# Patient Record
Sex: Male | Born: 1935 | Race: White | Hispanic: No | Smoking: Former smoker
Health system: Southern US, Community
[De-identification: ages and names within clinical notes are randomized; demographics above are authoritative.]

## PROBLEM LIST (undated history)

## (undated) DIAGNOSIS — K219 Gastro-esophageal reflux disease without esophagitis: Secondary | ICD-10-CM

## (undated) DIAGNOSIS — M4800 Spinal stenosis, site unspecified: Secondary | ICD-10-CM

## (undated) DIAGNOSIS — H269 Unspecified cataract: Secondary | ICD-10-CM

## (undated) DIAGNOSIS — C679 Malignant neoplasm of bladder, unspecified: Secondary | ICD-10-CM

## (undated) DIAGNOSIS — I10 Essential (primary) hypertension: Secondary | ICD-10-CM

## (undated) DIAGNOSIS — T7840XA Allergy, unspecified, initial encounter: Secondary | ICD-10-CM

## (undated) DIAGNOSIS — Z87442 Personal history of urinary calculi: Secondary | ICD-10-CM

## (undated) DIAGNOSIS — M109 Gout, unspecified: Secondary | ICD-10-CM

## (undated) DIAGNOSIS — I499 Cardiac arrhythmia, unspecified: Secondary | ICD-10-CM

## (undated) DIAGNOSIS — N189 Chronic kidney disease, unspecified: Secondary | ICD-10-CM

## (undated) DIAGNOSIS — C801 Malignant (primary) neoplasm, unspecified: Secondary | ICD-10-CM

## (undated) DIAGNOSIS — M1990 Unspecified osteoarthritis, unspecified site: Secondary | ICD-10-CM

## (undated) DIAGNOSIS — G629 Polyneuropathy, unspecified: Secondary | ICD-10-CM

## (undated) HISTORY — PX: TONSILLECTOMY: SUR1361

## (undated) HISTORY — DX: Allergy, unspecified, initial encounter: T78.40XA

## (undated) HISTORY — PX: LUMBAR LAMINECTOMY: SHX95

## (undated) HISTORY — PX: CERVICAL SPINE SURGERY: SHX589

## (undated) HISTORY — DX: Unspecified cataract: H26.9

## (undated) HISTORY — DX: Gout, unspecified: M10.9

## (undated) HISTORY — DX: Unspecified osteoarthritis, unspecified site: M19.90

## (undated) HISTORY — PX: CHOLECYSTECTOMY: SHX55

## (undated) HISTORY — PX: EYE SURGERY: SHX253

## (undated) HISTORY — PX: JOINT REPLACEMENT: SHX530

## (undated) HISTORY — DX: Malignant neoplasm of bladder, unspecified: C67.9

## (undated) HISTORY — PX: PROSTATE SURGERY: SHX751

## (undated) HISTORY — PX: BACK SURGERY: SHX140

## (undated) HISTORY — DX: Gastro-esophageal reflux disease without esophagitis: K21.9

## (undated) HISTORY — DX: Chronic kidney disease, unspecified: N18.9

## (undated) HISTORY — DX: Essential (primary) hypertension: I10

---

## 2004-07-11 ENCOUNTER — Other Ambulatory Visit: Payer: Self-pay

## 2004-07-11 ENCOUNTER — Emergency Department: Payer: Self-pay | Admitting: Unknown Physician Specialty

## 2005-02-20 ENCOUNTER — Ambulatory Visit: Payer: Self-pay | Admitting: Podiatry

## 2005-02-22 ENCOUNTER — Inpatient Hospital Stay: Payer: Self-pay | Admitting: Podiatry

## 2005-09-08 ENCOUNTER — Ambulatory Visit: Payer: Self-pay | Admitting: Unknown Physician Specialty

## 2005-12-10 ENCOUNTER — Ambulatory Visit: Payer: Self-pay | Admitting: Infectious Diseases

## 2006-03-23 ENCOUNTER — Ambulatory Visit: Payer: Self-pay | Admitting: Podiatry

## 2006-03-23 ENCOUNTER — Other Ambulatory Visit: Payer: Self-pay

## 2006-03-25 ENCOUNTER — Ambulatory Visit: Payer: Self-pay | Admitting: Podiatry

## 2006-10-19 ENCOUNTER — Ambulatory Visit: Payer: Self-pay | Admitting: Internal Medicine

## 2007-06-21 ENCOUNTER — Ambulatory Visit: Payer: Self-pay | Admitting: Unknown Physician Specialty

## 2007-07-01 ENCOUNTER — Inpatient Hospital Stay: Payer: Self-pay | Admitting: Unknown Physician Specialty

## 2007-07-06 ENCOUNTER — Encounter: Payer: Self-pay | Admitting: Internal Medicine

## 2007-07-11 ENCOUNTER — Encounter: Payer: Self-pay | Admitting: Internal Medicine

## 2008-11-02 ENCOUNTER — Ambulatory Visit: Payer: Self-pay | Admitting: Unknown Physician Specialty

## 2008-12-06 ENCOUNTER — Ambulatory Visit: Payer: Self-pay | Admitting: Podiatry

## 2008-12-08 ENCOUNTER — Ambulatory Visit: Payer: Self-pay | Admitting: Podiatry

## 2011-12-29 ENCOUNTER — Ambulatory Visit: Payer: Self-pay | Admitting: Unknown Physician Specialty

## 2011-12-31 LAB — PATHOLOGY REPORT

## 2014-09-28 DIAGNOSIS — K219 Gastro-esophageal reflux disease without esophagitis: Secondary | ICD-10-CM | POA: Insufficient documentation

## 2014-09-28 DIAGNOSIS — M542 Cervicalgia: Secondary | ICD-10-CM

## 2014-09-28 DIAGNOSIS — J3089 Other allergic rhinitis: Secondary | ICD-10-CM | POA: Insufficient documentation

## 2014-09-28 DIAGNOSIS — G8929 Other chronic pain: Secondary | ICD-10-CM | POA: Insufficient documentation

## 2014-09-28 DIAGNOSIS — M1000 Idiopathic gout, unspecified site: Secondary | ICD-10-CM | POA: Insufficient documentation

## 2014-09-28 DIAGNOSIS — M109 Gout, unspecified: Secondary | ICD-10-CM | POA: Insufficient documentation

## 2014-09-28 DIAGNOSIS — Z9181 History of falling: Secondary | ICD-10-CM | POA: Insufficient documentation

## 2014-09-28 DIAGNOSIS — I10 Essential (primary) hypertension: Secondary | ICD-10-CM | POA: Insufficient documentation

## 2014-09-28 DIAGNOSIS — N1832 Chronic kidney disease, stage 3b: Secondary | ICD-10-CM | POA: Insufficient documentation

## 2015-03-03 DIAGNOSIS — M5030 Other cervical disc degeneration, unspecified cervical region: Secondary | ICD-10-CM | POA: Insufficient documentation

## 2015-04-02 DIAGNOSIS — H353221 Exudative age-related macular degeneration, left eye, with active choroidal neovascularization: Secondary | ICD-10-CM | POA: Insufficient documentation

## 2015-06-07 DIAGNOSIS — H353110 Nonexudative age-related macular degeneration, right eye, stage unspecified: Secondary | ICD-10-CM | POA: Insufficient documentation

## 2015-09-03 DIAGNOSIS — I10 Essential (primary) hypertension: Secondary | ICD-10-CM | POA: Insufficient documentation

## 2016-01-04 ENCOUNTER — Other Ambulatory Visit: Payer: Self-pay | Admitting: Student

## 2016-01-04 DIAGNOSIS — M19011 Primary osteoarthritis, right shoulder: Secondary | ICD-10-CM

## 2016-01-04 DIAGNOSIS — M75101 Unspecified rotator cuff tear or rupture of right shoulder, not specified as traumatic: Secondary | ICD-10-CM

## 2016-01-11 ENCOUNTER — Ambulatory Visit
Admission: RE | Admit: 2016-01-11 | Discharge: 2016-01-11 | Disposition: A | Discharge status: Home / Self Care | Payer: Medicare Other | Source: Ambulatory Visit | Attending: Student | Admitting: Student

## 2016-01-11 DIAGNOSIS — M62511 Muscle wasting and atrophy, not elsewhere classified, right shoulder: Secondary | ICD-10-CM | POA: Diagnosis not present

## 2016-01-11 DIAGNOSIS — M75121 Complete rotator cuff tear or rupture of right shoulder, not specified as traumatic: Secondary | ICD-10-CM | POA: Diagnosis not present

## 2016-01-11 DIAGNOSIS — M19011 Primary osteoarthritis, right shoulder: Secondary | ICD-10-CM | POA: Diagnosis not present

## 2016-01-11 DIAGNOSIS — M25411 Effusion, right shoulder: Secondary | ICD-10-CM | POA: Insufficient documentation

## 2016-01-11 DIAGNOSIS — M75101 Unspecified rotator cuff tear or rupture of right shoulder, not specified as traumatic: Secondary | ICD-10-CM

## 2016-01-11 DIAGNOSIS — M66822 Spontaneous rupture of other tendons, left upper arm: Secondary | ICD-10-CM | POA: Insufficient documentation

## 2016-01-18 ENCOUNTER — Ambulatory Visit: Payer: Self-pay

## 2016-03-07 DIAGNOSIS — R202 Paresthesia of skin: Secondary | ICD-10-CM | POA: Insufficient documentation

## 2016-03-07 DIAGNOSIS — M5416 Radiculopathy, lumbar region: Secondary | ICD-10-CM | POA: Insufficient documentation

## 2016-04-24 DIAGNOSIS — R55 Syncope and collapse: Secondary | ICD-10-CM | POA: Insufficient documentation

## 2016-04-24 DIAGNOSIS — I499 Cardiac arrhythmia, unspecified: Secondary | ICD-10-CM

## 2016-04-24 DIAGNOSIS — I498 Other specified cardiac arrhythmias: Secondary | ICD-10-CM | POA: Insufficient documentation

## 2016-06-10 DIAGNOSIS — M5416 Radiculopathy, lumbar region: Secondary | ICD-10-CM | POA: Insufficient documentation

## 2016-07-16 ENCOUNTER — Other Ambulatory Visit: Payer: Self-pay | Admitting: Internal Medicine

## 2016-07-16 DIAGNOSIS — R310 Gross hematuria: Secondary | ICD-10-CM

## 2016-07-23 ENCOUNTER — Ambulatory Visit (INDEPENDENT_AMBULATORY_CARE_PROVIDER_SITE_OTHER): Payer: Medicare Other | Admitting: Urology

## 2016-07-23 ENCOUNTER — Encounter: Payer: Self-pay | Admitting: Urology

## 2016-07-23 VITALS — BP 105/58 | HR 76 | Ht 76.0 in | Wt 226.0 lb

## 2016-07-23 DIAGNOSIS — N4289 Other specified disorders of prostate: Secondary | ICD-10-CM | POA: Diagnosis not present

## 2016-07-23 DIAGNOSIS — R310 Gross hematuria: Secondary | ICD-10-CM

## 2016-07-23 DIAGNOSIS — N183 Chronic kidney disease, stage 3 unspecified: Secondary | ICD-10-CM

## 2016-07-23 LAB — URINALYSIS, COMPLETE
Bilirubin, UA: NEGATIVE
GLUCOSE, UA: NEGATIVE
Ketones, UA: NEGATIVE
LEUKOCYTES UA: NEGATIVE
Nitrite, UA: NEGATIVE
Specific Gravity, UA: 1.02 (ref 1.005–1.030)
Urobilinogen, Ur: 0.2 mg/dL (ref 0.2–1.0)
pH, UA: 5.5 (ref 5.0–7.5)

## 2016-07-23 LAB — MICROSCOPIC EXAMINATION: RBC, UA: >30 /hpf — AB (ref 0–?)

## 2016-07-23 NOTE — Progress Notes (Signed)
07/23/2016 1:00 PM   Michael Holloway [birth year] [id number]  Referring provider: Glendon Axe, MD [facility] [place], [place] [address]  Chief Complaint  Patient presents with  . Hematuria    New Patient    HPI: 83 year old male referred by for further evaluation of painless gross hematuria. He notes that the blood first started appearing in his urine approximately 10 days ago and lasted for a total of 6 days. Is since resolved. He continues to have microscopic blood in his urine today.  He denies a personal history of previous episodes of gross hematuria. He's never undergone gross hematuria workup in the past.  No urinary syptoms including dysuria.  He doe shave baseline urgency which started 6 months ago without incontinence.  He gets up 1-2x nightly to void.    He has a personal history of kidney stones, passed a stone spontaneously in 1970s but none since.    He has undergone TUNA in the office with Dr. Eliberto Ivory in the 90s.    He does smoking history, 3/4 ppd x 20 years.  Quit in 1980.    He is scheduled for noncontrast CT scan abd/ pelvis ordered by his PCP, Dr. Wallace Keller tomorrow.    He was previously on ASA 81 but stopped when he started seeing blood.    PMH: Past Medical History:  Diagnosis Date  . Arthritis   . Chronic kidney disease   . GERD (gastroesophageal reflux disease)   . Gout   . Hypertension     Surgical History: Past Surgical History:  Procedure Laterality Date  . CERVICAL SPINE SURGERY    . CHOLECYSTECTOMY    . LUMBAR LAMINECTOMY    . PROSTATE SURGERY  1990s   Dr. Eliberto Ivory ,in office procedure    Home Medications:  Allergies as of 07/23/2016      Reactions   Sulfa Antibiotics Rash      Medication List       Accurate as of 07/23/16  1:00 PM. Always use your most recent med list.          amLODipine 10 MG tablet Commonly known as:  NORVASC Take by mouth.   Biotin 10 MG Caps Take by mouth.   CENTRUM ADULTS  PO Take by mouth.   gabapentin 300 MG capsule Commonly known as:  NEURONTIN Take by mouth.   losartan 50 MG tablet Commonly known as:  COZAAR Take by mouth.   metoprolol succinate 25 MG 24 hr tablet Commonly known as:  TOPROL-XL Take by mouth.   omeprazole 20 MG capsule Commonly known as:  PRILOSEC Take by mouth.   UNABLE TO FIND AREDS II EYE VITAMINS - one capsule twice a day (generic for [facility])       Allergies:  Allergies  Allergen Reactions  . Sulfa Antibiotics Rash    Family History: Family History  Problem Relation Age of Onset  . Bladder Cancer Neg Hx   . Kidney cancer Neg Hx   . Prostate cancer Neg Hx     Social History:  reports that he has quit smoking. He has never used smokeless tobacco. He reports that he drinks alcohol. He reports that he does not use drugs.  ROS: UROLOGY Frequent Urination?: Yes Hard to postpone urination?: Yes Burning/pain with urination?: No Get up at night to urinate?: Yes Leakage of urine?: Yes Urine stream starts and stops?: No Trouble starting stream?: No Do you have to strain to urinate?: No Blood in urine?:  Yes Urinary tract infection?: No Sexually transmitted disease?: No Injury to kidneys or bladder?: No Painful intercourse?: No Weak stream?: No Erection problems?: Yes Penile pain?: No  Gastrointestinal Nausea?: No Vomiting?: No Indigestion/heartburn?: No Diarrhea?: No Constipation?: Yes  Constitutional Fever: No Night sweats?: No Weight loss?: No Fatigue?: Yes  Skin Skin rash/lesions?: No Itching?: No  Eyes Blurred vision?: Yes Double vision?: No  Ears/Nose/Throat Sore throat?: No Sinus problems?: No  Hematologic/Lymphatic Swollen glands?: No Easy bruising?: Yes  Cardiovascular Leg swelling?: No Chest pain?: No  Respiratory Cough?: No Shortness of breath?: Yes  Endocrine Excessive thirst?: Yes  Musculoskeletal Back pain?: Yes Joint pain?: No  Neurological Headaches?:  No Dizziness?: Yes  Psychologic Depression?: No Anxiety?: No  Physical Exam: BP (!) 105/58   Pulse 76   Ht 6\' 4"  (1.93 m)   Wt 226 lb (102.5 kg)   BMI 27.51 kg/m   Constitutional:  Alert and oriented, No acute distress. HEENT: [place] AT, moist mucus membranes.  Trachea midline, no masses. Cardiovascular: No clubbing, cyanosis, or edema. Respiratory: Normal respiratory effort, no increased work of breathing. GI: Abdomen is soft, nontender, nondistended, no abdominal masses GU: No CVA tenderness.  Rectal: Normal sphincter tone. Enlarged 50+ cc prostate, firmness diffusely on right-sided gland but no obvious nodules. Left side is unremarkable. Skin: No rashes, bruises or suspicious lesions. Lymph: No cervical or inguinal adenopathy. Neurologic: Grossly intact, no focal deficits, moving all 4 extremities.  Ambulating steadily with a cane. Psychiatric: Normal mood and affect.  Laboratory Data: Cr 1.7 on 07/14/16  PSA 0.31 on 09/28/14  Urinalysis UA today reviewed, greater than 30 red blood cells per high-power field, otherwise unremarkable. See Epic.  Pertinent Imaging: Noncontrast CT scan scheduled for tomorrow.  Assessment & Plan:    1. Gross hematuria We discussed the differential diagnosis for gross hematuria including nephrolithiasis, renal or upper tract tumors, bladder stones, UTIs, or bladder tumors as well as undetermined etiologies.  Per AUA guidelines, I did recommend complete microscopic hematuria evaluation including CT abdomen pelvis, possible urine cytology, and office cystoscopy.  Given his history of CAD, unable to tolerate IV contrast. As such, patient understands that his workup will be incomplete without evaluation of his collecting system/ureters. Given his age, prefer to avoid proceeding to the operating room for retrogrades. He is clear with this plan.  - Urinalysis, Complete  2. CKD (chronic kidney disease) stage 3, GFR 30-59 ml/min Creatinine stable at  baseline, 1.7.  3. Prostate asymmetry Right-sided prostatic firmness appreciated Shannon rectal exam, given his age and multiple medical comorbidities, we'll likely not pursue this Most recent PSA 2 years ago was unremarkable   Return in about 2 weeks (around 08/06/2016) for cysto, f/u CT scan (already ordered by PCP).  Hollice Espy, MD  [facility] [address] [place], [place] [address] [phone]

## 2016-07-24 ENCOUNTER — Ambulatory Visit: Payer: Medicare Other

## 2016-07-24 ENCOUNTER — Ambulatory Visit
Admission: RE | Admit: 2016-07-24 | Discharge: 2016-07-24 | Disposition: A | Discharge status: Home / Self Care | Payer: Medicare Other | Source: Ambulatory Visit | Attending: Internal Medicine | Admitting: Internal Medicine

## 2016-07-24 DIAGNOSIS — I700 Atherosclerosis of aorta: Secondary | ICD-10-CM | POA: Insufficient documentation

## 2016-07-24 DIAGNOSIS — I2510 Atherosclerotic heart disease of native coronary artery without angina pectoris: Secondary | ICD-10-CM | POA: Diagnosis not present

## 2016-07-24 DIAGNOSIS — N281 Cyst of kidney, acquired: Secondary | ICD-10-CM | POA: Insufficient documentation

## 2016-07-24 DIAGNOSIS — I77811 Abdominal aortic ectasia: Secondary | ICD-10-CM | POA: Diagnosis not present

## 2016-07-24 DIAGNOSIS — I517 Cardiomegaly: Secondary | ICD-10-CM | POA: Insufficient documentation

## 2016-07-24 DIAGNOSIS — N200 Calculus of kidney: Secondary | ICD-10-CM | POA: Insufficient documentation

## 2016-07-24 DIAGNOSIS — R310 Gross hematuria: Secondary | ICD-10-CM

## 2016-08-05 ENCOUNTER — Encounter: Payer: Self-pay | Admitting: Urology

## 2016-08-05 ENCOUNTER — Ambulatory Visit (INDEPENDENT_AMBULATORY_CARE_PROVIDER_SITE_OTHER): Payer: Medicare Other | Admitting: Urology

## 2016-08-05 VITALS — BP 109/55 | HR 75 | Ht 75.0 in | Wt 228.0 lb

## 2016-08-05 DIAGNOSIS — D494 Neoplasm of unspecified behavior of bladder: Secondary | ICD-10-CM

## 2016-08-05 LAB — URINALYSIS, COMPLETE
BILIRUBIN UA: NEGATIVE
Glucose, UA: NEGATIVE
LEUKOCYTES UA: NEGATIVE
Nitrite, UA: NEGATIVE
PH UA: 5 (ref 5.0–7.5)
SPEC GRAV UA: 1.02 (ref 1.005–1.030)
Urobilinogen, Ur: 0.2 mg/dL (ref 0.2–1.0)

## 2016-08-05 LAB — MICROSCOPIC EXAMINATION

## 2016-08-05 MED ORDER — CIPROFLOXACIN HCL 500 MG PO TABS
500.0000 mg | ORAL_TABLET | Freq: Once | ORAL | Status: AC
  Administered 2016-08-05: 500 mg via ORAL

## 2016-08-05 MED ORDER — LIDOCAINE HCL 2 % EX GEL
1.0000 "application " | Freq: Once | CUTANEOUS | Status: AC
  Administered 2016-08-05: 1 via URETHRAL

## 2016-08-05 NOTE — Progress Notes (Signed)
   08/05/16  CC: cysto  HPI:  83 year old male with gross hematuria who presents today for cystoscopy to complete his hematuria workup. He previously underwent a noncontrast CT scan is unable to tolerate IV contrast due to his elevated creatinine. He is not interested in proceeding to the operating room for bilateral retrograde pyelogram.  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    Blood pressure (!) 109/55, pulse 75, height 6\' 3"  (1.905 m), weight 228 lb (103.4 kg). NED. A&Ox3.   No respiratory distress   Abd soft, NT, ND Normal phallus with bilateral descended testicles  Cystoscopy Procedure Note  Patient identification was confirmed, informed consent was obtained, and patient was prepped using Betadine solution.  Lidocaine jelly was administered per urethral meatus.    Preoperative abx where received prior to procedure.     Pre-Procedure: - Inspection reveals a normal caliber ureteral meatus.  Procedure: The flexible cystoscope was introduced without difficulty - No urethral strictures/lesions are present. 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  Given the location of the tumor, unable to readily identify left UO. Right UO was unremarkable. - No bladder stones - Mild trabeculation   Post-Procedure: - Patient tolerated the procedure well  Assessment/ Plan:  1. Bladder tumor Treatment options were reviewed with the patient today. I recommended proceeding to the operating room for TURBT, bilateral retrograde, possible left ureteral stent placement and instillation of intravesical mitomycin.   Risks of surgery were reviewed today in detail primarily including anesthetic risks, risk of bleeding, infection, damage to bladder, bladder rupture, damage to  surrounding structures, recurrence of tumor, amongst others.  All questions were answered.  Additional options including observation were also discussed given patient's age.  Without treatment, the tumor may progress and he will likely continue to have episodic gross hematuria. As such, treatment is preferred.    He is agreeable to plan.  Hollice Espy, MD

## 2016-08-08 LAB — PLEASE NOTE

## 2016-08-08 LAB — CULTURE, URINE COMPREHENSIVE

## 2016-08-11 ENCOUNTER — Telehealth: Payer: Self-pay | Admitting: Radiology

## 2016-08-11 NOTE — Telephone Encounter (Signed)
LMOM. Need to discuss surgery information. 

## 2016-08-12 NOTE — Telephone Encounter (Signed)
Notified pt of surgery and pre-admit testing appts. Questions were answered & pt voices understanding.

## 2016-08-14 ENCOUNTER — Encounter
Admission: RE | Admit: 2016-08-14 | Discharge: 2016-08-14 | Disposition: A | Discharge status: Home / Self Care | Payer: Medicare Other | Source: Ambulatory Visit | Attending: Urology | Admitting: Urology

## 2016-08-14 DIAGNOSIS — I440 Atrioventricular block, first degree: Secondary | ICD-10-CM | POA: Diagnosis not present

## 2016-08-14 DIAGNOSIS — Z01818 Encounter for other preprocedural examination: Secondary | ICD-10-CM | POA: Insufficient documentation

## 2016-08-14 DIAGNOSIS — D494 Neoplasm of unspecified behavior of bladder: Secondary | ICD-10-CM | POA: Insufficient documentation

## 2016-08-14 DIAGNOSIS — R001 Bradycardia, unspecified: Secondary | ICD-10-CM | POA: Diagnosis not present

## 2016-08-14 DIAGNOSIS — I10 Essential (primary) hypertension: Secondary | ICD-10-CM | POA: Insufficient documentation

## 2016-08-14 HISTORY — DX: Malignant (primary) neoplasm, unspecified: C80.1

## 2016-08-14 HISTORY — DX: Polyneuropathy, unspecified: G62.9

## 2016-08-14 HISTORY — DX: Personal history of urinary calculi: Z87.442

## 2016-08-14 LAB — CBC
HEMATOCRIT: 40.4 % (ref 40.0–52.0)
HEMOGLOBIN: 13.5 g/dL (ref 13.0–18.0)
MCH: 29.2 pg (ref 26.0–34.0)
MCHC: 33.5 g/dL (ref 32.0–36.0)
MCV: 87.3 fL (ref 80.0–100.0)
Platelets: 235 10*3/uL (ref 150–440)
RBC: 4.63 MIL/uL (ref 4.40–5.90)
RDW: 13.5 % (ref 11.5–14.5)
WBC: 7.2 10*3/uL (ref 3.8–10.6)

## 2016-08-14 NOTE — Patient Instructions (Signed)
Your procedure is scheduled on: August 25, 2016 (Monday) Report to Same Day Surgery 2nd floor medical mall [facility] Entrance-take elevator on left to 2nd floor.  Check in with surgery information desk.) To find out your arrival time please call [phone] between 1PM - 3PM on August 22, 2016 (Friday)   Remember: Instructions that are not followed completely may result in serious medical risk, up to and including death, or upon the discretion of your surgeon and anesthesiologist your surgery may need to be rescheduled.    _x___ 1. Do not eat food or drink liquids after midnight. No gum chewing or  hard candies                               __x__ 2. No Alcohol for 24 hours before or after surgery.   __x__3. No Smoking for 24 prior to surgery.   ____  4. Bring all medications with you on the day of surgery if instructed.    __x__ 5. Notify your doctor if there is any change in your medical condition     (cold, fever, infections).     Do not wear jewelry, make-up, hairpins, clips or nail polish.  Do not wear lotions, powders, or perfumes. You may wear deodorant.  Do not shave 48 hours prior to surgery. Men may shave face and neck.  Do not bring valuables to the hospital.    [facility] is not responsible for any belongings or valuables.               Contacts, dentures or bridgework may not be worn into surgery.  Leave your suitcase in the car. After surgery it may be brought to your room.  For patients admitted to the hospital, discharge time is determined by your                       treatment team.   Patients discharged the day of surgery will not be allowed to drive home.  You will need someone to drive you home and stay with you the night of your procedure.    Please read over the following fact sheets that you were given:   [facility] Preparing for Surgery and or MRSA Information   _x___ Take anti-hypertensive (unless it includes a diuretic), cardiac, seizure, asthma,      anti-reflux and psychiatric medicines. These include:  1. GABAPENTIN  2. METOPROLOL  3. OMEPRAZOLE (OMEPRAZOLE AT BEDTIME ON SUNDAY  NIGHT)       ____Fleets enema or Magnesium Citrate as directed.   ___ Use CHG Soap or sage wipes as directed on instruction sheet   ____ Use inhalers on the day of surgery and bring to hospital day of surgery  ____ Stop Metformin and Janumet 2 days prior to surgery.    ____ Take 1/2 of usual insulin dose the night before surgery and none on the morning     surgery.   _x___ Follow recommendations from Cardiologist, Pulmonologist or PCP regarding          stopping Aspirin, Coumadin, Pllavix ,Eliquis, Effient, or Pradaxa, and Pletal.  X____Stop Anti-inflammatories such as Advil, Aleve, Ibuprofen, Motrin, Naproxen, Naprosyn, Goodies powders or aspirin products. OK to take Tylenol  (PATIENT HAS STOPPED TAKING ASPIRIN)  _x___ Stop supplements until after surgery.  But may continue Vitamin D, Vitamin B,       and multivitamin.   ____  Bring C-Pap to the hospital.

## 2016-08-14 NOTE — Pre-Procedure Instructions (Signed)
EKG compared with 2007

## 2016-08-24 MED ORDER — CEFAZOLIN SODIUM-DEXTROSE 2-4 GM/100ML-% IV SOLN
2.0000 g | Freq: Once | INTRAVENOUS | Status: AC
  Administered 2016-08-25: 2 g via INTRAVENOUS

## 2016-08-25 ENCOUNTER — Encounter
Admission: RE | Disposition: A | Discharge status: Home / Self Care | Payer: Self-pay | Source: Ambulatory Visit | Attending: Urology

## 2016-08-25 ENCOUNTER — Ambulatory Visit: Payer: Medicare Other | Admitting: Certified Registered Nurse Anesthetist

## 2016-08-25 ENCOUNTER — Telehealth: Payer: Self-pay | Admitting: Urology

## 2016-08-25 ENCOUNTER — Ambulatory Visit
Admission: RE | Admit: 2016-08-25 | Discharge: 2016-08-25 | Disposition: A | Discharge status: Home / Self Care | Payer: Medicare Other | Source: Ambulatory Visit | Attending: Urology | Admitting: Urology

## 2016-08-25 ENCOUNTER — Encounter: Payer: Self-pay | Admitting: *Deleted

## 2016-08-25 DIAGNOSIS — D494 Neoplasm of unspecified behavior of bladder: Secondary | ICD-10-CM

## 2016-08-25 DIAGNOSIS — N3289 Other specified disorders of bladder: Secondary | ICD-10-CM

## 2016-08-25 DIAGNOSIS — K219 Gastro-esophageal reflux disease without esophagitis: Secondary | ICD-10-CM | POA: Insufficient documentation

## 2016-08-25 DIAGNOSIS — G629 Polyneuropathy, unspecified: Secondary | ICD-10-CM | POA: Insufficient documentation

## 2016-08-25 DIAGNOSIS — N329 Bladder disorder, unspecified: Secondary | ICD-10-CM | POA: Diagnosis present

## 2016-08-25 DIAGNOSIS — M1990 Unspecified osteoarthritis, unspecified site: Secondary | ICD-10-CM | POA: Insufficient documentation

## 2016-08-25 DIAGNOSIS — N189 Chronic kidney disease, unspecified: Secondary | ICD-10-CM | POA: Insufficient documentation

## 2016-08-25 DIAGNOSIS — C677 Malignant neoplasm of urachus: Secondary | ICD-10-CM | POA: Insufficient documentation

## 2016-08-25 DIAGNOSIS — I129 Hypertensive chronic kidney disease with stage 1 through stage 4 chronic kidney disease, or unspecified chronic kidney disease: Secondary | ICD-10-CM | POA: Diagnosis not present

## 2016-08-25 DIAGNOSIS — Z87891 Personal history of nicotine dependence: Secondary | ICD-10-CM | POA: Insufficient documentation

## 2016-08-25 HISTORY — PX: CYSTOSCOPY W/ RETROGRADES: SHX1426

## 2016-08-25 HISTORY — PX: TRANSURETHRAL RESECTION OF BLADDER TUMOR WITH MITOMYCIN-C: SHX6459

## 2016-08-25 SURGERY — TRANSURETHRAL RESECTION OF BLADDER TUMOR WITH MITOMYCIN-C
Anesthesia: General | Site: Ureter | Wound class: Clean Contaminated

## 2016-08-25 MED ORDER — PROPOFOL 500 MG/50ML IV EMUL
INTRAVENOUS | Status: AC
  Filled 2016-08-25: qty 50

## 2016-08-25 MED ORDER — MIDAZOLAM HCL 2 MG/2ML IJ SOLN
INTRAMUSCULAR | Status: DC | PRN
  Administered 2016-08-25: 2 mg via INTRAVENOUS

## 2016-08-25 MED ORDER — ONDANSETRON HCL 4 MG/2ML IJ SOLN
INTRAMUSCULAR | Status: AC
  Filled 2016-08-25: qty 2

## 2016-08-25 MED ORDER — ROCURONIUM BROMIDE 50 MG/5ML IV SOLN
INTRAVENOUS | Status: AC
  Filled 2016-08-25: qty 1

## 2016-08-25 MED ORDER — OXYCODONE HCL 5 MG PO TABS: 5.0000 mg | ORAL_TABLET | Freq: Once | ORAL | Status: DC | PRN

## 2016-08-25 MED ORDER — LIDOCAINE HCL (PF) 2 % IJ SOLN
INTRAMUSCULAR | Status: AC
  Filled 2016-08-25: qty 2

## 2016-08-25 MED ORDER — DEXAMETHASONE SODIUM PHOSPHATE 10 MG/ML IJ SOLN
INTRAMUSCULAR | Status: AC
  Filled 2016-08-25: qty 1

## 2016-08-25 MED ORDER — LIDOCAINE HCL (CARDIAC) 20 MG/ML IV SOLN
INTRAVENOUS | Status: DC | PRN
  Administered 2016-08-25: 50 mg via INTRAVENOUS

## 2016-08-25 MED ORDER — FENTANYL CITRATE (PF) 100 MCG/2ML IJ SOLN: 25.0000 ug | INTRAMUSCULAR | Status: DC | PRN

## 2016-08-25 MED ORDER — FENTANYL CITRATE (PF) 100 MCG/2ML IJ SOLN
INTRAMUSCULAR | Status: DC | PRN
  Administered 2016-08-25: 50 ug via INTRAVENOUS
  Administered 2016-08-25 (×2): 25 ug via INTRAVENOUS

## 2016-08-25 MED ORDER — IOTHALAMATE MEGLUMINE 43 % IV SOLN
INTRAVENOUS | Status: DC | PRN
  Administered 2016-08-25: 15 mL

## 2016-08-25 MED ORDER — ONDANSETRON HCL 4 MG/2ML IJ SOLN
INTRAMUSCULAR | Status: DC | PRN
  Administered 2016-08-25: 4 mg via INTRAVENOUS

## 2016-08-25 MED ORDER — HYDROCODONE-ACETAMINOPHEN 5-325 MG PO TABS: 1.0000 | ORAL_TABLET | Freq: Four times a day (QID) | ORAL | 0 refills | Status: DC | PRN

## 2016-08-25 MED ORDER — DEXAMETHASONE SODIUM PHOSPHATE 10 MG/ML IJ SOLN
INTRAMUSCULAR | Status: DC | PRN
  Administered 2016-08-25: 5 mg via INTRAVENOUS

## 2016-08-25 MED ORDER — OXYCODONE HCL 5 MG/5ML PO SOLN
5.0000 mg | Freq: Once | ORAL | Status: DC | PRN
Start: 2016-08-25 — End: 2016-08-25

## 2016-08-25 MED ORDER — MIDAZOLAM HCL 2 MG/2ML IJ SOLN
INTRAMUSCULAR | Status: AC
  Filled 2016-08-25: qty 2

## 2016-08-25 MED ORDER — DOCUSATE SODIUM 100 MG PO CAPS: 100.0000 mg | ORAL_CAPSULE | Freq: Two times a day (BID) | ORAL | 0 refills | Status: DC

## 2016-08-25 MED ORDER — SUCCINYLCHOLINE CHLORIDE 20 MG/ML IJ SOLN
INTRAMUSCULAR | Status: DC | PRN
  Administered 2016-08-25: 100 mg via INTRAVENOUS

## 2016-08-25 MED ORDER — LACTATED RINGERS IV SOLN
INTRAVENOUS | Status: DC
  Administered 2016-08-25: 14:00:00 via INTRAVENOUS

## 2016-08-25 MED ORDER — MITOMYCIN CHEMO FOR BLADDER INSTILLATION 40 MG
40.0000 mg | Freq: Once | INTRAVENOUS | Status: AC
  Administered 2016-08-25: 40 mg via INTRAVESICAL
  Filled 2016-08-25: qty 40

## 2016-08-25 MED ORDER — FENTANYL CITRATE (PF) 100 MCG/2ML IJ SOLN
INTRAMUSCULAR | Status: AC
  Filled 2016-08-25: qty 2

## 2016-08-25 MED ORDER — CEFAZOLIN SODIUM-DEXTROSE 2-4 GM/100ML-% IV SOLN
INTRAVENOUS | Status: AC
  Filled 2016-08-25: qty 100

## 2016-08-25 MED ORDER — EPHEDRINE SULFATE 50 MG/ML IJ SOLN
INTRAMUSCULAR | Status: DC | PRN
  Administered 2016-08-25 (×7): 5 mg via INTRAVENOUS

## 2016-08-25 MED ORDER — PROPOFOL 10 MG/ML IV BOLUS
INTRAVENOUS | Status: DC | PRN
  Administered 2016-08-25: 50 mg via INTRAVENOUS
  Administered 2016-08-25: 150 mg via INTRAVENOUS

## 2016-08-25 SURGICAL SUPPLY — 36 items
BAG DRAIN CYSTO-URO LG1000N (MISCELLANEOUS) ×4 IMPLANT
BAG URO DRAIN 2000ML W/SPOUT (MISCELLANEOUS) ×2 IMPLANT
CATH FOL 2WAY LX 18X30 (CATHETERS) ×2 IMPLANT
CATH FOLEY 2WAY  5CC 16FR (CATHETERS)
CATH FOLEY 2WAY 5CC 16FR (CATHETERS)
CATH URETL 5X70 OPEN END (CATHETERS) ×4 IMPLANT
CATH URTH 16FR FL 2W BLN LF (CATHETERS) ×2 IMPLANT
CONRAY 43 FOR UROLOGY 50M (MISCELLANEOUS) ×4 IMPLANT
DRAPE UTILITY 15X26 TOWEL STRL (DRAPES) ×4 IMPLANT
DRSG TELFA 4X3 1S NADH ST (GAUZE/BANDAGES/DRESSINGS) ×4 IMPLANT
ELECT LOOP 22F BIPOLAR SML (ELECTROSURGICAL) ×4
ELECT REM PT RETURN 9FT ADLT (ELECTROSURGICAL)
ELECTRODE LOOP 22F BIPOLAR SML (ELECTROSURGICAL) ×3 IMPLANT
ELECTRODE REM PT RTRN 9FT ADLT (ELECTROSURGICAL) IMPLANT
GLOVE BIO SURGEON STRL SZ 6.5 (GLOVE) ×4 IMPLANT
GOWN STRL REUS W/ TWL LRG LVL3 (GOWN DISPOSABLE) ×6 IMPLANT
GOWN STRL REUS W/TWL LRG LVL3 (GOWN DISPOSABLE) ×8
KIT RM TURNOVER CYSTO AR (KITS) ×4 IMPLANT
LOOP CUT BIPOLAR 24F LRG (ELECTROSURGICAL) IMPLANT
NDL SAFETY ECLIPSE 18X1.5 (NEEDLE) ×2 IMPLANT
NEEDLE HYPO 18GX1.5 SHARP (NEEDLE)
PACK CYSTO AR (MISCELLANEOUS) ×4 IMPLANT
SCRUB POVIDONE IODINE 4 OZ (MISCELLANEOUS) ×4 IMPLANT
SENSORWIRE 0.038 NOT ANGLED (WIRE) ×4
SET CYSTO W/LG BORE CLAMP LF (SET/KITS/TRAYS/PACK) ×2 IMPLANT
SET IRRIG Y TYPE TUR BLADDER L (SET/KITS/TRAYS/PACK) ×4 IMPLANT
SET IRRIGATING DISP (SET/KITS/TRAYS/PACK) ×4 IMPLANT
SOL .9 NS 3000ML IRR  AL (IV SOLUTION) ×4
SOL .9 NS 3000ML IRR AL (IV SOLUTION) ×12
SOL .9 NS 3000ML IRR UROMATIC (IV SOLUTION) ×6 IMPLANT
STENT URET 6FRX24 CONTOUR (STENTS) IMPLANT
STENT URET 6FRX26 CONTOUR (STENTS) IMPLANT
SURGILUBE 2OZ TUBE FLIPTOP (MISCELLANEOUS) ×4 IMPLANT
SYRINGE IRR TOOMEY STRL 70CC (SYRINGE) ×4 IMPLANT
WATER STERILE IRR 1000ML POUR (IV SOLUTION) ×4 IMPLANT
WIRE SENSOR 0.038 NOT ANGLED (WIRE) ×3 IMPLANT

## 2016-08-25 NOTE — Op Note (Signed)
Date of procedure: 08/25/16  Preoperative diagnosis:  1. Bladder mass   Postoperative diagnosis:  1. same   Procedure: 1. Transurethral resection of bladder tumor, medium 2. Bilateral retrograde pyelogram 3. Instillation of intravesical mitomycin  Surgeon: Hollice Espy, MD  Anesthesia: General  Complications: None  Intraoperative findings: Spherical bladder 3 cm tumor, mostly solid on left lateral wall of the bladder, adjacent to but not involving the left UO. Unremarkable bilateral retrogrades.  EBL: Minimal  Specimens: Bladder tumor, deep base of bladder tumor  Drains: 18 French Foley catheter  Indication: Michael Holloway is a 83 y.o. patient with gross hematuria found to have a 3 cm left lateral wall bladder tumor.  After reviewing the management options for treatment, he elected to proceed with the above surgical procedure(s). We have discussed the potential benefits and risks of the procedure, side effects of the proposed treatment, the likelihood of the patient achieving the goals of the procedure, and any potential problems that might occur during the procedure or recuperation. Informed consent has been obtained.  Description of procedure:  The patient was taken to the operating room and general anesthesia was induced.  The patient was placed in the dorsal lithotomy position, prepped and draped in the usual sterile fashion, and preoperative antibiotics were administered. A preoperative time-out was performed.   A 26 French resectoscope was advanced per urethra into the bladder using a blunt angled obturator. Upon inspection of the bladder, approximate 3 cm spherical shape tumor was appreciated on the left lateral wall of the bladder with some surrounding neovascularity.  The tumor had some papillary features along with necrosis and calcification. There is a small satellite lesion less than 0.5 cm adjacent to this. The left UO couldn't be identified and was in close proximity  but not involved. A 5 French open-ended ureteral catheter was then just placed within the mouth of the UO and a gentle retrograde pyelogram was performed. This revealed a delicate appearing ureter without hydronephrosis or filling defects. Attention was then turned to the right ureteral orifice and the same exact procedure was performed. This side was also unremarkable without hydroureteronephrosis or filling defects.  Next, using the bipolar loop and saline as the resection medium, the tumor was taken down to the base. Of note, the tumor itself was relatively solid and was somewhat concerning for bladder wall involvement. Once this was completed, cold cup biopsy forceps are used to take a few deeper bites within the base of the tumor labeled as deep base of the tumor. This was passed off the field separately. The bladder tumor chips were then irrigated out of the bladder using an Ellik. Careful hemostasis was then performed.  Again, the left UO was inspected and noted to be free of any tumor or injury from resection. The bladder was then drained and the patient was cleaned and dried. An 41 French Foley catheter was then placed with 15 cc of sterile water in the balloon. He was then repositioned the supine position, reversed from anesthesia, taken to the PACU in stable condition.  40 mg of intravesical mitomycin was then instilled into the bladder. This was clamped off and allowed to dwell for 1 hour. After one hour, the medication was drained. He did maintain his Foley catheter upon discharge.  Plan: Patient will follow-up in the office either tomorrow or Wednesday for a voiding trial. He'll follow up in 1 week for pathology review.  Hollice Espy, M.D.

## 2016-08-25 NOTE — Anesthesia Procedure Notes (Signed)
Procedure Name: Intubation Date/Time: 08/25/2016 3:46 PM Performed by: Johnna Acosta Pre-anesthesia Checklist: Patient identified, Emergency Drugs available, Timeout performed, Patient being monitored and Suction available Patient Re-evaluated:Patient Re-evaluated prior to inductionOxygen Delivery Method: Circle system utilized Preoxygenation: Pre-oxygenation with 100% oxygen Intubation Type: IV induction Ventilation: Mask ventilation without difficulty Laryngoscope Size: Miller and 2 Grade View: Grade I Tube type: Oral Tube size: 7.5 mm Number of attempts: 1 Airway Equipment and Method: Stylet Placement Confirmation: ETT inserted through vocal cords under direct vision,  positive ETCO2 and breath sounds checked- equal and bilateral Secured at: 22 cm Tube secured with: Tape Dental Injury: Teeth and Oropharynx as per pre-operative assessment

## 2016-08-25 NOTE — Progress Notes (Signed)
Report to [place]. K. RN.

## 2016-08-25 NOTE — Transfer of Care (Signed)
Immediate Anesthesia Transfer of Care Note  Patient: Michael Holloway  Procedure(s) Performed: Procedure(s): TRANSURETHRAL RESECTION OF BLADDER TUMOR WITH MITOMYCIN-C (N/A) CYSTOSCOPY WITH RETROGRADE PYELOGRAM (Bilateral)  Patient Location: PACU  Anesthesia Type:General  Level of Consciousness: awake  Airway & Oxygen Therapy: Patient connected to face mask oxygen  Post-op Assessment: Post -op Vital signs reviewed and stable  Post vital signs: stable  Last Vitals:  Vitals:   08/25/16 1318 08/25/16 1659  BP: 133/64 133/60  Pulse: 60 65  Resp: 20 15  Temp: 36.5 C 36.1 C    Last Pain:  Vitals:   08/25/16 1659  TempSrc: Temporal         Complications: No apparent anesthesia complications

## 2016-08-25 NOTE — H&P (View-Only) (Signed)
   08/05/16  CC: cysto  HPI:  83 year old male with gross hematuria who presents today for cystoscopy to complete his hematuria workup. He previously underwent a noncontrast CT scan is unable to tolerate IV contrast due to his elevated creatinine. He is not interested in proceeding to the operating room for bilateral retrograde pyelogram.  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    Blood pressure (!) 109/55, pulse 75, height 6\' 3"  (1.905 m), weight 228 lb (103.4 kg). NED. A&Ox3.   No respiratory distress   Abd soft, NT, ND Normal phallus with bilateral descended testicles  Cystoscopy Procedure Note  Patient identification was confirmed, informed consent was obtained, and patient was prepped using Betadine solution.  Lidocaine jelly was administered per urethral meatus.    Preoperative abx where received prior to procedure.     Pre-Procedure: - Inspection reveals a normal caliber ureteral meatus.  Procedure: The flexible cystoscope was introduced without difficulty - No urethral strictures/lesions are present. 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  Given the location of the tumor, unable to readily identify left UO. Right UO was unremarkable. - No bladder stones - Mild trabeculation   Post-Procedure: - Patient tolerated the procedure well  Assessment/ Plan:  1. Bladder tumor Treatment options were reviewed with the patient today. I recommended proceeding to the operating room for TURBT, bilateral retrograde, possible left ureteral stent placement and instillation of intravesical mitomycin.   Risks of surgery were reviewed today in detail primarily including anesthetic risks, risk of bleeding, infection, damage to bladder, bladder rupture, damage to  surrounding structures, recurrence of tumor, amongst others.  All questions were answered.  Additional options including observation were also discussed given patient's age.  Without treatment, the tumor may progress and he will likely continue to have episodic gross hematuria. As such, treatment is preferred.    He is agreeable to plan.  Hollice Espy, MD

## 2016-08-25 NOTE — Progress Notes (Signed)
Drained bladder 5oocc of light red purple urine

## 2016-08-25 NOTE — Anesthesia Postprocedure Evaluation (Signed)
Anesthesia Post Note  Patient: MICAEL BARB  Procedure(s) Performed: Procedure(s) (LRB): TRANSURETHRAL RESECTION OF BLADDER TUMOR WITH MITOMYCIN-C (N/A) CYSTOSCOPY WITH RETROGRADE PYELOGRAM (Bilateral)  Patient location during evaluation: PACU Anesthesia Type: General Level of consciousness: awake and alert Pain management: pain level controlled Vital Signs Assessment: post-procedure vital signs reviewed and stable Respiratory status: spontaneous breathing, nonlabored ventilation, respiratory function stable and patient connected to nasal cannula oxygen Cardiovascular status: blood pressure returned to baseline and stable Postop Assessment: no signs of nausea or vomiting Anesthetic complications: no     Last Vitals:  Vitals:   08/25/16 1750 08/25/16 1753  BP: 133/64   Pulse: (!) 57 (!) 57  Resp: 15 10  Temp:  36.7 C    Last Pain:  Vitals:   08/25/16 1659  TempSrc: Temporal                 Precious Haws Sinaya [place]

## 2016-08-25 NOTE — Progress Notes (Signed)
Bottom partial teeth placed in mouth

## 2016-08-25 NOTE — Anesthesia Preprocedure Evaluation (Addendum)
Anesthesia Evaluation  Patient identified by MRN, date of birth, ID band Patient awake    Reviewed: Allergy & Precautions, H&P , NPO status , Patient's Chart, lab work & pertinent test results  History of Anesthesia Complications Negative for: history of anesthetic complications  Airway Mallampati: III  TM Distance: >3 FB Neck ROM: limited    Dental  (+) Poor Dentition, Chipped, Missing, Partial Lower   Pulmonary neg shortness of breath, former smoker,    Pulmonary exam normal breath sounds clear to auscultation       Cardiovascular Exercise Tolerance: Good hypertension, (-) angina(-) Past MI and (-) DOE Normal cardiovascular exam Rhythm:regular Rate:Normal     Neuro/Psych  Neuromuscular disease negative psych ROS   GI/Hepatic Neg liver ROS, GERD  Controlled and Medicated,  Endo/Other  negative endocrine ROS  Renal/GU CRFRenal disease     Musculoskeletal  (+) Arthritis ,   Abdominal   Peds  Hematology negative hematology ROS (+)   Anesthesia Other Findings Patient endorses baseline dizziness   Past Medical History: No date: Arthritis No date: Cancer ([place])     Comment: Basal Cell Skin Cancer No date: Chronic kidney disease No date: GERD (gastroesophageal reflux disease) No date: Gout No date: History of kidney stones No date: Hypertension No date: Neuropathy  Past Surgical History: No date: BACK SURGERY No date: CERVICAL SPINE SURGERY No date: CHOLECYSTECTOMY No date: EYE SURGERY Bilateral     Comment: Cataract Extraction with IOL No date: LUMBAR LAMINECTOMY 1990s: PROSTATE SURGERY     Comment: Dr. Eliberto Ivory ,in office procedure No date: TONSILLECTOMY     Reproductive/Obstetrics negative OB ROS                            Anesthesia Physical Anesthesia Plan  ASA: III  Anesthesia Plan: General ETT   Post-op Pain Management:    Induction:   Airway Management Planned:    Additional Equipment:   Intra-op Plan:   Post-operative Plan:   Informed Consent: I have reviewed the patients History and Physical, chart, labs and discussed the procedure including the risks, benefits and alternatives for the proposed anesthesia with the patient or authorized representative who has indicated his/her understanding and acceptance.   Dental Advisory Given  Plan Discussed with: Anesthesiologist, CRNA and Surgeon  Anesthesia Plan Comments:        Anesthesia Quick Evaluation

## 2016-08-25 NOTE — Discharge Instructions (Signed)
Transurethral Resection of Bladder Tumor (TURBT) or Bladder Biopsy   Definition:  Transurethral Resection of the Bladder Tumor is a surgical procedure used to diagnose and remove tumors within the bladder. TURBT is the most common treatment for early stage bladder cancer.  General instructions:     Your recent bladder surgery requires very little post hospital care but some definite precautions.  Despite the fact that no skin incisions were used, the area around the bladder incisions are raw and covered with scabs to promote healing and prevent bleeding. Certain precautions are needed to insure that the scabs are not disturbed over the next 2-4 weeks while the healing proceeds.  Because the raw surface inside your bladder and the irritating effects of urine you may expect frequency of urination and/or urgency (a stronger desire to urinate) and perhaps even getting up at night more often. This will usually resolve or improve slowly over the healing period. You may see some blood in your urine over the first 6 weeks. Do not be alarmed, even if the urine was clear for a while. Get off your feet and drink lots of fluids until clearing occurs. If you start to pass clots or don't improve call us.  Diet:  You may return to your normal diet immediately. Because of the raw surface of your bladder, alcohol, spicy foods, foods high in acid and drinks with caffeine may cause irritation or frequency and should be used in moderation. To keep your urine flowing freely and avoid constipation, drink plenty of fluids during the day (8-10 glasses). Tip: Avoid cranberry juice because it is very acidic.  Activity:  Your physical activity doesn't need to be restricted. However, if you are very active, you may see some blood in the urine. We suggest that you reduce your activity under the circumstances until the bleeding has stopped.  Bowels:  It is important to keep your bowels regular during the postoperative  period. Straining with bowel movements can cause bleeding. A bowel movement every other day is reasonable. Use a mild laxative if needed, such as milk of magnesia 2-3 tablespoons, or 2 Dulcolax tablets. Call if you continue to have problems. If you had been taking narcotics for pain, before, during or after your surgery, you may be constipated. Take a laxative if necessary.    Medication:  You should resume your pre-surgery medications unless told not to. In addition you may be given an antibiotic to prevent or treat infection. Antibiotics are not always necessary. All medication should be taken as prescribed until the bottles are finished unless you are having an unusual reaction to one of the drugs.   [facility] [address] [place], [place] [address] [phone]     Indwelling Urinary Catheter Care, Adult Take good care of your catheter to keep it working and to prevent problems. How to wear your catheter Attach your catheter to your leg with tape (adhesive tape) or a leg strap. Make sure it is not too tight. If you use tape, remove any bits of tape that are already on the catheter. How to wear a drainage bag You should have:  A large overnight bag.  A small leg bag. Overnight Bag  You may wear the overnight bag at any time. Always keep the bag below the level of your bladder but off the floor. When you sleep, put a clean plastic bag in a wastebasket. Then hang the bag inside the wastebasket. Leg Bag  Never wear the  leg bag at night. Always wear the leg bag below your knee. Keep the leg bag secure with a leg strap or tape. How to care for your skin  Clean the skin around the catheter at least once every day.  Shower every day. Do not take baths.  Put creams, lotions, or ointments on your genital area only as told by your doctor.  Do not use powders, sprays, or lotions on your genital area. How to clean your catheter and your  skin 1. Wash your hands with soap and water. 2. Wet a washcloth in warm water and gentle (mild) soap. 3. Use the washcloth to clean the skin where the catheter enters your body. Clean downward and wipe away from the catheter in small circles. Do not wipe toward the catheter. 4. Pat the area dry with a clean towel. Make sure to clean off all soap. How to care for your drainage bags Empty your drainage bag when it is ?- full or at least 2-3 times a day. Replace your drainage bag once a month or sooner if it starts to smell bad or look dirty. Do not clean your drainage bag unless told by your doctor. Emptying a drainage bag   Supplies Needed  Rubbing alcohol.  Gauze pad or cotton ball.  Tape or a leg strap. Steps 1. Wash your hands with soap and water. 2. Separate (detach) the bag from your leg. 3. Hold the bag over the toilet or a clean container. Keep the bag below your hips and bladder. This stops pee (urine) from going back into the tube. 4. Open the pour spout at the bottom of the bag. 5. Empty the pee into the toilet or container. Do not let the pour spout touch any surface. 6. Put rubbing alcohol on a gauze pad or cotton ball. 7. Use the gauze pad or cotton ball to clean the pour spout. 8. Close the pour spout. 9. Attach the bag to your leg with tape or a leg strap. 10. Wash your hands. Changing a drainage bag  Supplies Needed  Alcohol wipes.  A clean drainage bag.  Adhesive tape or a leg strap. Steps 1. Wash your hands with soap and water. 2. Separate the dirty bag from your leg. 3. Pinch the rubber catheter with your fingers so that pee does not spill out. 4. Separate the catheter tube from the drainage tube where these tubes connect (at the connection valve). Do not let the tubes touch any surface. 5. Clean the end of the catheter tube with an alcohol wipe. Use a different alcohol wipe to clean the end of the drainage tube. 6. Connect the catheter tube to the drainage  tube of the clean bag. 7. Attach the new bag to the leg with adhesive tape or a leg strap. 8. Wash your hands. How to prevent infection and other problems  Never pull on your catheter or try to remove it. Pulling can damage tissue in your body.  Always wash your hands before and after touching your catheter.  If a leg strap gets wet, replace it with a dry one.  Drink enough fluids to keep your pee clear or pale yellow, or as told by your doctor.  Do not let the drainage bag or tubing touch the floor.  Wear cotton underwear.  If you are male, wipe from front to back after you poop (have a bowel movement).  Check on the catheter often to make sure it works and the tubing is  not twisted. Get help if:  Your pee is cloudy.  Your pee smells unusually bad.  Your pee is not draining into the bag.  Your tube gets clogged.  Your catheter starts to leak.  Your bladder feels full. Get help right away if:  You have redness, swelling, or pain where the catheter enters your body.  You have fluid, pus, or a bad smell coming from the area where the catheter enters your body.  The area where the catheter enters your body feels warm.  You have a fever.  You have pain in your:  Stomach (abdomen).  Legs.  Lower back.  Bladder.  You see blood fill the catheter.  Your pee is pink or red.  You feel sick to your stomach (nauseous).  You throw up (vomit).  You have chills.  Your catheter gets pulled out. This information is not intended to replace advice given to you by your health care provider. Make sure you discuss any questions you have with your health care provider. Document Released: 08/23/2012 Document Revised: 03/26/2016 Document Reviewed: 10/11/2013 Elsevier Interactive Patient Education  2017 [place]   1) The drugs that you were given will stay in your system until tomorrow so for the next 24 hours you  should not:  A) Drive an automobile B) Make any legal decisions C) Drink any alcoholic beverage   2) You may resume regular meals tomorrow.  Today it is better to start with liquids and gradually work up to solid foods.  You may eat anything you prefer, but it is better to start with liquids, then soup and crackers, and gradually work up to solid foods.   3) Please notify your doctor immediately if you have any unusual bleeding, trouble breathing, redness and pain at the surgery site, drainage, fever, or pain not relieved by medication.    4) Additional Instructions:  Please contact your physician with any problems or Same Day Surgery at [phone], Monday through Friday 6 am to 4 pm, or [place] [facility] number at [phone].

## 2016-08-25 NOTE — Anesthesia Post-op Follow-up Note (Cosign Needed)
Anesthesia QCDR form completed.        

## 2016-08-25 NOTE — Interval H&P Note (Signed)
History and Physical Interval Note:  08/25/2016 3:31 PM  Michael Holloway  has presented today for surgery, with the diagnosis of BLADDER MASS,GROSS HEMATURIA  The various methods of treatment have been discussed with the patient and family. After consideration of risks, benefits and other options for treatment, the patient has consented to  Procedure(s): TRANSURETHRAL RESECTION OF BLADDER TUMOR WITH MITOMYCIN-C (N/A) CYSTOSCOPY WITH RETROGRADE PYELOGRAM (Bilateral) CYSTOSCOPY WITH STENT PLACEMENT (Bilateral) as a surgical intervention .  The patient's history has been reviewed, patient examined, no change in status, stable for surgery.  I have reviewed the patient's chart and labs.  Questions were answered to the patient's satisfaction.    [facility] CTAB   Hollice Espy

## 2016-08-25 NOTE — Telephone Encounter (Signed)
Note opened in error.    Hollice Espy, MD

## 2016-08-26 ENCOUNTER — Ambulatory Visit (INDEPENDENT_AMBULATORY_CARE_PROVIDER_SITE_OTHER): Payer: Medicare Other

## 2016-08-26 ENCOUNTER — Encounter: Payer: Self-pay | Admitting: Urology

## 2016-08-26 ENCOUNTER — Telehealth: Payer: Self-pay | Admitting: Urology

## 2016-08-26 VITALS — BP 105/58 | HR 76 | Ht 76.0 in | Wt 226.0 lb

## 2016-08-26 DIAGNOSIS — D494 Neoplasm of unspecified behavior of bladder: Secondary | ICD-10-CM | POA: Diagnosis not present

## 2016-08-26 NOTE — Telephone Encounter (Signed)
done

## 2016-08-26 NOTE — Progress Notes (Signed)
Fill and Pull Catheter Removal  Patient is present today for a catheter removal.  Patient was cleaned and prepped in a sterile fashion 224ml of sterile water/ saline was instilled into the bladder when the patient felt the urge to urinate. 73ml of water was then drained from the balloon.  A 18FR foley cath was removed from the bladder no complications were noted .  Patient as then given some time to void on their own.  Patient can void  357ml on their own after some time.  Patient tolerated well.  Preformed by: Toniann Fail, LPN   Blood pressure (!) 105/58, pulse 76, height 6\' 4"  (1.93 m), weight 226 lb (102.5 kg).

## 2016-08-26 NOTE — Telephone Encounter (Signed)
-----   Message from Hollice Espy, MD sent at 08/25/2016  5:14 PM EDT ----- Regarding: f/u Please arrange for nurse visit voiding trial Tuesday or Wednesday then follow up with me next week for pathology results.    Hollice Espy, MD

## 2016-08-27 LAB — SURGICAL PATHOLOGY

## 2016-09-02 ENCOUNTER — Ambulatory Visit: Payer: Medicare Other

## 2016-09-04 ENCOUNTER — Ambulatory Visit (INDEPENDENT_AMBULATORY_CARE_PROVIDER_SITE_OTHER): Payer: Medicare Other | Admitting: Urology

## 2016-09-04 ENCOUNTER — Encounter: Payer: Self-pay | Admitting: Urology

## 2016-09-04 VITALS — BP 94/48 | HR 73 | Ht 76.0 in | Wt 225.0 lb

## 2016-09-04 DIAGNOSIS — C672 Malignant neoplasm of lateral wall of bladder: Secondary | ICD-10-CM

## 2016-09-04 NOTE — Progress Notes (Signed)
09/04/2016 11:43 AM   Rowland Lathe 1929/01/03 [id number]  Referring provider: Glendon Axe, MD [facility] [place], [place] [address]  No chief complaint on file.   HPI: 83 year old male with newly diagnosed bladder cancer who returns today following TURBT to discuss surgical pathology.  Surgical pathology consistent with low-grade noninvasive TA disease with focal high-grade components. There is no evidence of CIS.  Intraoperatively, a spherical 3 cm tumor with no systolic component was identified in the left lateral bladder wall adjacent to but not involving the left UO. Bilateral retrograde pyelograms were unremarkable. He did also have a CT abdomen and pelvis without contrast on 07/24/2016 which is unremarkable.  Overall, he is doing quite well. He denies any significant residual urinary symptoms including no dysuria, frequency, urgency, or gross hematuria.  He does smoking history, 3/4 ppd x 20 years.  Quit in 1980.    PMH: Past Medical History:  Diagnosis Date  . Arthritis   . Cancer ([place])    Basal Cell Skin Cancer  . Chronic kidney disease   . GERD (gastroesophageal reflux disease)   . Gout   . History of kidney stones   . Hypertension   . Neuropathy     Surgical History: Past Surgical History:  Procedure Laterality Date  . BACK SURGERY    . CERVICAL SPINE SURGERY    . CHOLECYSTECTOMY    . CYSTOSCOPY W/ RETROGRADES Bilateral 08/25/2016   Procedure: CYSTOSCOPY WITH RETROGRADE PYELOGRAM;  Surgeon: Hollice Espy, MD;  Location: [facility] ORS;  Service: Urology;  Laterality: Bilateral;  . EYE SURGERY Bilateral    Cataract Extraction with IOL  . LUMBAR LAMINECTOMY    . PROSTATE SURGERY  1990s   Dr. Eliberto Ivory ,in office procedure  . TONSILLECTOMY    . TRANSURETHRAL RESECTION OF BLADDER TUMOR WITH MITOMYCIN-C N/A 08/25/2016   Procedure: TRANSURETHRAL RESECTION OF BLADDER TUMOR WITH MITOMYCIN-C;  Surgeon: Hollice Espy, MD;  Location: [facility]  ORS;  Service: Urology;  Laterality: N/A;    Home Medications:  Allergies as of 09/04/2016      Reactions   Sulfa Antibiotics Rash      Medication List       Accurate as of 09/04/16 11:43 AM. Always use your most recent med list.          allopurinol 100 MG tablet Commonly known as:  ZYLOPRIM Take 100 mg by mouth daily.   amLODipine 10 MG tablet Commonly known as:  NORVASC Take 10 mg by mouth at bedtime.   Biotin 10 MG Caps Take 10 mg by mouth daily.   CENTRUM ADULTS PO Take 1 tablet by mouth daily.   PRESERVISION AREDS 2 PO Take 1 tablet by mouth 2 (two) times daily.   diphenhydrAMINE 25 MG tablet Commonly known as:  BENADRYL Take 25 mg by mouth every 8 (eight) hours as needed for allergies.   gabapentin 300 MG capsule Commonly known as:  NEURONTIN Take 600 mg by mouth 2 (two) times daily.   losartan 50 MG tablet Commonly known as:  COZAAR Take 50 mg by mouth at bedtime.   metoprolol succinate 25 MG 24 hr tablet Commonly known as:  TOPROL-XL Take 12.5 mg by mouth daily.   omeprazole 20 MG capsule Commonly known as:  PRILOSEC Take 20 mg by mouth daily.   traMADol 50 MG tablet Commonly known as:  ULTRAM Take 50 mg by mouth 3 (three) times daily as needed for pain.  Allergies:  Allergies  Allergen Reactions  . Sulfa Antibiotics Rash    Family History: Family History  Problem Relation Age of Onset  . Bladder Cancer Neg Hx   . Kidney cancer Neg Hx   . Prostate cancer Neg Hx     Social History:  reports that he quit smoking about 38 years ago. His smoking use included Cigarettes. He smoked 0.50 packs per day. He has never used smokeless tobacco. He reports that he drinks alcohol. He reports that he does not use drugs.  ROS: UROLOGY Frequent Urination?: No Hard to postpone urination?: No Burning/pain with urination?: No Get up at night to urinate?: No Leakage of urine?: No Urine stream starts and stops?: No Trouble starting stream?:  No Do you have to strain to urinate?: No Blood in urine?: No Urinary tract infection?: No Sexually transmitted disease?: No Injury to kidneys or bladder?: No Painful intercourse?: No Weak stream?: No Erection problems?: No Penile pain?: No  Gastrointestinal Nausea?: No Vomiting?: No Indigestion/heartburn?: No Diarrhea?: No Constipation?: No  Constitutional Fever: No Night sweats?: No Weight loss?: No Fatigue?: No  Skin Skin rash/lesions?: No Itching?: No  Eyes Blurred vision?: No Double vision?: No  Ears/Nose/Throat Sore throat?: No Sinus problems?: No  Hematologic/Lymphatic Swollen glands?: No Easy bruising?: Yes  Cardiovascular Leg swelling?: No Chest pain?: No  Respiratory Cough?: No Shortness of breath?: No  Endocrine Excessive thirst?: No  Musculoskeletal Back pain?: No Joint pain?: No  Neurological Headaches?: No Dizziness?: No  Psychologic Depression?: No Anxiety?: No  Physical Exam: BP (!) 94/48   Pulse 73   Ht 6\' 4"  (1.93 m)   Wt 225 lb (102.1 kg)   BMI 27.39 kg/m   Constitutional:  Alert and oriented, No acute distress. HEENT: [place] AT, moist mucus membranes.  Trachea midline, no masses. Cardiovascular: No clubbing, cyanosis, or edema. Respiratory: Normal respiratory effort, no increased work of breathing. GI: Abdomen is soft, nontender, nondistended, no abdominal masses GU: No CVA tenderness.  Skin: No rashes, bruises or suspicious lesions. Neurologic: Grossly intact, no focal deficits, moving all 4 extremities. Psychiatric: Normal mood and affect.  Laboratory Data: Lab Results  Component Value Date   WBC 7.2 08/14/2016   HGB 13.5 08/14/2016   HCT 40.4 08/14/2016   MCV 87.3 08/14/2016   PLT 235 08/14/2016    Urinalysis    Component Value Date/Time   APPEARANCEUR Clear 08/05/2016 1437   GLUCOSEU Negative 08/05/2016 1437   BILIRUBINUR Negative 08/05/2016 1437   PROTEINUR 2+ (A) 08/05/2016 1437   NITRITE Negative  08/05/2016 1437   LEUKOCYTESUR Negative 08/05/2016 1437    Pertinent Imaging: CT abd/ pelvis without contrast on 07/24/16  IMPRESSION: Numerous bilateral cystic renal lesions, some with calcifications. These cannot be fully characterized without intravenous contrast.  Punctate bilateral nephrolithiasis.  Irregular bladder wall thickening with possible focal bladder wall mass posteriorly on the left. Cannot exclude bladder cancer. Recommend further evaluation with direct visualization given the patient's gross hematuria.  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 Diffuse aortic atherosclerosis.  Cardiomegaly, coronary artery disease.   Electronically Signed   By: Rolm Baptise M.D.   On: 07/24/2016 11:27   Assessment & Plan:    1. 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 Risk and benefits of each were discussed along with efficacy. Ultimately, he elected surveillance only which is reasonable given his age.  Return in about 3 months (around 12/04/2016) for cystoscopy.  Hollice Espy, MD  [facility] [address] [place], [place] [address] [phone]

## 2016-09-04 NOTE — Patient Instructions (Signed)
Bacillus Calmette-Guerin Live, BCG intravesical solution What is this medicine? BACILLUS CALMETTE-GUERIN LIVE, BCG (ba SIL us KAL met gay RAYN) is a bacteria solution. This medicine stimulates the immune system to ward off cancer cells. It is used to treat bladder cancer. This medicine may be used for other purposes; ask your health care provider or pharmacist if you have questions. COMMON BRAND NAME(S): Theracys, TICE BCG What should I tell my health care provider before I take this medicine? They need to know if you have any of these conditions: -aneurysm -blood in the urine -bladder biopsy within 2 weeks -fever or infection -immune system problems -leukemia -lymphoma -myasthenia gravis -need organ transplant -prosthetic device like arterial graft, artificial joint, prosthetic heart valve -recent or ongoing radiation therapy -tuberculosis -an unusual or allergic reaction to Bacillus Calmette-Guerin Live, BCG, latex, other medicines, foods, dyes, or preservatives -pregnant or trying to get pregnant -breast-feeding How should I use this medicine? This drug is given as a catheter infusion into the bladder. It is administered in a hospital or clinic by a specially trained health care professional. You will be given directions to follow before the treatment. Follow your doctor's directions carefully. Try to hold this medicine in your bladder for 2 hours after treatment. Talk to your pediatrician regarding the use of this medicine in children. Special care may be needed. Overdosage: If you think you have taken too much of this medicine contact a poison control center or emergency room at once. NOTE: This medicine is only for you. Do not share this medicine with others. What if I miss a dose? It is important not to miss your dose. Call your doctor or health care professional if you are unable to keep an appointment. What may interact with this medicine? -antibiotics -medicines to suppress  your immune system like chemotherapy agents or corticosteroids -medicine to treat tuberculosis This list may not describe all possible interactions. Give your health care provider a list of all the medicines, herbs, non-prescription drugs, or dietary supplements you use. Also tell them if you smoke, drink alcohol, or use illegal drugs. Some items may interact with your medicine. What should I watch for while using this medicine? Visit your doctor for checks on your progress. This drug may make you feel generally unwell. Contact your doctor if your symptoms last more than 2 days or if they get worse. Call your doctor right away if you have a severe or unusual symptom. Infection can be spread to others through contact with this medicine. To prevent the spread of infection follow your doctor's directions carefully after treatment. For the first 6 hours after each treatment, sit down on the toilet to urinate. After urinating, add 2 cups of bleach to the toilet bowl and let set for 15 minutes before flushing. Wash your hands before and after using the restroom. Drink water or other fluids as directed after treatment with this medicine. Do not become pregnant while taking this medicine. Women should inform their doctor if they wish to become pregnant or think they might be pregnant. There is a potential for serious side effects to an unborn child. Talk to your health care professional or pharmacist for more information. Do not breast-feed an infant while taking this medicine. What side effects may I notice from receiving this medicine? Side effects that you should report to your doctor or health care professional as soon as possible: -allergic reactions like skin rash, itching or hives, swelling of the face, lips, or tongue -signs of   notice from receiving this medicine?  Side effects that you should report to your doctor or health care professional as soon as possible:  -allergic reactions like skin rash, itching or hives, swelling of the face, lips, or tongue  -signs of infection - fever or chills, cough, sore throat, pain or difficulty passing urine  -signs of decreased red blood cells - unusually weak or tired, fainting spells,  lightheadedness  -blood in urine  -breathing problems  -cough  -eye pain, redness  -flu-like symptoms  -joint pain  -bladder-area pain for more than 2 days after treatment  -trouble passing urine or change in the amount of urine  -vomiting  -yellowing of the eyes or skin  Side effects that usually do not require medical attention (report to your doctor or health care professional if they continue or are bothersome):  -bladder spasm  -burning when passing urine within 2 days of treatment  -feel need to pass urine often or wake up at night to pass urine  -loss of appetite  This list may not describe all possible side effects. Call your doctor for medical advice about side effects. You may report side effects to FDA at 1-800-FDA-1088.  Where should I keep my medicine?  This drug is given in a hospital or clinic and will not be stored at home.  NOTE: This sheet is a summary. It may not cover all possible information. If you have questions about this medicine, talk to your doctor, pharmacist, or health care provider.   2018 Elsevier/Gold Standard (2015-05-31 10:33:35)

## 2016-10-05 DIAGNOSIS — F5104 Psychophysiologic insomnia: Secondary | ICD-10-CM | POA: Insufficient documentation

## 2016-12-05 ENCOUNTER — Ambulatory Visit (INDEPENDENT_AMBULATORY_CARE_PROVIDER_SITE_OTHER): Payer: Medicare Other | Admitting: Urology

## 2016-12-05 ENCOUNTER — Encounter: Payer: Self-pay | Admitting: Urology

## 2016-12-05 VITALS — BP 83/40 | HR 76 | Ht 76.0 in | Wt 225.0 lb

## 2016-12-05 DIAGNOSIS — C672 Malignant neoplasm of lateral wall of bladder: Secondary | ICD-10-CM

## 2016-12-05 LAB — MICROSCOPIC EXAMINATION

## 2016-12-05 LAB — URINALYSIS, COMPLETE
BILIRUBIN UA: NEGATIVE
GLUCOSE, UA: NEGATIVE
Ketones, UA: NEGATIVE
LEUKOCYTES UA: NEGATIVE
Nitrite, UA: NEGATIVE
PH UA: 6 (ref 5.0–7.5)
RBC UA: NEGATIVE
Specific Gravity, UA: 1.02 (ref 1.005–1.030)
Urobilinogen, Ur: 0.2 mg/dL (ref 0.2–1.0)

## 2016-12-05 MED ORDER — LIDOCAINE HCL 2 % EX GEL
1.0000 "application " | Freq: Once | CUTANEOUS | Status: AC
  Administered 2016-12-05: 1 via URETHRAL

## 2016-12-05 MED ORDER — CIPROFLOXACIN HCL 500 MG PO TABS
500.0000 mg | ORAL_TABLET | Freq: Once | ORAL | Status: AC
  Administered 2016-12-05: 500 mg via ORAL

## 2016-12-05 NOTE — Progress Notes (Signed)
   12/05/16  CC:  Chief Complaint  Patient presents with  . 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 He returns today for routine office cystoscopy.  Most recent upper tract imaging in the form of CT abdomen and pelvis without contrast on 318 which was unremarkable. Bilateral retrograde pyelogram in the operating room of 08/2016 also negative.  BCG was discussed but declined.  Overall, he is doing quite well. He denies any significant residual urinary symptoms including no dysuria, frequency, urgency, or gross hematuria.  He does smoking history, 3/4 ppd x 20 years. Quit in 1980.   Blood pressure (!) 83/40, pulse 76, height 6\' 4"  (1.93 m), weight 225 lb (102.1 kg). NED. A&Ox3.   No respiratory distress   Abd soft, NT, ND Normal phallus with bilateral descended testicles  Cystoscopy Procedure Note  Patient identification was confirmed, informed consent was obtained, and patient was prepped using Betadine solution.  Lidocaine jelly was administered per urethral meatus.    Preoperative abx where received prior to procedure.     Pre-Procedure: - Inspection reveals a normal caliber ureteral meatus.  Procedure: The flexible cystoscope was introduced without difficulty - No urethral strictures/lesions are present. - Enlarged prostate trilobar coaptation - Mildly elevated bladder neck - Bilateral ureteral orifices identified - Bladder mucosa  reveals no ulcers, tumors, or lesions - No bladder stones - Mild trabeculation  Retroflexion shows  intravesical protrusion the median lobe   Post-Procedure: - Patient tolerated the procedure well  Assessment/ Plan:  1. Malignant neoplasm of lateral wall of urinary bladder (HCC) NED Given size of tumor, intermediate risk Cysto in 3 months - Urinalysis, Complete - ciprofloxacin (CIPRO) tablet 500 mg; Take 1 tablet (500 mg total) by mouth once. - lidocaine  (XYLOCAINE) 2 % jelly 1 application; Place 1 application into the urethra once.   Hollice Espy, MD

## 2017-01-05 ENCOUNTER — Other Ambulatory Visit (INDEPENDENT_AMBULATORY_CARE_PROVIDER_SITE_OTHER): Payer: Self-pay | Admitting: Vascular Surgery

## 2017-01-05 DIAGNOSIS — I6523 Occlusion and stenosis of bilateral carotid arteries: Secondary | ICD-10-CM

## 2017-01-06 ENCOUNTER — Ambulatory Visit (INDEPENDENT_AMBULATORY_CARE_PROVIDER_SITE_OTHER): Payer: Medicare Other

## 2017-01-06 ENCOUNTER — Encounter (INDEPENDENT_AMBULATORY_CARE_PROVIDER_SITE_OTHER): Payer: Self-pay | Admitting: Vascular Surgery

## 2017-01-06 ENCOUNTER — Ambulatory Visit (INDEPENDENT_AMBULATORY_CARE_PROVIDER_SITE_OTHER): Payer: Medicare Other | Admitting: Vascular Surgery

## 2017-01-06 VITALS — BP 118/68 | HR 52 | Resp 16 | Wt 218.0 lb

## 2017-01-06 DIAGNOSIS — I10 Essential (primary) hypertension: Secondary | ICD-10-CM | POA: Diagnosis not present

## 2017-01-06 DIAGNOSIS — I6523 Occlusion and stenosis of bilateral carotid arteries: Secondary | ICD-10-CM

## 2017-01-06 NOTE — Progress Notes (Signed)
MRN : [id number]  Michael Holloway is a 83 y.o. (August 20, 1928) male who presents with chief complaint of  Chief Complaint  Patient presents with  . ultrasound follow up  .  History of Present Illness: patient returns in follow-up of his carotid artery disease. He is doing well without specific complaints today. He denies focal neurologic symptoms. Specifically, the patient denies amaurosis fugax, speech or swallowing difficulties, or arm or leg weakness or numbness. Carotid duplex today demonstrates stable 1-39% right ICA stenosis and 40-59% left ICA stenosis without significant progression from his previous study.  Current Outpatient Prescriptions  Medication Sig Dispense Refill  . allopurinol (ZYLOPRIM) 100 MG tablet Take 100 mg by mouth daily.    Marland Kitchen amLODipine (NORVASC) 10 MG tablet Take 10 mg by mouth at bedtime.     . Biotin 10 MG CAPS Take 10 mg by mouth daily.     . diphenhydrAMINE (BENADRYL) 25 MG tablet Take 25 mg by mouth every 8 (eight) hours as needed for allergies.    Marland Kitchen gabapentin (NEURONTIN) 300 MG capsule Take 600 mg by mouth 2 (two) times daily.     Marland Kitchen losartan (COZAAR) 50 MG tablet Take 50 mg by mouth at bedtime.     . Multiple Vitamins-Minerals (CENTRUM ADULTS PO) Take 1 tablet by mouth daily.     . Multiple Vitamins-Minerals (PRESERVISION AREDS 2 PO) Take 1 tablet by mouth 2 (two) times daily.     Marland Kitchen omeprazole (PRILOSEC) 20 MG capsule Take 20 mg by mouth daily.     . traMADol (ULTRAM) 50 MG tablet Take 50 mg by mouth 3 (three) times daily as needed for pain.    . metoprolol succinate (TOPROL-XL) 25 MG 24 hr tablet Take 12.5 mg by mouth daily.      No current facility-administered medications for this visit.     Past Medical History:  Diagnosis Date  . Arthritis   . Cancer ([place])    Basal Cell Skin Cancer  . Chronic kidney disease   . GERD (gastroesophageal reflux disease)   . Gout   . History of kidney stones   . Hypertension   . Neuropathy     Past Surgical  History:  Procedure Laterality Date  . BACK SURGERY    . CERVICAL SPINE SURGERY    . CHOLECYSTECTOMY    . CYSTOSCOPY W/ RETROGRADES Bilateral 08/25/2016   Procedure: CYSTOSCOPY WITH RETROGRADE PYELOGRAM;  Surgeon: Hollice Espy, MD;  Location: [facility] ORS;  Service: Urology;  Laterality: Bilateral;  . EYE SURGERY Bilateral    Cataract Extraction with IOL  . LUMBAR LAMINECTOMY    . PROSTATE SURGERY  1990s   Dr. Eliberto Ivory ,in office procedure  . TONSILLECTOMY    . TRANSURETHRAL RESECTION OF BLADDER TUMOR WITH MITOMYCIN-C N/A 08/25/2016   Procedure: TRANSURETHRAL RESECTION OF BLADDER TUMOR WITH MITOMYCIN-C;  Surgeon: Hollice Espy, MD;  Location: [facility] ORS;  Service: Urology;  Laterality: N/A;    Social History Social History  Substance Use Topics  . Smoking status: Former Smoker    Packs/day: 0.50    Types: Cigarettes    Quit date: 08/11/1978  . Smokeless tobacco: Never Used  . Alcohol use Yes     Comment: wine daily      Family History Family History  Problem Relation Age of Onset  . Bladder Cancer Neg Hx   . Kidney cancer Neg Hx   . Prostate cancer Neg Hx      Allergies  Allergen Reactions  .  Sulfa Antibiotics Rash     REVIEW OF SYSTEMS (Negative unless checked)  Constitutional: [] Weight loss  [] Fever  [] Chills Cardiac: [] Chest pain   [] Chest pressure   [] Palpitations   [] Shortness of breath when laying flat   [] Shortness of breath at rest   [] Shortness of breath with exertion. Vascular:  [] Pain in legs with walking   [] Pain in legs at rest   [] Pain in legs when laying flat   [] Claudication   [] Pain in feet when walking  [] Pain in feet at rest  [] Pain in feet when laying flat   [] History of DVT   [] Phlebitis   [] Swelling in legs   [] Varicose veins   [] Non-healing ulcers Pulmonary:   [] Uses home oxygen   [] Productive cough   [] Hemoptysis   [] Wheeze  [] COPD   [] Asthma Neurologic:  [x] Dizziness  [] Blackouts   [] Seizures   [] History of stroke   [] History of TIA  [] Aphasia    [] Temporary blindness   [] Dysphagia   [] Weakness or numbness in arms   [] Weakness or numbness in legs Musculoskeletal:  [x] Arthritis   [] Joint swelling   [] Joint pain   [x] Low back pain Hematologic:  [] Easy bruising  [] Easy bleeding   [] Hypercoagulable state   [] Anemic  [] Hepatitis Gastrointestinal:  [] Blood in stool   [] Vomiting blood  [] Gastroesophageal reflux/heartburn   [] Difficulty swallowing. Genitourinary:  [] Chronic kidney disease   [] Difficult urination  [] Frequent urination  [] Burning with urination   [] Blood in urine Skin:  [] Rashes   [] Ulcers   [] Wounds Psychological:  [] History of anxiety   []  History of major depression.  Physical Examination  Vitals:   01/06/17 1204 01/06/17 1205  BP: 119/63 118/68  Pulse: (!) 54 (!) 52  Resp: 16   Weight: 98.9 kg (218 lb)    Body mass index is 26.54 kg/m. Gen:  WD/WN, NAD. Appears younger than stated age. Head: [place]/AT, No temporalis wasting. Ear/Nose/Throat: Hearing grossly intact, nares w/o erythema or drainage, trachea midline Eyes: Conjunctiva clear. Sclera non-icteric Neck: Supple.  No bruit or JVD.  Pulmonary:  Good air movement, equal and clear to auscultation bilaterally.  Cardiac: RRR, normal S1, S2 Vascular:  Vessel Right Left  Radial Palpable Palpable                                    Musculoskeletal: M/S 5/5 throughout.  No deformity or atrophy.  Neurologic: CN 2-12 intact. Sensation grossly intact in extremities.  Symmetrical.  Speech is fluent. Motor exam as listed above. Psychiatric: Judgment intact, Mood & affect appropriate for pt's clinical situation. Dermatologic: No rashes or ulcers noted.  No cellulitis or open wounds.      CBC Lab Results  Component Value Date   WBC 7.2 08/14/2016   HGB 13.5 08/14/2016   HCT 40.4 08/14/2016   MCV 87.3 08/14/2016   PLT 235 08/14/2016    BMET No results found for: NA, K, CL, CO2, GLUCOSE, BUN, CREATININE, CALCIUM, GFRNONAA, GFRAA CrCl cannot be calculated  (No order found.).  COAG No results found for: INR, PROTIME  Radiology No results found.    Assessment/Plan Benign essential hypertension blood pressure control important in reducing the progression of atherosclerotic disease. On appropriate oral medications.   Carotid stenosis Carotid duplex today demonstrates stable 1-39% right ICA stenosis and 40-59% left ICA stenosis without significant progression from his previous study. He is doing well. Continue aspirin daily and plan to recheck in 1  year with carotid duplex.     Leotis Pain, MD  01/07/2017 3:48 PM    This note was created with Dragon medical transcription system.  Any errors from dictation are purely unintentional

## 2017-01-07 DIAGNOSIS — I6529 Occlusion and stenosis of unspecified carotid artery: Secondary | ICD-10-CM | POA: Insufficient documentation

## 2017-01-07 NOTE — Assessment & Plan Note (Signed)
blood pressure control important in reducing the progression of atherosclerotic disease. On appropriate oral medications.  

## 2017-01-07 NOTE — Assessment & Plan Note (Signed)
Carotid duplex today demonstrates stable 1-39% right ICA stenosis and 40-59% left ICA stenosis without significant progression from his previous study. He is doing well. Continue aspirin daily and plan to recheck in 1 year with carotid duplex.

## 2017-03-02 ENCOUNTER — Other Ambulatory Visit: Payer: Self-pay | Admitting: Physical Medicine and Rehabilitation

## 2017-03-02 DIAGNOSIS — M5416 Radiculopathy, lumbar region: Secondary | ICD-10-CM

## 2017-03-02 DIAGNOSIS — M5126 Other intervertebral disc displacement, lumbar region: Secondary | ICD-10-CM

## 2017-03-02 DIAGNOSIS — M48062 Spinal stenosis, lumbar region with neurogenic claudication: Secondary | ICD-10-CM

## 2017-03-05 ENCOUNTER — Ambulatory Visit
Admission: RE | Admit: 2017-03-05 | Discharge: 2017-03-05 | Disposition: A | Discharge status: Home / Self Care | Payer: Medicare Other | Source: Ambulatory Visit | Attending: Physical Medicine and Rehabilitation | Admitting: Physical Medicine and Rehabilitation

## 2017-03-05 DIAGNOSIS — M1288 Other specific arthropathies, not elsewhere classified, other specified site: Secondary | ICD-10-CM | POA: Insufficient documentation

## 2017-03-05 DIAGNOSIS — M5127 Other intervertebral disc displacement, lumbosacral region: Secondary | ICD-10-CM | POA: Insufficient documentation

## 2017-03-05 DIAGNOSIS — M48062 Spinal stenosis, lumbar region with neurogenic claudication: Secondary | ICD-10-CM | POA: Diagnosis present

## 2017-03-05 DIAGNOSIS — M5416 Radiculopathy, lumbar region: Secondary | ICD-10-CM | POA: Diagnosis present

## 2017-03-05 DIAGNOSIS — M5126 Other intervertebral disc displacement, lumbar region: Secondary | ICD-10-CM

## 2017-03-06 ENCOUNTER — Ambulatory Visit: Payer: Medicare Other

## 2017-03-13 ENCOUNTER — Ambulatory Visit (INDEPENDENT_AMBULATORY_CARE_PROVIDER_SITE_OTHER): Payer: Medicare Other | Admitting: Urology

## 2017-03-13 ENCOUNTER — Telehealth: Payer: Self-pay | Admitting: Urology

## 2017-03-13 ENCOUNTER — Encounter: Payer: Self-pay | Admitting: Urology

## 2017-03-13 VITALS — BP 108/56 | HR 76 | Ht 76.0 in | Wt 219.6 lb

## 2017-03-13 DIAGNOSIS — C672 Malignant neoplasm of lateral wall of bladder: Secondary | ICD-10-CM | POA: Diagnosis not present

## 2017-03-13 LAB — URINALYSIS, COMPLETE
Bilirubin, UA: NEGATIVE
Glucose, UA: NEGATIVE
Ketones, UA: NEGATIVE
LEUKOCYTES UA: NEGATIVE
NITRITE UA: NEGATIVE
PH UA: 5.5 (ref 5.0–7.5)
RBC UA: NEGATIVE
SPEC GRAV UA: 1.02 (ref 1.005–1.030)
Urobilinogen, Ur: 0.2 mg/dL (ref 0.2–1.0)

## 2017-03-13 MED ORDER — LIDOCAINE HCL 2 % EX GEL
1.0000 "application " | Freq: Once | CUTANEOUS | Status: AC
  Administered 2017-03-13: 1 via URETHRAL

## 2017-03-13 MED ORDER — CIPROFLOXACIN HCL 500 MG PO TABS
500.0000 mg | ORAL_TABLET | Freq: Once | ORAL | Status: AC
  Administered 2017-03-13: 500 mg via ORAL

## 2017-03-13 NOTE — Telephone Encounter (Signed)
Patient is coming in for a fulguration on 03-31-17 he is on a baby aspirin and was instructed to stop it 5 days prior and to arrive 30 minutes early.   Thanks, Sharyn Lull

## 2017-03-13 NOTE — Progress Notes (Signed)
   03/13/17  CC:  Chief Complaint  Patient presents with  . 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 He returns today for routine office cystoscopy.  Most recent upper tract imaging in the form of CT abdomen and pelvis without contrast on 3/18 which was unremarkable. Bilateral retrograde pyelogram in the operating room of 08/2016 also negative.  BCG was discussed but declined.  Overall, he is doing quite well. He denies any significant residual urinary symptoms including no dysuria, frequency, urgency, or gross hematuria.  He does smoking history, 3/4 ppd x 20 years. Quit in 1980.   Blood pressure (!) 83/40, pulse 76, height 6\' 4"  (1.93 m), weight 225 lb (102.1 kg). NED. A&Ox3.   No respiratory distress   Abd soft, NT, ND Normal phallus with bilateral descended testicles  Cystoscopy Procedure Note  Patient identification was confirmed, informed consent was obtained, and patient was prepped using Betadine solution.  Lidocaine jelly was administered per urethral meatus.    Preoperative abx where received prior to procedure.     Pre-Procedure: - Inspection reveals a normal caliber ureteral meatus.  Procedure: The flexible cystoscope was introduced without difficulty - No urethral strictures/lesions are present. - Enlarged prostate trilobar coaptation - Mildly elevated bladder neck - Bilateral ureteral orifices identified - Bladder mucosa to small sub-0.5 mm lesions on posterior wall of bladder concerning for early recurrence.  There is no other lesions in the bladder. - No bladder stones - Mild trabeculation  Retroflexion shows  intravesical protrusion the median lobe   Post-Procedure: - Patient tolerated the procedure well  Assessment/ Plan:  1. Malignant neoplasm of lateral wall of urinary bladder (HCC) Very small recurrence x2 on posterior bladder wall, h/o primarily LgTa TCC Discussed  findings today with the patient-very small superficial appearing lesions He was offered return to the operating room for repeat biopsy with fulguration versus office cystoscopy, fulguration.  He is most interested in office fulguration He understands that with office fulguration, no pathology will be available.  Additional risk including discomfort, inability to tolerate the procedure, infection, amongst others discussed.  All questions were answered.  Schedule office fulguration. - Urinalysis, Complete - ciprofloxacin (CIPRO) tablet 500 mg; Take 1 tablet (500 mg total) by mouth once. - lidocaine (XYLOCAINE) 2 % jelly 1 application; Place 1 application into the urethra once.  Return in about 2 weeks (around 03/27/2017) for office fulgeration of tumor.    Hollice Espy, MD

## 2017-03-31 ENCOUNTER — Ambulatory Visit (INDEPENDENT_AMBULATORY_CARE_PROVIDER_SITE_OTHER): Payer: Medicare Other | Admitting: Urology

## 2017-03-31 VITALS — BP 122/63 | HR 67 | Ht 76.0 in | Wt 217.8 lb

## 2017-03-31 DIAGNOSIS — D494 Neoplasm of unspecified behavior of bladder: Secondary | ICD-10-CM | POA: Diagnosis not present

## 2017-03-31 LAB — MICROSCOPIC EXAMINATION
Epithelial Cells (non renal): NONE SEEN /hpf (ref 0–10)
RBC, UA: NONE SEEN /hpf (ref 0–?)

## 2017-03-31 LAB — URINALYSIS, COMPLETE
Bilirubin, UA: NEGATIVE
GLUCOSE, UA: NEGATIVE
Nitrite, UA: NEGATIVE
RBC, UA: NEGATIVE
Specific Gravity, UA: 1.015 (ref 1.005–1.030)
Urobilinogen, Ur: 0.2 mg/dL (ref 0.2–1.0)
pH, UA: 5.5 (ref 5.0–7.5)

## 2017-03-31 MED ORDER — LIDOCAINE HCL 2 % IJ SOLN
50.0000 mL | Freq: Once | INTRAMUSCULAR | Status: AC
  Administered 2017-03-31: 1000 mg

## 2017-03-31 MED ORDER — LIDOCAINE HCL 2 % EX GEL
1.0000 "application " | Freq: Once | CUTANEOUS | Status: AC
  Administered 2017-03-31: 1 via URETHRAL

## 2017-03-31 MED ORDER — CIPROFLOXACIN HCL 500 MG PO TABS
500.0000 mg | ORAL_TABLET | Freq: Once | ORAL | Status: AC
  Administered 2017-03-31: 500 mg via ORAL

## 2017-03-31 NOTE — Progress Notes (Signed)
   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  Cystoscopy a few weeks ago showed a lesion on the posterior bladder wall, less than 1 cm which is concerning for local recurrence.  He is elected to proceed with office fulguration and presents today for this procedure.  Most recent upper tract imaging in the form of CT abdomen and pelvis without contrast on 3/18 which was unremarkable. Bilateral retrograde pyelogram in the operating room of 08/2016 also negative.  BCG was discussed but declined.  Overall, he is doing quite well. He denies any significant residual urinary symptoms including no dysuria, frequency, urgency, or gross hematuria.  He does smoking history, 3/4 ppd x 20 years. Quit in 1980.   Blood pressure (!) 83/40, pulse 76, height 6\' 4"  (1.93 m), weight 225 lb (102.1 kg). NED. A&Ox3.   No respiratory distress   Abd soft, NT, ND Normal phallus with bilateral descended testicles  Cystoscopy Procedure Note  Patient identification was confirmed, informed consent was obtained, and patient was prepped using Betadine solution.  Intravesical lidocaine solution was allowed to dwell for 30 minutes prior to the procedure.  Please see see my note.  Preoperative abx where received prior to procedure.     Pre-Procedure: - Inspection reveals a normal caliber ureteral meatus.  Procedure: The flexible cystoscope was introduced without difficulty - No urethral strictures/lesions are present. - Enlarged prostate trilobar coaptation - Mildly elevated bladder neck - Bilateral ureteral orifices identified - Bladder mucosa reveals multifocal low-grade appearing papillary tumors, approximately 8 all measuring approximately 0.5 cm or less on the posterior wall. - Mild trabeculation  Retroflexion shows  intravesical protrusion the median lobe  Bugbee electrocautery was used using water to  fulgurate each of these lesions using a setting of 40.  All visible lesions were fulgurated down to the base.  Approximately 8 or so lesions were treated today.  This was well-tolerated.  No additional viable tumors were identified at the end of the procedure today.  Post-Procedure: - Patient tolerated the procedure well  Assessment/ Plan:  1. Malignant neoplasm of lateral wall of urinary bladder (HCC) Multiple small recurrence x8 on posterior bladder wall, h/o primarily LgTa TCC He is status post uncomplicated fulguration today in the office which is well-tolerated Given the fairly large number of small tumors, I would like to re-scope him in 4-6 weeks to assess for any missed lesions.  If this is negative for any recurrence at that point in time, we will discuss BCG. - Urinalysis, Complete - ciprofloxacin (CIPRO) tablet 500 mg; Take 1 tablet (500 mg total) by mouth once. - lidocaine (XYLOCAINE) 2 % jelly 1 application; Place 1 application into the urethra once.  Return in about 4 weeks (around 04/28/2017) for 4-6 weeks for cystoscopy.    Hollice Espy, MD

## 2017-05-01 ENCOUNTER — Other Ambulatory Visit: Payer: Self-pay

## 2017-05-01 ENCOUNTER — Encounter: Payer: Self-pay | Admitting: Emergency Medicine

## 2017-05-01 ENCOUNTER — Emergency Department: Payer: Medicare Other

## 2017-05-01 ENCOUNTER — Emergency Department
Admission: EM | Admit: 2017-05-01 | Discharge: 2017-05-01 | Disposition: A | Discharge status: Home / Self Care | Payer: Medicare Other | Attending: Emergency Medicine | Admitting: Emergency Medicine

## 2017-05-01 DIAGNOSIS — Z79899 Other long term (current) drug therapy: Secondary | ICD-10-CM | POA: Insufficient documentation

## 2017-05-01 DIAGNOSIS — I129 Hypertensive chronic kidney disease with stage 1 through stage 4 chronic kidney disease, or unspecified chronic kidney disease: Secondary | ICD-10-CM | POA: Insufficient documentation

## 2017-05-01 DIAGNOSIS — R531 Weakness: Secondary | ICD-10-CM | POA: Insufficient documentation

## 2017-05-01 DIAGNOSIS — Z85828 Personal history of other malignant neoplasm of skin: Secondary | ICD-10-CM | POA: Insufficient documentation

## 2017-05-01 DIAGNOSIS — N183 Chronic kidney disease, stage 3 (moderate): Secondary | ICD-10-CM | POA: Diagnosis not present

## 2017-05-01 DIAGNOSIS — Z87891 Personal history of nicotine dependence: Secondary | ICD-10-CM | POA: Diagnosis not present

## 2017-05-01 LAB — BASIC METABOLIC PANEL
ANION GAP: 8 (ref 5–15)
BUN: 35 mg/dL — ABNORMAL HIGH (ref 6–20)
CO2: 24 mmol/L (ref 22–32)
Calcium: 8.9 mg/dL (ref 8.9–10.3)
Chloride: 107 mmol/L (ref 101–111)
Creatinine, Ser: 1.81 mg/dL — ABNORMAL HIGH (ref 0.61–1.24)
GFR calc Af Amer: 37 mL/min — ABNORMAL LOW (ref >60)
GFR, EST NON AFRICAN AMERICAN: 32 mL/min — AB (ref >60)
GLUCOSE: 87 mg/dL (ref 65–99)
POTASSIUM: 4 mmol/L (ref 3.5–5.1)
Sodium: 139 mmol/L (ref 135–145)

## 2017-05-01 LAB — CBC
HEMATOCRIT: 40 % (ref 40.0–52.0)
HEMOGLOBIN: 13.7 g/dL (ref 13.0–18.0)
MCH: 29.6 pg (ref 26.0–34.0)
MCHC: 34.3 g/dL (ref 32.0–36.0)
MCV: 86.5 fL (ref 80.0–100.0)
Platelets: 242 10*3/uL (ref 150–440)
RBC: 4.62 MIL/uL (ref 4.40–5.90)
RDW: 14.4 % (ref 11.5–14.5)
WBC: 6.4 10*3/uL (ref 3.8–10.6)

## 2017-05-01 LAB — TROPONIN I: Troponin I: <0.03 ng/mL (ref <0.03)

## 2017-05-01 MED ORDER — SODIUM CHLORIDE 0.9 % IV BOLUS (SEPSIS)
1000.0000 mL | Freq: Once | INTRAVENOUS | Status: AC
  Administered 2017-05-01: 1000 mL via INTRAVENOUS

## 2017-05-01 NOTE — ED Provider Notes (Signed)
[facility] Emergency Department Provider Note   ____________________________________________   I have reviewed the triage vital signs and the nursing notes.   HISTORY  Chief Complaint Weakness  History limited by: Not Limited   HPI Michael Holloway is a 83 y.o. male who presents to the emergency department today because primary care physician had concern for atrial fibrillation based on EKG performed at office today. Patient has primary concern for weakness.   LOCATION:generalized weakness DURATION:weeks TIMING: constant SEVERITY: caused him to collapse QUALITY: weakness CONTEXT: patient states that he has been feeling weak for weeks. About a week ago it was so severe that he collapsed however he denies passing out. Moved his appointment with his PCP up (had been scheduled in January) to today. At the appointment an EKG was done with automatic reading of atrial fibrillation. Patient sent to the emergency department for that reading. MODIFYING FACTORS: none ASSOCIATED SYMPTOMS: denies any chest pain or palpitations. Denies any fevers.  Per medical record review patient has a history of HTN, CKD.  Past Medical History:  Diagnosis Date  . Arthritis   . Cancer ([place])    Basal Cell Skin Cancer  . Chronic kidney disease   . GERD (gastroesophageal reflux disease)   . Gout   . History of kidney stones   . Hypertension   . Neuropathy     Patient Active Problem List   Diagnosis Date Noted  . Bladder tumor 03/31/2017  . Carotid stenosis 01/07/2017  . Gross hematuria 07/23/2016  . CKD (chronic kidney disease) stage 3, GFR 30-59 ml/min (HCC) 07/23/2016  . Lumbar radiculitis 06/10/2016  . Near syncope 04/24/2016  . Bigeminal rhythm 04/24/2016  . Paresthesia of bilateral legs 03/07/2016  . Chronic lumbar radiculopathy 03/07/2016  . Essential hypertension 09/03/2015  . Age-related macular degeneration, dry, right eye 06/07/2015  . Exudative age-related  macular degeneration, left eye, with active choroidal neovascularization ([place]) 04/02/2015  . Degenerative disc disease, cervical 03/03/2015  . 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 Other allergic rhinitis 09/28/2014  . History of recent fall 09/28/2014  . Gastroesophageal reflux disease without esophagitis 09/28/2014  . Chronic neck pain 09/28/2014  . Benign essential hypertension 09/28/2014    Past Surgical History:  Procedure Laterality Date  . BACK SURGERY    . CERVICAL SPINE SURGERY    . CHOLECYSTECTOMY    . CYSTOSCOPY W/ RETROGRADES Bilateral 08/25/2016   Procedure: CYSTOSCOPY WITH RETROGRADE PYELOGRAM;  Surgeon: Hollice Espy, MD;  Location: [facility] ORS;  Service: Urology;  Laterality: Bilateral;  . EYE SURGERY Bilateral    Cataract Extraction with IOL  . LUMBAR LAMINECTOMY    . PROSTATE SURGERY  1990s   Dr. Eliberto Ivory ,in office procedure  . TONSILLECTOMY    . TRANSURETHRAL RESECTION OF BLADDER TUMOR WITH MITOMYCIN-C N/A 08/25/2016   Procedure: TRANSURETHRAL RESECTION OF BLADDER TUMOR WITH MITOMYCIN-C;  Surgeon: Hollice Espy, MD;  Location: [facility] ORS;  Service: Urology;  Laterality: N/A;    Prior to Admission medications   Medication Sig Start Date End Date Taking? Authorizing Provider  allopurinol (ZYLOPRIM) 100 MG tablet Take 100 mg by mouth daily.    [provider]  amLODipine (NORVASC) 10 MG tablet Take 10 mg by mouth at bedtime.  07/14/16   [provider]  Biotin 10 MG CAPS Take 10 mg by mouth daily.     [provider]  diphenhydrAMINE (BENADRYL) 25 MG tablet Take 25 mg by mouth every 8 (eight) hours as needed for allergies.  [provider]  gabapentin (NEURONTIN) 300 MG capsule Take 600 mg by mouth 2 (two) times daily.  07/22/16 07/22/17  [provider]  losartan (COZAAR) 50 MG tablet Take 50 mg by mouth at bedtime.  06/30/16   [provider]  metoprolol succinate (TOPROL-XL) 25 MG 24 hr tablet Take 12.5 mg by mouth daily.   05/19/16 08/17/16  [provider]  Multiple Vitamins-Minerals (CENTRUM ADULTS PO) Take 1 tablet by mouth daily.     [provider]  Multiple Vitamins-Minerals (PRESERVISION AREDS 2 PO) Take 1 tablet by mouth 2 (two) times daily.     [provider]  omeprazole (PRILOSEC) 20 MG capsule Take 20 mg by mouth daily.  05/19/16   [provider]  traMADol (ULTRAM) 50 MG tablet Take 50 mg by mouth 3 (three) times daily as needed for pain. 06/16/16   [provider]    Allergies Sulfa antibiotics  Family History  Problem Relation Age of Onset  . Bladder Cancer Neg Hx   . Kidney cancer Neg Hx   . Prostate cancer Neg Hx     Social History Social History   Tobacco Use  . Smoking status: Former Smoker    Packs/day: 0.50    Types: Cigarettes    Last attempt to quit: 08/11/1978    Years since quitting: 38.7  . Smokeless tobacco: Never Used  Substance Use Topics  . Alcohol use: Yes    Comment: wine daily  . Drug use: No    Review of Systems Constitutional: No fever/chills. Positive for generalized weakness. Eyes: No visual changes. ENT: No sore throat. Cardiovascular: Denies chest pain. Respiratory: Denies shortness of breath. Gastrointestinal: No abdominal pain.  No nausea, no vomiting.  No diarrhea.   Genitourinary: Negative for dysuria. Musculoskeletal: Negative for back pain. Skin: Negative for rash. Neurological: Negative for headaches, focal weakness or numbness.  ____________________________________________   PHYSICAL EXAM:  VITAL SIGNS: ED Triage Vitals  Enc Vitals Group     BP 05/01/17 1100 (!) 131/49     Pulse --      Resp 05/01/17 1100 16     Temp 05/01/17 1100 97.6 F (36.4 C)     Temp Source 05/01/17 1100 Oral     SpO2 05/01/17 1100 100 %     Weight 05/01/17 1050 225 lb (102.1 kg)     Height 05/01/17 1050 6\' 4"  (1.93 m)     Head Circumference --      Peak Flow --      Pain Score 05/01/17 1050 0   Constitutional: Alert  and oriented. Well appearing and in no distress. Eyes: Conjunctivae are normal.  ENT   Head: Normocephalic and atraumatic.   Nose: No congestion/rhinnorhea.   Mouth/Throat: Mucous membranes are moist.   Neck: No stridor. Hematological/Lymphatic/Immunilogical: No cervical lymphadenopathy. Cardiovascular: Normal rate, regular rhythm.  No murmurs, rubs, or gallops.  Respiratory: Normal respiratory effort without tachypnea nor retractions. Breath sounds are clear and equal bilaterally. No wheezes/rales/rhonchi. Gastrointestinal: Soft and non tender. No rebound. No guarding.  Genitourinary: Deferred Musculoskeletal: Normal range of motion in all extremities. No lower extremity edema. Neurologic:  Normal speech and language. No gross focal neurologic deficits are appreciated.  Skin:  Skin is warm, dry and intact. No rash noted. Psychiatric: Mood and affect are normal. Speech and behavior are normal. Patient exhibits appropriate insight and judgment.  ____________________________________________    LABS (pertinent positives/negatives)  Trop <0.03 CBC wnl BMP cr 1.81  ____________________________________________  EKG  I, Nance Pear, attending physician, personally viewed and interpreted this EKG  EKG Time: 1057 Rate: 54 Rhythm: sinus bradycardia with 1st degree av block Axis: normal Intervals: qtc 381, 1st degree av block QRS: narrow ST changes: no st elevation Impression: abnormal ekg   ____________________________________________    RADIOLOGY  CXR No acute disease  ____________________________________________   PROCEDURES  Procedures  ____________________________________________   INITIAL IMPRESSION / ASSESSMENT AND PLAN / ED COURSE  Pertinent labs & imaging results that were available during my care of the patient were reviewed by me and considered in my medical decision making (see chart for details).  Patient sent to the emergency  department today because of concern for atrial fibrillation. Reviewed EKG sent from PCP's office, although EKG had automatic read of atrial fibrillation when I reviewed it there were P waves are visible in P wave and EKG is consistent with sinus bradycardia with 1st degree AV block which is consistent with previous EKG and EKG obtained here. In terms of weakness differential would be broad including anemia, infection, dehydration, electrolyte abnormality amongst other etiology. Blood work without concerning findings, patient with known CKD. Will give the patient IVFs to see if that helps the patient's symptoms. Discussed EKG findings and blood work with patient.   Patient had IVFs. Feels roughly the same. Given negative work up however do feel reasonable for patient to continue work up with PCP.  ____________________________________________   FINAL CLINICAL IMPRESSION(S) / ED DIAGNOSES  Final diagnoses:  Weakness     Note: This dictation was prepared with Dragon dictation. Any transcriptional errors that result from this process are unintentional     Nance Pear, MD 05/01/17 1423

## 2017-05-01 NOTE — Discharge Instructions (Signed)
Please seek medical attention for any high fevers, chest pain, shortness of breath, change in behavior, persistent vomiting, bloody stool or any other new or concerning symptoms.  

## 2017-05-01 NOTE — ED Triage Notes (Signed)
Patient arrives from [facility] via wheelchair, escorted by Maudie Mercury. Requesting evaluation for new onset AFIB discovered by [facility] during a routine ECG. Syncopal episode 1 week ago and has had subsequent dizziness since. +ShOB. Patient is A+Ox4, ambulates with a cane at baseline

## 2017-05-19 ENCOUNTER — Ambulatory Visit (INDEPENDENT_AMBULATORY_CARE_PROVIDER_SITE_OTHER): Payer: Medicare Other | Admitting: Urology

## 2017-05-19 ENCOUNTER — Encounter: Payer: Self-pay | Admitting: Urology

## 2017-05-19 VITALS — BP 115/62 | HR 79 | Ht 76.0 in | Wt 225.0 lb

## 2017-05-19 DIAGNOSIS — C679 Malignant neoplasm of bladder, unspecified: Secondary | ICD-10-CM | POA: Diagnosis not present

## 2017-05-19 LAB — MICROSCOPIC EXAMINATION
EPITHELIAL CELLS (NON RENAL): NONE SEEN /HPF (ref 0–10)
RBC, UA: NONE SEEN /hpf (ref 0–?)
WBC UA: NONE SEEN /HPF (ref 0–?)

## 2017-05-19 LAB — URINALYSIS, COMPLETE
BILIRUBIN UA: NEGATIVE
Glucose, UA: NEGATIVE
LEUKOCYTES UA: NEGATIVE
Nitrite, UA: NEGATIVE
PH UA: 5.5 (ref 5.0–7.5)
RBC UA: NEGATIVE
Specific Gravity, UA: 1.015 (ref 1.005–1.030)
Urobilinogen, Ur: 0.2 mg/dL (ref 0.2–1.0)

## 2017-05-19 MED ORDER — CIPROFLOXACIN HCL 500 MG PO TABS
500.0000 mg | ORAL_TABLET | Freq: Once | ORAL | Status: AC
  Administered 2017-05-19: 500 mg via ORAL

## 2017-05-19 MED ORDER — LIDOCAINE HCL 2 % EX GEL
1.0000 "application " | Freq: Once | CUTANEOUS | Status: AC
  Administered 2017-05-19: 1 via URETHRAL

## 2017-05-19 NOTE — Progress Notes (Signed)
   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  Cystoscopy 03/2017 showed evidence of small multifocal recurrences on the posterior dome of the bladder.  He underwent office fulguration of these lesions which is well-tolerated.  He returns today for routine follow up cystoscopy.    Most recent upper tract imaging in the form of CT abdomen and pelvis without contrast on 3/18 which was unremarkable. Bilateral retrograde pyelogram in the operating room of 08/2016 also negative.  BCG was discussed but declined in the past.   He does smoking history, 3/4 ppd x 20 years. Quit in 1980.   Blood pressure (!) 83/40, pulse 76, height 6\' 4"  (1.93 m), weight 225 lb (102.1 kg). NED. A&Ox3.   No respiratory distress   Abd soft, NT, ND Normal phallus with bilateral descended testicles  Cystoscopy Procedure Note  Patient identification was confirmed, informed consent was obtained, and patient was prepped using Betadine solution.  Intravesical lidocaine solution was allowed to dwell for 30 minutes prior to the procedure.  Please see see my note.  Preoperative abx where received prior to procedure.     Pre-Procedure: - Inspection reveals a normal caliber ureteral meatus.  Procedure: The flexible cystoscope was introduced without difficulty - No urethral strictures/lesions are present. - Enlarged prostate trilobar coaptation - Mildly elevated bladder neck - Bilateral ureteral orifices identified - Bladder mucosa reveals multifocal low-grade appearing papillary tumors, approximately 5-6 lesions.  In close proximity to these lesions, there is scarlike appearance of necrosis in multiple areas that were previously fulgurated. - Mild trabeculation  Retroflexion shows  intravesical protrusion the median lobe  Post-Procedure: - Patient tolerated the procedure well  Assessment/ Plan:  1. Malignant  neoplasm of lateral wall of urinary bladder (HCC) New multifocal recurrences, all appearing low-grade and superficial as previous recurrences Previously tolerated office fulguration well Offered repeat office fulguration versus proceeding to the operating room He is interested in pursuing a repeat office fulguration Will strongly recommend proceeding to the operating room if he has any further recurrences for administration of mitomycin We will discuss BCG as an option to try to prevent future recurrences - Urinalysis, Complete - ciprofloxacin (CIPRO) tablet 500 mg; Take 1 tablet (500 mg total) by mouth once. - lidocaine (XYLOCAINE) 2 % jelly 1 application; Place 1 application into the urethra once.  Return for next Friday at 11:30 for office fulgeration of bladder tumor (ask him to come ~1 hour early).    Hollice Espy, MD

## 2017-05-29 ENCOUNTER — Encounter: Payer: Self-pay | Admitting: Urology

## 2017-05-29 ENCOUNTER — Ambulatory Visit (INDEPENDENT_AMBULATORY_CARE_PROVIDER_SITE_OTHER): Payer: Medicare Other | Admitting: Urology

## 2017-05-29 VITALS — BP 117/60 | HR 76 | Ht 76.0 in | Wt 219.4 lb

## 2017-05-29 DIAGNOSIS — C679 Malignant neoplasm of bladder, unspecified: Secondary | ICD-10-CM | POA: Diagnosis not present

## 2017-05-29 LAB — URINALYSIS, COMPLETE
Bilirubin, UA: NEGATIVE
Glucose, UA: NEGATIVE
Leukocytes, UA: NEGATIVE
NITRITE UA: NEGATIVE
PH UA: 5.5 (ref 5.0–7.5)
RBC, UA: NEGATIVE
Specific Gravity, UA: 1.02 (ref 1.005–1.030)
UUROB: 0.2 mg/dL (ref 0.2–1.0)

## 2017-05-29 LAB — MICROSCOPIC EXAMINATION: EPITHELIAL CELLS (NON RENAL): NONE SEEN /HPF (ref 0–10)

## 2017-05-29 MED ORDER — CIPROFLOXACIN HCL 500 MG PO TABS: 500.0000 mg | ORAL_TABLET | Freq: Once | ORAL | Status: DC

## 2017-05-29 MED ORDER — LIDOCAINE HCL 2 % EX GEL
1.0000 "application " | Freq: Once | CUTANEOUS | Status: AC
  Administered 2017-05-29: 1 via URETHRAL

## 2017-05-29 NOTE — Patient Instructions (Signed)
Bacillus Calmette-Guerin Live, BCG intravesical solution What is this medicine? BACILLUS CALMETTE-GUERIN LIVE, BCG (ba SIL us KAL met gay RAYN) is a bacteria solution. This medicine stimulates the immune system to ward off cancer cells. It is used to treat bladder cancer. This medicine may be used for other purposes; ask your health care provider or pharmacist if you have questions. COMMON BRAND NAME(S): Theracys, TICE BCG What should I tell my health care provider before I take this medicine? They need to know if you have any of these conditions: -aneurysm -blood in the urine -bladder biopsy within 2 weeks -fever or infection -immune system problems -leukemia -lymphoma -myasthenia gravis -need organ transplant -prosthetic device like arterial graft, artificial joint, prosthetic heart valve -recent or ongoing radiation therapy -tuberculosis -an unusual or allergic reaction to Bacillus Calmette-Guerin Live, BCG, latex, other medicines, foods, dyes, or preservatives -pregnant or trying to get pregnant -breast-feeding How should I use this medicine? This drug is given as a catheter infusion into the bladder. It is administered in a hospital or clinic by a specially trained health care professional. You will be given directions to follow before the treatment. Follow your doctor's directions carefully. Try to hold this medicine in your bladder for 2 hours after treatment. Talk to your pediatrician regarding the use of this medicine in children. Special care may be needed. Overdosage: If you think you have taken too much of this medicine contact a poison control center or emergency room at once. NOTE: This medicine is only for you. Do not share this medicine with others. What if I miss a dose? It is important not to miss your dose. Call your doctor or health care professional if you are unable to keep an appointment. What may interact with this medicine? -antibiotics -medicines to suppress  your immune system like chemotherapy agents or corticosteroids -medicine to treat tuberculosis This list may not describe all possible interactions. Give your health care provider a list of all the medicines, herbs, non-prescription drugs, or dietary supplements you use. Also tell them if you smoke, drink alcohol, or use illegal drugs. Some items may interact with your medicine. What should I watch for while using this medicine? Visit your doctor for checks on your progress. This drug may make you feel generally unwell. Contact your doctor if your symptoms last more than 2 days or if they get worse. Call your doctor right away if you have a severe or unusual symptom. Infection can be spread to others through contact with this medicine. To prevent the spread of infection follow your doctor's directions carefully after treatment. For the first 6 hours after each treatment, sit down on the toilet to urinate. After urinating, add 2 cups of bleach to the toilet bowl and let set for 15 minutes before flushing. Wash your hands before and after using the restroom. Drink water or other fluids as directed after treatment with this medicine. Do not become pregnant while taking this medicine. Women should inform their doctor if they wish to become pregnant or think they might be pregnant. There is a potential for serious side effects to an unborn child. Talk to your health care professional or pharmacist for more information. Do not breast-feed an infant while taking this medicine. What side effects may I notice from receiving this medicine? Side effects that you should report to your doctor or health care professional as soon as possible: -allergic reactions like skin rash, itching or hives, swelling of the face, lips, or tongue -signs of   notice from receiving this medicine?  Side effects that you should report to your doctor or health care professional as soon as possible:  -allergic reactions like skin rash, itching or hives, swelling of the face, lips, or tongue  -signs of infection - fever or chills, cough, sore throat, pain or difficulty passing urine  -signs of decreased red blood cells - unusually weak or tired, fainting spells,  lightheadedness  -blood in urine  -breathing problems  -cough  -eye pain, redness  -flu-like symptoms  -joint pain  -bladder-area pain for more than 2 days after treatment  -trouble passing urine or change in the amount of urine  -vomiting  -yellowing of the eyes or skin  Side effects that usually do not require medical attention (report to your doctor or health care professional if they continue or are bothersome):  -bladder spasm  -burning when passing urine within 2 days of treatment  -feel need to pass urine often or wake up at night to pass urine  -loss of appetite  This list may not describe all possible side effects. Call your doctor for medical advice about side effects. You may report side effects to FDA at 1-800-FDA-1088.  Where should I keep my medicine?  This drug is given in a hospital or clinic and will not be stored at home.  NOTE: This sheet is a summary. It may not cover all possible information. If you have questions about this medicine, talk to your doctor, pharmacist, or health care provider.   2018 Elsevier/Gold Standard (2015-05-31 10:33:35)

## 2017-06-01 NOTE — Progress Notes (Signed)
   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   In 03/2017, he was noted to have small recurrences most consistent with low-grade noninvasive lesions.  Office fulguration of these on 03/31/2017 which was well-tolerated.  At the time, these were multifocal but quite small.  He returns for cystoscopy on 05/19/2017 at which time the areas of previously fulgurated tumors are able to be seen without recurrence but there are several multiple additional satellite lesions adjacent to the previously fulgurated area.  He is elected to undergo repeat office fulguration.  Most recent upper tract imaging in the form of CT abdomen and pelvis without contrast on 3/18 which was unremarkable. Bilateral retrograde pyelogram in the operating room of 08/2016 also negative.   He does smoking history, 3/4 ppd x 20 years. Quit in 1980.   Blood pressure (!) 83/40, pulse 76, height 6\' 4"  (1.93 m), weight 225 lb (102.1 kg). NED. A&Ox3.   No respiratory distress   Abd soft, NT, ND Normal phallus with bilateral descended testicles  Cystoscopy Procedure Note  Patient identification was confirmed, informed consent was obtained, and patient was prepped using Betadine solution.  Intravesical lidocaine solution was allowed to dwell for 30 minutes prior to the procedure.  Please see see my note.  Preoperative abx where received prior to procedure.     Pre-Procedure: - Inspection reveals a normal caliber ureteral meatus.  Procedure: The flexible cystoscope was introduced without difficulty - No urethral strictures/lesions are present. - Enlarged prostate trilobar coaptation - Mildly elevated bladder neck - Bilateral ureteral orifices identified - Bladder mucosa reveals multifocal low-grade appearing papillary tumors, approximately 5 all measuring approximately 0.5 cm or less on the posterior wall  adjacent to the dome.   - Mild trabeculation  Retroflexion shows  intravesical protrusion the median lobe  Bugbee electrocautery was used using water to fulgurate each of these lesions using a setting of 40.  All visible lesions were fulgurated down to the base.  Approximately 8 or so lesions were treated today.  This was well-tolerated.  No additional viable tumors were identified at the end of the procedure today.  Post-Procedure: - Patient tolerated the procedure well  Assessment/ Plan:  1. Malignant neoplasm of lateral wall of urinary bladder (HCC) Multiple small recurrence x5 on posterior bladder wall, h/o primarily LgTa TCC He is status post uncomplicated fulguration today in the office which is well-tolerated Given the fairly large number of small tumors, I would like to re-scope him in 4-6 weeks to assess for any missed lesions.  If this is negative for any recurrence at that point in time, we will discuss BCG. He is most likely agreeable with BCG.  Information was given today at the time of discharge. - Urinalysis, Complete - ciprofloxacin (CIPRO) tablet 500 mg; Take 1 tablet (500 mg total) by mouth once. - lidocaine (XYLOCAINE) 2 % jelly 1 application; Place 1 application into the urethra once.  Return in about 4 weeks (around 04/28/2017) for 4-6 weeks for cystoscopy.  Hollice Espy, MD

## 2017-06-03 ENCOUNTER — Ambulatory Visit
Admission: RE | Admit: 2017-06-03 | Discharge: 2017-06-03 | Disposition: A | Discharge status: Home / Self Care | Payer: Medicare Other | Source: Ambulatory Visit | Attending: Cardiology | Admitting: Cardiology

## 2017-06-03 ENCOUNTER — Encounter: Payer: Self-pay | Admitting: Emergency Medicine

## 2017-06-03 ENCOUNTER — Encounter
Admission: RE | Disposition: A | Discharge status: Home / Self Care | Payer: Self-pay | Source: Ambulatory Visit | Attending: Cardiology

## 2017-06-03 DIAGNOSIS — E785 Hyperlipidemia, unspecified: Secondary | ICD-10-CM | POA: Insufficient documentation

## 2017-06-03 DIAGNOSIS — G629 Polyneuropathy, unspecified: Secondary | ICD-10-CM | POA: Diagnosis not present

## 2017-06-03 DIAGNOSIS — R55 Syncope and collapse: Secondary | ICD-10-CM | POA: Insufficient documentation

## 2017-06-03 DIAGNOSIS — M109 Gout, unspecified: Secondary | ICD-10-CM | POA: Diagnosis not present

## 2017-06-03 DIAGNOSIS — Z79899 Other long term (current) drug therapy: Secondary | ICD-10-CM | POA: Insufficient documentation

## 2017-06-03 DIAGNOSIS — Z882 Allergy status to sulfonamides status: Secondary | ICD-10-CM | POA: Diagnosis not present

## 2017-06-03 DIAGNOSIS — N183 Chronic kidney disease, stage 3 (moderate): Secondary | ICD-10-CM | POA: Diagnosis not present

## 2017-06-03 DIAGNOSIS — I129 Hypertensive chronic kidney disease with stage 1 through stage 4 chronic kidney disease, or unspecified chronic kidney disease: Secondary | ICD-10-CM | POA: Diagnosis not present

## 2017-06-03 DIAGNOSIS — K219 Gastro-esophageal reflux disease without esophagitis: Secondary | ICD-10-CM | POA: Insufficient documentation

## 2017-06-03 HISTORY — PX: LOOP RECORDER INSERTION: EP1214

## 2017-06-03 SURGERY — LOOP RECORDER INSERTION
Anesthesia: LOCAL

## 2017-06-03 MED ORDER — LIDOCAINE-EPINEPHRINE (PF) 1 %-1:200000 IJ SOLN
INTRAMUSCULAR | Status: AC
  Filled 2017-06-03: qty 30

## 2017-06-03 SURGICAL SUPPLY — 2 items
LOOP REVEAL LINQSYS (Prosthesis & Implant Heart) ×1 IMPLANT
PACK LOOP INSERTION (CUSTOM PROCEDURE TRAY) ×1 IMPLANT

## 2017-06-10 DIAGNOSIS — E785 Hyperlipidemia, unspecified: Secondary | ICD-10-CM | POA: Insufficient documentation

## 2017-06-10 DIAGNOSIS — Z95818 Presence of other cardiac implants and grafts: Secondary | ICD-10-CM | POA: Insufficient documentation

## 2017-06-29 ENCOUNTER — Other Ambulatory Visit: Payer: Self-pay

## 2017-06-30 ENCOUNTER — Encounter: Payer: Self-pay | Admitting: Urology

## 2017-06-30 ENCOUNTER — Ambulatory Visit (INDEPENDENT_AMBULATORY_CARE_PROVIDER_SITE_OTHER): Payer: Medicare Other | Admitting: Urology

## 2017-06-30 VITALS — BP 115/65 | HR 70 | Wt 219.0 lb

## 2017-06-30 DIAGNOSIS — C679 Malignant neoplasm of bladder, unspecified: Secondary | ICD-10-CM | POA: Diagnosis not present

## 2017-06-30 LAB — MICROSCOPIC EXAMINATION
Epithelial Cells (non renal): NONE SEEN /hpf (ref 0–10)
RBC, UA: NONE SEEN /hpf (ref 0–?)

## 2017-06-30 LAB — URINALYSIS, COMPLETE
BILIRUBIN UA: NEGATIVE
GLUCOSE, UA: NEGATIVE
Nitrite, UA: NEGATIVE
RBC UA: NEGATIVE
SPEC GRAV UA: 1.02 (ref 1.005–1.030)
Urobilinogen, Ur: 0.2 mg/dL (ref 0.2–1.0)
pH, UA: 5.5 (ref 5.0–7.5)

## 2017-06-30 MED ORDER — CIPROFLOXACIN HCL 500 MG PO TABS
500.0000 mg | ORAL_TABLET | Freq: Once | ORAL | Status: AC
  Administered 2017-06-30: 500 mg via ORAL

## 2017-06-30 MED ORDER — LIDOCAINE HCL 2 % EX GEL
1.0000 "application " | Freq: Once | CUTANEOUS | Status: AC
  Administered 2017-06-30: 1 via URETHRAL

## 2017-06-30 NOTE — Progress Notes (Signed)
   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   In 03/2017, he was noted to have small recurrences most consistent with low-grade noninvasive lesions.  Office fulguration of these on 03/31/2017 which was well-tolerated.  At the time, these were multifocal but quite small.  He returned for cystoscopy on 05/19/2017 at which time the areas of previously fulgurated tumors are able to be seen without recurrence but there are several multiple additional satellite lesions adjacent to the previously fulgurated area.  He  elected to undergo repeat office fulguration which was well tolerated.  Most recent upper tract imaging in the form of CT abdomen and pelvis without contrast on 3/18 which was unremarkable. Bilateral retrograde pyelogram in the operating room of 08/2016 also negative.  He does smoking history, 3/4 ppd x 20 years. Quit in 1980.   Vitals:   06/30/17 0924  BP: 115/65  Pulse: 70   NED. A&Ox3.   No respiratory distress   Abd soft, NT, ND Normal phallus with bilateral descended testicles  Cystoscopy Procedure Note  Patient identification was confirmed, informed consent was obtained, and patient was prepped using Betadine solution.  Intravesical lidocaine solution was allowed to dwell for 30 minutes prior to the procedure.  Please see see my note.  Preoperative abx where received prior to procedure.     Pre-Procedure: - Inspection reveals a normal caliber ureteral meatus.  Procedure: The flexible cystoscope was introduced without difficulty - No urethral strictures/lesions are present. - Enlarged prostate trilobar coaptation - Mildly elevated bladder neck - Bilateral ureteral orifices identified - Bladder mucosa reveals multiple stellate scars on the posterior wall of the bladder without any obvious recurrence.  A few of the scars has some adherent overlying necrosis but otherwise  unremarkable. - Mild trabeculation  Retroflexion shows  intravesical protrusion the median lobe   Post-Procedure: - Patient tolerated the procedure well  Assessment/ Plan:  1. Malignant neoplasm of lateral wall of urinary bladder (HCC) Recurrent multifocal low-grade Ta TCC s/p recent office fulgeration x 2 No obvious residual diease today Risks and benefits of BCG were reviewed in detail including risk of UTI and BCG sepsis.   He understands that this agent is mostly effective and higher grade lesions, however, given his very quick recurrence and multifocal recurrence, he would be willing to try induction course.   He is agreeable with BCG.  Information was given today at the time of discharge. - Urinalysis, Complete - ciprofloxacin (CIPRO) tablet 500 mg; Take 1 tablet (500 mg total) by mouth once. - lidocaine (XYLOCAINE) 2 % jelly 1 application; Place 1 application into the urethra once.  Return for bcg x 6 (start next week) then cysto in 3 month afterwards.  Hollice Espy, MD

## 2017-06-30 NOTE — Patient Instructions (Signed)
Bacillus Calmette-Guerin Live, BCG intravesical solution What is this medicine? BACILLUS CALMETTE-GUERIN LIVE, BCG (ba SIL us KAL met gay RAYN) is a bacteria solution. This medicine stimulates the immune system to ward off cancer cells. It is used to treat bladder cancer. This medicine may be used for other purposes; ask your health care provider or pharmacist if you have questions. COMMON BRAND NAME(S): Theracys, TICE BCG What should I tell my health care provider before I take this medicine? They need to know if you have any of these conditions: -aneurysm -blood in the urine -bladder biopsy within 2 weeks -fever or infection -immune system problems -leukemia -lymphoma -myasthenia gravis -need organ transplant -prosthetic device like arterial graft, artificial joint, prosthetic heart valve -recent or ongoing radiation therapy -tuberculosis -an unusual or allergic reaction to Bacillus Calmette-Guerin Live, BCG, latex, other medicines, foods, dyes, or preservatives -pregnant or trying to get pregnant -breast-feeding How should I use this medicine? This drug is given as a catheter infusion into the bladder. It is administered in a hospital or clinic by a specially trained health care professional. You will be given directions to follow before the treatment. Follow your doctor's directions carefully. Try to hold this medicine in your bladder for 2 hours after treatment. Talk to your pediatrician regarding the use of this medicine in children. Special care may be needed. Overdosage: If you think you have taken too much of this medicine contact a poison control center or emergency room at once. NOTE: This medicine is only for you. Do not share this medicine with others. What if I miss a dose? It is important not to miss your dose. Call your doctor or health care professional if you are unable to keep an appointment. What may interact with this medicine? -antibiotics -medicines to suppress  your immune system like chemotherapy agents or corticosteroids -medicine to treat tuberculosis This list may not describe all possible interactions. Give your health care provider a list of all the medicines, herbs, non-prescription drugs, or dietary supplements you use. Also tell them if you smoke, drink alcohol, or use illegal drugs. Some items may interact with your medicine. What should I watch for while using this medicine? Visit your doctor for checks on your progress. This drug may make you feel generally unwell. Contact your doctor if your symptoms last more than 2 days or if they get worse. Call your doctor right away if you have a severe or unusual symptom. Infection can be spread to others through contact with this medicine. To prevent the spread of infection follow your doctor's directions carefully after treatment. For the first 6 hours after each treatment, sit down on the toilet to urinate. After urinating, add 2 cups of bleach to the toilet bowl and let set for 15 minutes before flushing. Wash your hands before and after using the restroom. Drink water or other fluids as directed after treatment with this medicine. Do not become pregnant while taking this medicine. Women should inform their doctor if they wish to become pregnant or think they might be pregnant. There is a potential for serious side effects to an unborn child. Talk to your health care professional or pharmacist for more information. Do not breast-feed an infant while taking this medicine. What side effects may I notice from receiving this medicine? Side effects that you should report to your doctor or health care professional as soon as possible: -allergic reactions like skin rash, itching or hives, swelling of the face, lips, or tongue -signs of   notice from receiving this medicine?  Side effects that you should report to your doctor or health care professional as soon as possible:  -allergic reactions like skin rash, itching or hives, swelling of the face, lips, or tongue  -signs of infection - fever or chills, cough, sore throat, pain or difficulty passing urine  -signs of decreased red blood cells - unusually weak or tired, fainting spells,  lightheadedness  -blood in urine  -breathing problems  -cough  -eye pain, redness  -flu-like symptoms  -joint pain  -bladder-area pain for more than 2 days after treatment  -trouble passing urine or change in the amount of urine  -vomiting  -yellowing of the eyes or skin  Side effects that usually do not require medical attention (report to your doctor or health care professional if they continue or are bothersome):  -bladder spasm  -burning when passing urine within 2 days of treatment  -feel need to pass urine often or wake up at night to pass urine  -loss of appetite  This list may not describe all possible side effects. Call your doctor for medical advice about side effects. You may report side effects to FDA at 1-800-FDA-1088.  Where should I keep my medicine?  This drug is given in a hospital or clinic and will not be stored at home.  NOTE: This sheet is a summary. It may not cover all possible information. If you have questions about this medicine, talk to your doctor, pharmacist, or health care provider.   2018 Elsevier/Gold Standard (2015-05-31 10:33:35)

## 2017-07-02 ENCOUNTER — Telehealth: Payer: Self-pay

## 2017-07-02 NOTE — Telephone Encounter (Signed)
Contact patient in reference to BCG due to shortage

## 2017-07-07 ENCOUNTER — Ambulatory Visit: Payer: Medicare Other

## 2017-07-14 ENCOUNTER — Ambulatory Visit: Payer: Medicare Other

## 2017-07-21 ENCOUNTER — Ambulatory Visit: Payer: Medicare Other

## 2017-07-28 ENCOUNTER — Ambulatory Visit: Payer: Medicare Other

## 2017-08-04 ENCOUNTER — Ambulatory Visit: Payer: Medicare Other

## 2017-08-11 ENCOUNTER — Ambulatory Visit: Payer: Medicare Other

## 2017-09-12 ENCOUNTER — Encounter: Payer: Self-pay | Admitting: Urology

## 2017-11-10 ENCOUNTER — Ambulatory Visit (INDEPENDENT_AMBULATORY_CARE_PROVIDER_SITE_OTHER): Payer: Medicare Other | Admitting: Urology

## 2017-11-10 VITALS — BP 128/56 | HR 88

## 2017-11-10 DIAGNOSIS — C679 Malignant neoplasm of bladder, unspecified: Secondary | ICD-10-CM

## 2017-11-10 DIAGNOSIS — C671 Malignant neoplasm of dome of bladder: Secondary | ICD-10-CM

## 2017-11-10 LAB — URINALYSIS, COMPLETE
BILIRUBIN UA: NEGATIVE
Glucose, UA: NEGATIVE
Ketones, UA: NEGATIVE
LEUKOCYTES UA: NEGATIVE
Nitrite, UA: NEGATIVE
RBC UA: NEGATIVE
Specific Gravity, UA: 1.015 (ref 1.005–1.030)
Urobilinogen, Ur: 0.2 mg/dL (ref 0.2–1.0)
pH, UA: 5.5 (ref 5.0–7.5)

## 2017-11-10 LAB — MICROSCOPIC EXAMINATION

## 2017-11-10 MED ORDER — CIPROFLOXACIN HCL 500 MG PO TABS
500.0000 mg | ORAL_TABLET | Freq: Once | ORAL | Status: AC
  Administered 2017-11-10: 500 mg via ORAL

## 2017-11-10 MED ORDER — LIDOCAINE HCL URETHRAL/MUCOSAL 2 % EX GEL
1.0000 "application " | Freq: Once | CUTANEOUS | Status: AC
  Administered 2017-11-10: 1 via URETHRAL

## 2017-11-10 NOTE — Progress Notes (Signed)
   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   In 03/2017, he was noted to have small recurrences most consistent with low-grade noninvasive lesions.  Office fulguration of these on 03/31/2017 which was well-tolerated.  At the time, these were multifocal but quite small.  He returned for cystoscopy on 05/19/2017 at which time the areas of previously fulgurated tumors are able to be seen without recurrence but there are several multiple additional satellite lesions adjacent to the previously fulgurated area.  He  elected to undergo repeat office fulguration which was well tolerated.  Most recent upper tract imaging in the form of CT abdomen and pelvis without contrast on 3/18 which was unremarkable. Bilateral retrograde pyelogram in the operating room of 08/2016 also negative.  He does smoking history, 3/4 ppd x 20 years. Quit in 1980.   Denies any urinary symptoms today.  Vitals:   11/10/17 1401  BP: (!) 128/56  Pulse: 88   NED. A&Ox3.   No respiratory distress   Abd soft, NT, ND Normal phallus with bilateral descended testicles  Cystoscopy Procedure Note  Patient identification was confirmed, informed consent was obtained, and patient was prepped using Betadine solution.  Intravesical lidocaine solution was allowed to dwell for 30 minutes prior to the procedure.  Please see see my note.  Preoperative abx where received prior to procedure.     Pre-Procedure: - Inspection reveals a normal caliber ureteral meatus.  Procedure: The flexible cystoscope was introduced without difficulty - No urethral strictures/lesions are present. - Enlarged prostate trilobar coaptation - Mildly elevated bladder neck - Bilateral ureteral orifices identified - Bladder mucosa 2 small lesions, less than 1 cm each on dome of bladder adjacent to other stellate scars, papillary to low grade in appearance -  Mild trabeculation  Small amount of free floating debris  Retroflexion shows  intravesical protrusion the median lobe   Post-Procedure: - Patient tolerated the procedure well  Assessment/ Plan:  1. Malignant neoplasm of lateral wall of urinary bladder (HCC) Recurrent multifocal low-grade Ta TCC  Small recurrence x 2 at dome again today Interested in office fulguration again today rather than OR since it has been well tolerated in the past - Urinalysis, Complete - ciprofloxacin (CIPRO) tablet 500 mg; Take 1 tablet (500 mg total) by mouth once. - lidocaine (XYLOCAINE) 2 % jelly 1 application; Place 1 application into the urethra once.  Return for cysto with fulgeration 7/18 at 11 (fulgeration at 11:30).  Hollice Espy, MD

## 2017-11-11 ENCOUNTER — Other Ambulatory Visit: Payer: Medicare Other | Admitting: Urology

## 2017-11-26 ENCOUNTER — Ambulatory Visit (INDEPENDENT_AMBULATORY_CARE_PROVIDER_SITE_OTHER): Payer: Medicare Other | Admitting: Urology

## 2017-11-26 VITALS — BP 107/59 | HR 76

## 2017-11-26 DIAGNOSIS — C671 Malignant neoplasm of dome of bladder: Secondary | ICD-10-CM | POA: Diagnosis not present

## 2017-11-26 LAB — MICROSCOPIC EXAMINATION

## 2017-11-26 LAB — URINALYSIS, COMPLETE
BILIRUBIN UA: NEGATIVE
Glucose, UA: NEGATIVE
Ketones, UA: NEGATIVE
Nitrite, UA: NEGATIVE
PH UA: 6 (ref 5.0–7.5)
Specific Gravity, UA: 1.02 (ref 1.005–1.030)
Urobilinogen, Ur: 0.2 mg/dL (ref 0.2–1.0)

## 2017-11-26 MED ORDER — CIPROFLOXACIN HCL 500 MG PO TABS
500.0000 mg | ORAL_TABLET | Freq: Once | ORAL | Status: AC
  Administered 2017-11-26: 500 mg via ORAL

## 2017-11-26 MED ORDER — LIDOCAINE HCL 2 % IJ SOLN
50.0000 mL | Freq: Once | INTRAMUSCULAR | Status: AC
  Administered 2017-11-26: 1000 mg

## 2017-11-26 MED ORDER — LIDOCAINE HCL URETHRAL/MUCOSAL 2 % EX GEL
1.0000 "application " | Freq: Once | CUTANEOUS | Status: AC
  Administered 2017-11-26: 1 via URETHRAL

## 2017-11-26 NOTE — Progress Notes (Signed)
In and Out Catheterization  Patient is present today for a I & O catheterization prior to Cysto Fulgeration. Patient was cleaned and prepped in a sterile fashion with betadine and Lidocaine 2% jelly was instilled into the urethra.  A 14FR cath was inserted no complications were noted , 81ml of urine return was noted, urine was yellow in color. 53ml of 2% Lidocaine was then instilled into the bladder. Catheter was removed with out difficulty.    Preformed by: Fonnie Jarvis, CMA

## 2017-11-26 NOTE — Progress Notes (Signed)
   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  In 03/2017, he was noted to have small recurrences most consistent with low-grade noninvasive lesions.  Office fulguration of these on 03/31/2017 which was well-tolerated.    He had another focal recurrence in 05/2016 and underwent repeat office fulguration.  Most recent cystoscopy on 11/10/2017 showed 2 small lesions at the dome of the bladder, one measuring impression 1 cm and 1 smaller adjacent lesion.  He elected to undergo repeat office fulguration for which she returns today.  Most recent upper tract imaging in the form of CT abdomen and pelvis without contrast on 3/18 which was unremarkable. Bilateral retrograde pyelogram in the operating room of 08/2016 also negative.  He does smoking history, 3/4 ppd x 20 years. Quit in 1980.   Blood pressure (!) 83/40, pulse 76, height 6\' 4"  (1.93 m), weight 225 lb (102.1 kg). NED. A&Ox3.  No respiratory distress  Abd soft, NT, ND Normal phallus with bilateral descended testicles  Cystoscopy Procedure Note  Patient identification was confirmed, informed consent was obtained, and patient was prepped using Betadine solution.  Intravesical lidocaine solution was allowed to dwell for 30 minutes prior to the procedure. Please see see my note.  Preoperative abx where received prior to procedure.    Pre-Procedure: - Inspection reveals a normal caliber ureteral meatus.  Procedure: The flexible cystoscope was introduced without difficulty - No urethral strictures/lesions are present. - Enlargedprostate trilobar coaptation - Mildly elevatedbladder neck - Bilateral ureteral orifices identified - Bladder mucosareveals multifocal low-grade appearing papillary tumors, approximately 2 small lesions, the largest of which had a spherical appearance measuring approximately 1 cm and the smaller  one approximately 5 mm adjacent to this which had a flatter type appearance with papillary change on the posterior wall adjacent to the dome.   - Mild trabeculation  Retroflexion shows intravesical protrusion the median lobe  Bugbee electrocautery was used using water to fulgurate each of these lesions using a setting of30. All visible lesions were fulgurated down to the base. This was well-tolerated. No additional viable tumors were identified at the end of the procedure today.  Post-Procedure: - Patient tolerated the procedure well  Assessment/ Plan:  1. Malignant neoplasm of lateral wall of urinary bladder (HCC) Multiplesmall recurrence x2on posterior bladder wall, h/o primarily LgTa TCC He is status post uncomplicated fulguration today in the office which is well-tolerated Plan for cystoscopy again in 3 months If he continues to have small recurrences, will likely repeat upper tract imaging as well as consider biopsy of the lesions to ensure that this in fact is low-grade noninvasive disease as suspected In the light of national back order BCG, we will continue to defer this for the time being to which the patient is aggreeable - Urinalysis, Complete - ciprofloxacin (CIPRO) tablet 500 mg; Take 1 tablet (500 mg total) by mouth once. - lidocaine (XYLOCAINE) 2 % jelly 1 application; Place 1 application into the urethra once.  Return in about 3 months (around 02/26/2018) for cysto, CT scan.   Hollice Espy, MD

## 2018-01-06 ENCOUNTER — Encounter (INDEPENDENT_AMBULATORY_CARE_PROVIDER_SITE_OTHER): Payer: Self-pay | Admitting: Vascular Surgery

## 2018-01-06 ENCOUNTER — Ambulatory Visit (INDEPENDENT_AMBULATORY_CARE_PROVIDER_SITE_OTHER): Payer: Medicare Other | Admitting: Vascular Surgery

## 2018-01-06 ENCOUNTER — Ambulatory Visit (INDEPENDENT_AMBULATORY_CARE_PROVIDER_SITE_OTHER): Payer: Medicare Other

## 2018-01-06 VITALS — BP 130/74 | HR 59 | Resp 13 | Ht 76.0 in | Wt 219.0 lb

## 2018-01-06 DIAGNOSIS — I6523 Occlusion and stenosis of bilateral carotid arteries: Secondary | ICD-10-CM

## 2018-01-06 DIAGNOSIS — I10 Essential (primary) hypertension: Secondary | ICD-10-CM | POA: Diagnosis not present

## 2018-01-06 NOTE — Progress Notes (Signed)
Subjective:    Patient ID: Michael Holloway, male    DOB: 1928-07-08, 84 y.o.   MRN: [id number] Chief Complaint  Patient presents with  . Follow-up    1 year Carotid   Patient presents for a yearly non-invasive study follow up for carotid stenosis. The stenosis has been followed by surveillance duplexes. The patient underwent a bilateral carotid duplex scan which showed no change from the previous exam on 01/06/17. Duplex is stable at Right ICA stenosis (1-39%) and Left ICA stenosis (40-59%).  Bilateral vertebral arteries demonstrate antegrade flow.  Normal flow hemodynamics were seen in the bilateral subclavian arteries.  The patient denies experiencing Amaurosis Fugax, TIA like symptoms or focal motor deficits.  Patient denies any fever, nausea vomiting.  Review of Systems  Constitutional: Negative.   HENT: Negative.   Eyes: Negative.   Respiratory: Negative.   Cardiovascular: Negative.   Gastrointestinal: Negative.   Endocrine: Negative.   Genitourinary: Negative.   Musculoskeletal: Negative.   Skin: Negative.   Allergic/Immunologic: Negative.   Neurological: Negative.   Hematological: Negative.   Psychiatric/Behavioral: Negative.       Objective:   Physical Exam  Constitutional: He is oriented to person, place, and time. He appears well-developed and well-nourished. No distress.  HENT:  Head: Normocephalic and atraumatic.  Right Ear: External ear normal.  Left Ear: External ear normal.  Eyes: Pupils are equal, round, and reactive to light. Conjunctivae and EOM are normal.  Neck: Normal range of motion.  No carotid bruits noted on exam  Cardiovascular: Normal rate, regular rhythm, normal heart sounds and intact distal pulses.  Pulses:      Radial pulses are 2+ on the right side, and 2+ on the left side.  Pulmonary/Chest: Effort normal and breath sounds normal.  Musculoskeletal: Normal range of motion. He exhibits no edema.  Neurological: He is alert and oriented to  person, place, and time.  Skin: Skin is warm and dry. He is not diaphoretic.  Psychiatric: He has a normal mood and affect. His behavior is normal. Judgment and thought content normal.  Vitals reviewed.  BP 130/74 (BP Location: Left Arm, Patient Position: Sitting)   Pulse (!) 59   Resp 13   Ht 6\' 4"  (1.93 m)   Wt 219 lb (99.3 kg)   BMI 26.66 kg/m   Past Medical History:  Diagnosis Date  . Arthritis   . Cancer ([place])    Basal Cell Skin Cancer  . Chronic kidney disease   . GERD (gastroesophageal reflux disease)   . Gout   . History of kidney stones   . Hypertension   . Neuropathy    Social History   Socioeconomic History  . Marital status: Widowed    Spouse name: Not on file  . Number of children: Not on file  . Years of education: Not on file  . Highest education level: Not on file  Occupational History  . Not on file  Social Needs  . Financial resource strain: Not on file  . Food insecurity:    Worry: Not on file    Inability: Not on file  . Transportation needs:    Medical: Not on file    Non-medical: Not on file  Tobacco Use  . Smoking status: Former Smoker    Packs/day: 0.50    Types: Cigarettes    Last attempt to quit: 08/11/1978    Years since quitting: 39.4  . Smokeless tobacco: Never Used  Substance and Sexual Activity  .  Alcohol use: Yes    Comment: wine daily  . Drug use: No  . Sexual activity: Not on file  Lifestyle  . Physical activity:    Days per week: Not on file    Minutes per session: Not on file  . Stress: Not on file  Relationships  . Social connections:    Talks on phone: Not on file    Gets together: Not on file    Attends religious service: Not on file    Active member of club or organization: Not on file    Attends meetings of clubs or organizations: Not on file    Relationship status: Not on file  . Intimate partner violence:    Fear of current or ex partner: Not on file    Emotionally abused: Not on file    Physically abused:  Not on file    Forced sexual activity: Not on file  Other Topics Concern  . Not on file  Social History Narrative  . Not on file   Past Surgical History:  Procedure Laterality Date  . BACK SURGERY    . CERVICAL SPINE SURGERY    . CHOLECYSTECTOMY    . CYSTOSCOPY W/ RETROGRADES Bilateral 08/25/2016   Procedure: CYSTOSCOPY WITH RETROGRADE PYELOGRAM;  Surgeon: Hollice Espy, MD;  Location: [facility];  Service: Urology;  Laterality: Bilateral;  . EYE SURGERY Bilateral    Cataract Extraction with IOL  . LOOP RECORDER INSERTION N/A 06/03/2017   Procedure: LOOP RECORDER INSERTION;  Surgeon: Isaias Cowman, MD;  Location: [place] [facility];  Service: Cardiovascular;  Laterality: N/A;  . LUMBAR LAMINECTOMY    . PROSTATE SURGERY  1990s   Dr. Eliberto Ivory ,in office procedure  . TONSILLECTOMY    . TRANSURETHRAL RESECTION OF BLADDER TUMOR WITH MITOMYCIN-C N/A 08/25/2016   Procedure: TRANSURETHRAL RESECTION OF BLADDER TUMOR WITH MITOMYCIN-C;  Surgeon: Hollice Espy, MD;  Location: [facility];  Service: Urology;  Laterality: N/A;   Family History  Problem Relation Age of Onset  . Bladder Cancer Neg Hx   . Kidney cancer Neg Hx   . Prostate cancer Neg Hx    Allergies  Allergen Reactions  . Sulfa Antibiotics Rash      Assessment & Plan:  Patient presents for a yearly non-invasive study follow up for carotid stenosis. The stenosis has been followed by surveillance duplexes. The patient underwent a bilateral carotid duplex scan which showed no change from the previous exam on 01/06/17. Duplex is stable at Right ICA stenosis (1-39%) and Left ICA stenosis (40-59%).  Bilateral vertebral arteries demonstrate antegrade flow.  Normal flow hemodynamics were seen in the bilateral subclavian arteries.  The patient denies experiencing Amaurosis Fugax, TIA like symptoms or focal motor deficits.  Patient denies any fever, nausea vomiting.  1. Bilateral carotid artery stenosis - Stable Studies reviewed with  patient. Patient asymptomatic with stable duplex. No intervention at this time. Patient to return in one year for surveillance carotid duplex. Patient to remain abstinent of tobacco use. I have discussed with the patient at length the risk factors for and pathogenesis of atherosclerotic disease and encouraged a healthy diet, regular exercise regimen and blood pressure / glucose control.  Patient was instructed to contact our office in the interim with problems such as arm / leg weakness or numbness, speech / swallowing difficulty or temporary monocular blindness. The patient expresses their understanding.  - VAS US CAROTID; Future  2. Essential hypertension - Stable Encouraged good control as its slows the progression  of atherosclerotic disease  Current Outpatient Medications on File Prior to Visit  Medication Sig Dispense Refill  . allopurinol (ZYLOPRIM) 100 MG tablet Take 100 mg by mouth daily.    Marland Kitchen amLODipine (NORVASC) 10 MG tablet Take 5 mg by mouth at bedtime.     . Biotin 10 MG CAPS Take 10 mg by mouth daily.     . diphenhydrAMINE (BENADRYL) 25 MG tablet Take 25 mg by mouth every 8 (eight) hours as needed for allergies.    Marland Kitchen losartan (COZAAR) 50 MG tablet Take 50 mg by mouth at bedtime.     . Multiple Vitamins-Minerals (CENTRUM ADULTS PO) Take 1 tablet by mouth daily.     . Multiple Vitamins-Minerals (PRESERVISION AREDS 2 PO) Take 1 tablet by mouth 2 (two) times daily.     Marland Kitchen omeprazole (PRILOSEC) 20 MG capsule Take 20 mg by mouth daily.     . traMADol (ULTRAM) 50 MG tablet Take 50 mg by mouth 3 (three) times daily as needed for moderate pain.     Marland Kitchen gabapentin (NEURONTIN) 600 MG tablet Take 600 mg by mouth 3 (three) times daily.      No current facility-administered medications on file prior to visit.    There are no Patient Instructions on file for this visit. No follow-ups on file.  Marina Desire A Jodeci Rini, PA-C

## 2018-02-16 ENCOUNTER — Encounter: Payer: Self-pay | Admitting: Urology

## 2018-02-16 ENCOUNTER — Ambulatory Visit (INDEPENDENT_AMBULATORY_CARE_PROVIDER_SITE_OTHER): Payer: Medicare Other | Admitting: Urology

## 2018-02-16 VITALS — BP 109/67 | HR 69 | Ht 76.0 in | Wt 216.8 lb

## 2018-02-16 DIAGNOSIS — Z8551 Personal history of malignant neoplasm of bladder: Secondary | ICD-10-CM

## 2018-02-16 DIAGNOSIS — D494 Neoplasm of unspecified behavior of bladder: Secondary | ICD-10-CM | POA: Diagnosis not present

## 2018-02-16 LAB — URINALYSIS, COMPLETE
BILIRUBIN UA: NEGATIVE
Glucose, UA: NEGATIVE
Ketones, UA: NEGATIVE
Nitrite, UA: NEGATIVE
PH UA: 5.5 (ref 5.0–7.5)
RBC UA: NEGATIVE
Specific Gravity, UA: 1.02 (ref 1.005–1.030)
UUROB: 0.2 mg/dL (ref 0.2–1.0)

## 2018-02-16 LAB — MICROSCOPIC EXAMINATION: EPITHELIAL CELLS (NON RENAL): NONE SEEN /HPF (ref 0–10)

## 2018-02-16 MED ORDER — LIDOCAINE HCL URETHRAL/MUCOSAL 2 % EX GEL: 1.0000 "application " | Freq: Once | CUTANEOUS | Status: DC

## 2018-02-16 NOTE — Progress Notes (Signed)
   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  In 03/2017, he was noted to have small recurrences most consistent with low-grade noninvasive lesions.  He is now undergone 3 office procedures for an office fulguration for low-grade superficial appearing tumors including on 03/31/2017, 05/2016, and 11/2017.  Today for routine surveillance cystoscopy.  Most recent upper tract imaging in the form of CT abdomen and pelvis without contrast on 3/18 which was unremarkable. Bilateral retrograde pyelogram in the operating room of 08/2016 also negative.  He does smoking history, 3/4 ppd x 20 years. Quit in 1980.   Blood pressure 109/67, pulse 69, height 6\' 4"  (1.93 m), weight 216 lb 12.8 oz (98.3 kg). NED. A&Ox3.  No respiratory distress  Abd soft, NT, ND Normal phallus with bilateral descended testicles  Cystoscopy Procedure Note  Patient identification was confirmed, informed consent was obtained, and patient was prepped using Betadine solution.  Intravesical lidocaine solution was allowed to dwell for 30 minutes prior to the procedure. Please see see my note.  Preoperative abx where received prior to procedure.    Pre-Procedure: - Inspection reveals a normal caliber ureteral meatus.  Procedure: The flexible cystoscope was introduced without difficulty - No urethral strictures/lesions are present. - Enlargedprostate trilobar coaptation, mild bleeding with passage of scope secondary to friable prostatic mucosa - Mildly elevatedbladder neck - Bilateral ureteral orifices identified - Bladder mucosareveals multiple stellate scars on dome of bladder without any obvious tumors or recurrences - Mild trabeculation  Retroflexion shows intravesical protrusion the median lobe   Post-Procedure: - Patient tolerated the procedure well  Assessment/ Plan:  1. Malignant neoplasm of  lateral wall of urinary bladder (HCC) NED today Plan for cystoscopy again in 3 months If he continues to have small recurrences, will likely repeat upper tract imaging as well as consider biopsy of the lesions to ensure that this in fact is low-grade noninvasive disease as suspected  In the light of national back order BCG, we will continue to defer this for the time being to which the patient is aggreeable - Urinalysis, Complete - ciprofloxacin (CIPRO) tablet 500 mg; Take 1 tablet (500 mg total) by mouth once. - lidocaine (XYLOCAINE) 2 % jelly 1 application; Place 1 application into the urethra once.  Return in about 3 months (around 05/19/2018) for cysto.   Hollice Espy, MD

## 2018-05-12 DIAGNOSIS — Z950 Presence of cardiac pacemaker: Secondary | ICD-10-CM

## 2018-05-12 HISTORY — DX: Presence of cardiac pacemaker: Z95.0

## 2018-05-19 ENCOUNTER — Other Ambulatory Visit: Payer: Self-pay

## 2018-05-19 ENCOUNTER — Telehealth: Payer: Self-pay | Admitting: Radiology

## 2018-05-19 ENCOUNTER — Other Ambulatory Visit: Payer: Self-pay | Admitting: Radiology

## 2018-05-19 ENCOUNTER — Ambulatory Visit (INDEPENDENT_AMBULATORY_CARE_PROVIDER_SITE_OTHER): Payer: Medicare Other | Admitting: Urology

## 2018-05-19 ENCOUNTER — Encounter: Payer: Self-pay | Admitting: Urology

## 2018-05-19 VITALS — BP 123/70 | HR 84 | Ht 76.0 in | Wt 221.0 lb

## 2018-05-19 DIAGNOSIS — R310 Gross hematuria: Secondary | ICD-10-CM | POA: Diagnosis not present

## 2018-05-19 DIAGNOSIS — D4959 Neoplasm of unspecified behavior of other genitourinary organ: Secondary | ICD-10-CM

## 2018-05-19 DIAGNOSIS — Z8551 Personal history of malignant neoplasm of bladder: Secondary | ICD-10-CM

## 2018-05-19 LAB — URINALYSIS, COMPLETE
Bilirubin, UA: NEGATIVE
GLUCOSE, UA: NEGATIVE
Ketones, UA: NEGATIVE
LEUKOCYTES UA: NEGATIVE
NITRITE UA: NEGATIVE
RBC, UA: NEGATIVE
Specific Gravity, UA: 1.02 (ref 1.005–1.030)
Urobilinogen, Ur: 0.2 mg/dL (ref 0.2–1.0)
pH, UA: 5.5 (ref 5.0–7.5)

## 2018-05-19 LAB — MICROSCOPIC EXAMINATION
Epithelial Cells (non renal): NONE SEEN /hpf (ref 0–10)
RBC MICROSCOPIC, UA: NONE SEEN /HPF (ref 0–2)

## 2018-05-19 MED ORDER — SODIUM CHLORIDE 0.9 % IR SOLN: 2000.0000 mg | Freq: Once | Status: DC

## 2018-05-19 NOTE — Progress Notes (Signed)
   05/19/2018  CC: cysto  HPI: 85 y.o. 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  In 03/2017, he was noted to have small recurrences most consistent with low-grade noninvasive lesions.  He is now undergone 3 office procedures for an office fulguration for low-grade superficial appearing tumors including on 03/31/2017, 05/2016, and 11/2017.  Today for routine surveillance cystoscopy.  Most recent upper tract imaging in the form of CT abdomen and pelvis without contrast on 3/18 which was unremarkable. Bilateral retrograde pyelogram in the operating room of 08/2016 also negative.  He does smoking history, 3/4 ppd x 20 years. Quit in 1980.   Blood pressure 123/70, pulse 84, height 6\' 4"  (1.93 m), weight 221 lb (100.2 kg). NED. A&Ox3.  No respiratory distress  Abd soft, NT, ND Normal phallus with bilateral descended testicles  Cystoscopy Procedure Note  Patient identification was confirmed, informed consent was obtained, and patient was prepped using Betadine solution.  Intravesical lidocaine solution was allowed to dwell for 30 minutes prior to the procedure. Please see see my note.  Preoperative abx where received prior to procedure.   Pre-Procedure: - Inspection reveals a normal caliber ureteral meatus.  Procedure: The flexible cystoscope was introduced without difficulty - 1cm tumor in prostatic fossa near the prostatic apex on the left - Enlargedprostate trilobar coaptation, mild bleeding with passage of scope secondary to friable prostatic mucosa - Mildly elevatedbladder neck - Bilateral ureteral orifices identified - Bladder mucosareveals multiple stellate scars on dome of bladder without any obvious tumors or recurrences - Mild trabeculation  Retroflexion shows intravesical protrusion the median lobe  Post-Procedure: - Patient tolerated the procedure well  Assessment/ Plan:  1. 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  Schedule surgery   I, Adele Schilder, am acting as a scribe for Hollice Espy, MD.    I have reviewed the above documentation for accuracy and completeness, and I agree with the above.   Hollice Espy, MD

## 2018-05-19 NOTE — Telephone Encounter (Signed)
Patient was given the [facility] Information form below as well as the Instructions for Pre-Admission Testing form & a map of [facility].   [address], [address], [place] [address] Telephone: [phone] Fax: [phone]   Thank you for choosing [facility] for your upcoming surgery!  We are always here to assist in your urological needs.  Please read the following information with specific details for your upcoming appointments related to your surgery. Please contact Stephanne Greeley at [phone] Option 3 with any questions.  The Name of Your Surgery: Transurethral resection of the prostate Your Surgery Date: 06/14/2018 Your Surgeon: Hollice Espy  Please call Same Day Surgery at [phone] between the hours of 1pm-3pm one day prior to your surgery. They will inform you of the time to arrive at Same Day Surgery which is located on the second floor of the [facility].   Please refer to the attached letter regarding instructions for Pre-Admission Testing. You will receive a call from the [place] office regarding your appointment with them.  The Pre-Admission Testing office is located at [place], on the first floor of the [place] [facility] in [place] (office is to the right as you enter through the [employer] of the [facility]). Please have all medications you are currently taking and your insurance card available.   Patient was advised to have nothing to eat or drink after midnight the night prior to surgery except that he may have only water until 2 hours before surgery with nothing to drink within 2 hours of surgery.  The patient states he currently takes Aspirin 81mg  & was informed to hold medication for 7 days prior to surgery beginning on 06/07/2018. Patient's questions were answered and he expressed  understanding of these instructions.

## 2018-05-19 NOTE — H&P (View-Only) (Signed)
   05/19/2018  CC: cysto  HPI: 85 y.o. 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  In 03/2017, he was noted to have small recurrences most consistent with low-grade noninvasive lesions.  He is now undergone 3 office procedures for an office fulguration for low-grade superficial appearing tumors including on 03/31/2017, 05/2016, and 11/2017.  Today for routine surveillance cystoscopy.  Most recent upper tract imaging in the form of CT abdomen and pelvis without contrast on 3/18 which was unremarkable. Bilateral retrograde pyelogram in the operating room of 08/2016 also negative.  He does smoking history, 3/4 ppd x 20 years. Quit in 1980.   Blood pressure 123/70, pulse 84, height 6\' 4"  (1.93 m), weight 221 lb (100.2 kg). NED. A&Ox3.  No respiratory distress  Abd soft, NT, ND Normal phallus with bilateral descended testicles  Cystoscopy Procedure Note  Patient identification was confirmed, informed consent was obtained, and patient was prepped using Betadine solution.  Intravesical lidocaine solution was allowed to dwell for 30 minutes prior to the procedure. Please see see my note.  Preoperative abx where received prior to procedure.   Pre-Procedure: - Inspection reveals a normal caliber ureteral meatus.  Procedure: The flexible cystoscope was introduced without difficulty - 1cm tumor in prostatic fossa near the prostatic apex on the left - Enlargedprostate trilobar coaptation, mild bleeding with passage of scope secondary to friable prostatic mucosa - Mildly elevatedbladder neck - Bilateral ureteral orifices identified - Bladder mucosareveals multiple stellate scars on dome of bladder without any obvious tumors or recurrences - Mild trabeculation  Retroflexion shows intravesical protrusion the median lobe  Post-Procedure: - Patient tolerated the procedure well  Assessment/ Plan:  1. 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  Schedule surgery   I, Adele Schilder, am acting as a scribe for Hollice Espy, MD.    I have reviewed the above documentation for accuracy and completeness, and I agree with the above.   Hollice Espy, MD

## 2018-05-20 ENCOUNTER — Other Ambulatory Visit: Payer: Self-pay | Admitting: Radiology

## 2018-05-23 LAB — CULTURE, URINE COMPREHENSIVE

## 2018-05-27 ENCOUNTER — Other Ambulatory Visit: Payer: Self-pay | Admitting: Radiology

## 2018-05-31 ENCOUNTER — Other Ambulatory Visit: Payer: Self-pay

## 2018-05-31 ENCOUNTER — Encounter
Admission: RE | Admit: 2018-05-31 | Discharge: 2018-05-31 | Disposition: A | Discharge status: Home / Self Care | Payer: Medicare Other | Source: Ambulatory Visit | Attending: Urology | Admitting: Urology

## 2018-05-31 DIAGNOSIS — R001 Bradycardia, unspecified: Secondary | ICD-10-CM | POA: Insufficient documentation

## 2018-05-31 DIAGNOSIS — Z01818 Encounter for other preprocedural examination: Secondary | ICD-10-CM | POA: Diagnosis present

## 2018-05-31 DIAGNOSIS — I10 Essential (primary) hypertension: Secondary | ICD-10-CM | POA: Insufficient documentation

## 2018-05-31 LAB — CBC
HEMATOCRIT: 43.3 % (ref 39.0–52.0)
Hemoglobin: 14.1 g/dL (ref 13.0–17.0)
MCH: 28.5 pg (ref 26.0–34.0)
MCHC: 32.6 g/dL (ref 30.0–36.0)
MCV: 87.7 fL (ref 80.0–100.0)
NRBC: 0 % (ref 0.0–0.2)
Platelets: 284 10*3/uL (ref 150–400)
RBC: 4.94 MIL/uL (ref 4.22–5.81)
RDW: 13.8 % (ref 11.5–15.5)
WBC: 8.6 10*3/uL (ref 4.0–10.5)

## 2018-05-31 LAB — URINALYSIS, ROUTINE W REFLEX MICROSCOPIC
Bacteria, UA: NONE SEEN
Bilirubin Urine: NEGATIVE
Glucose, UA: NEGATIVE mg/dL
Hgb urine dipstick: NEGATIVE
KETONES UR: NEGATIVE mg/dL
Leukocytes, UA: NEGATIVE
Nitrite: NEGATIVE
Protein, ur: 100 mg/dL — AB
Specific Gravity, Urine: 1.019 (ref 1.005–1.030)
pH: 5 (ref 5.0–8.0)

## 2018-05-31 NOTE — Patient Instructions (Signed)
Your procedure is scheduled on: Monday 06/14/2018 Report to [facility]. To find out your arrival time please call [phone] between 1PM - 3PM on Friday 06/11/2018 .  Remember: Instructions that are not followed completely may result in serious medical risk, up to and including death, or upon the discretion of your surgeon and anesthesiologist your surgery may need to be rescheduled.     _X__ 1. Do not eat food after midnight the night before your procedure.                 No gum chewing or hard candies. You may drink clear liquids up to 2 hours                 before you are scheduled to arrive for your surgery- DO not drink clear                 liquids within 2 hours of the start of your surgery.                 Clear Liquids include:  water, apple juice without pulp, clear carbohydrate                 drink such as Clearfast or Gatorade, Black Coffee or Tea (Do not add                 anything to coffee or tea).  __X__2.  On the morning of surgery brush your teeth with toothpaste and water, you                 may rinse your mouth with mouthwash if you wish.  Do not swallow any              toothpaste of mouthwash.     _X__ 3.  No Alcohol for 24 hours before or after surgery.   _X__ 4.  Do Not Smoke or use e-cigarettes For 24 Hours Prior to Your Surgery.                 Do not use any chewable tobacco products for at least 6 hours prior to                 surgery.  ____  5.  Bring all medications with you on the day of surgery if instructed.   __X__  6.  Notify your doctor if there is any change in your medical condition      (cold, fever, infections).     Do not wear jewelry, make-up, hairpins, clips or nail polish. Do not wear lotions, powders, or perfumes.  Do not shave 48 hours prior to surgery. Men may shave face and neck. Do not bring valuables to the hospital.    [facility] is not responsible for any belongings or  valuables.  Contacts, dentures/partials or body piercings may not be worn into surgery. Bring a case for your contacts, glasses or hearing aids, a denture cup will be supplied. Leave your suitcase in the car. After surgery it may be brought to your room. For patients admitted to the hospital, discharge time is determined by your treatment team.   Patients discharged the day of surgery will not be allowed to drive home.   Please read over the following fact sheets that you were given:   MRSA Information  __X__ Take these medicines the morning of surgery with A SIP OF WATER:  1. allopurinol (ZYLOPRIM)  2. gabapentin (NEURONTIN)  3. omeprazole (PRILOSEC)  4.  5.  6.  ____ Fleet Enema (as directed)   ____ Use CHG Soap/SAGE wipes as directed  ____ Use inhalers on the day of surgery  ____ Stop metformin/Janumet/Farxiga 2 days prior to surgery    ____ Take 1/2 of usual insulin dose the night before surgery. No insulin the morning          of surgery.   __X__ Stop Blood Thinners Coumadin/Plavix/Xarelto/Pleta/Pradaxa/Eliquis/Effient/Aspirin 7 DAYS PRIOR TO PROCEDURE AS INSTRUCTED  Or contact your Surgeon, Cardiologist or Medical Doctor regarding  ability to stop your blood thinners  __X__ Stop Anti-inflammatories 7 days before surgery such as Advil, Ibuprofen, Motrin,  BC or Goodies Powder, Naprosyn, Naproxen, Aleve, Aspirin   TYLENOL OK  __X__ Stop all herbal supplements, fish oil or vitamin E until after surgery.    ____ Bring C-Pap to the hospital.

## 2018-06-02 LAB — URINE CULTURE: Culture: <10000 — AB

## 2018-06-13 MED ORDER — CEFAZOLIN SODIUM-DEXTROSE 2-4 GM/100ML-% IV SOLN
2.0000 g | INTRAVENOUS | Status: AC
  Administered 2018-06-14: 2 g via INTRAVENOUS

## 2018-06-14 ENCOUNTER — Other Ambulatory Visit: Payer: Self-pay

## 2018-06-14 ENCOUNTER — Encounter: Payer: Self-pay | Admitting: *Deleted

## 2018-06-14 ENCOUNTER — Ambulatory Visit: Payer: Medicare Other | Admitting: Certified Registered"

## 2018-06-14 ENCOUNTER — Encounter
Admission: RE | Disposition: A | Discharge status: Home / Self Care | Payer: Self-pay | Source: Home / Self Care | Attending: Urology

## 2018-06-14 ENCOUNTER — Ambulatory Visit
Admission: RE | Admit: 2018-06-14 | Discharge: 2018-06-14 | Disposition: A | Discharge status: Home / Self Care | Payer: Medicare Other | Attending: Urology | Admitting: Urology

## 2018-06-14 DIAGNOSIS — Z8551 Personal history of malignant neoplasm of bladder: Secondary | ICD-10-CM | POA: Diagnosis not present

## 2018-06-14 DIAGNOSIS — D4959 Neoplasm of unspecified behavior of other genitourinary organ: Secondary | ICD-10-CM

## 2018-06-14 DIAGNOSIS — D400 Neoplasm of uncertain behavior of prostate: Secondary | ICD-10-CM | POA: Diagnosis not present

## 2018-06-14 DIAGNOSIS — Z87891 Personal history of nicotine dependence: Secondary | ICD-10-CM | POA: Insufficient documentation

## 2018-06-14 DIAGNOSIS — C675 Malignant neoplasm of bladder neck: Secondary | ICD-10-CM | POA: Diagnosis not present

## 2018-06-14 DIAGNOSIS — D494 Neoplasm of unspecified behavior of bladder: Secondary | ICD-10-CM | POA: Diagnosis present

## 2018-06-14 DIAGNOSIS — C61 Malignant neoplasm of prostate: Secondary | ICD-10-CM | POA: Insufficient documentation

## 2018-06-14 HISTORY — PX: TRANSURETHRAL RESECTION OF PROSTATE: SHX73

## 2018-06-14 SURGERY — TURP (TRANSURETHRAL RESECTION OF PROSTATE)
Anesthesia: General | Site: Prostate

## 2018-06-14 MED ORDER — PROPOFOL 10 MG/ML IV BOLUS
INTRAVENOUS | Status: DC | PRN
  Administered 2018-06-14: 130 mg via INTRAVENOUS

## 2018-06-14 MED ORDER — PROPOFOL 10 MG/ML IV BOLUS
INTRAVENOUS | Status: AC
  Filled 2018-06-14: qty 20

## 2018-06-14 MED ORDER — FENTANYL CITRATE (PF) 100 MCG/2ML IJ SOLN
INTRAMUSCULAR | Status: DC | PRN
  Administered 2018-06-14: 25 ug via INTRAVENOUS

## 2018-06-14 MED ORDER — LIDOCAINE HCL (CARDIAC) PF 100 MG/5ML IV SOSY
PREFILLED_SYRINGE | INTRAVENOUS | Status: DC | PRN
  Administered 2018-06-14: 100 mg via INTRAVENOUS

## 2018-06-14 MED ORDER — EPHEDRINE SULFATE 50 MG/ML IJ SOLN
INTRAMUSCULAR | Status: AC
  Filled 2018-06-14: qty 1

## 2018-06-14 MED ORDER — LACTATED RINGERS IV SOLN
INTRAVENOUS | Status: DC
  Administered 2018-06-14: 09:00:00 via INTRAVENOUS

## 2018-06-14 MED ORDER — EPHEDRINE SULFATE 50 MG/ML IJ SOLN
INTRAMUSCULAR | Status: DC | PRN
  Administered 2018-06-14 (×2): 5 mg via INTRAVENOUS

## 2018-06-14 MED ORDER — CEFAZOLIN SODIUM-DEXTROSE 2-4 GM/100ML-% IV SOLN
INTRAVENOUS | Status: AC
  Filled 2018-06-14: qty 100

## 2018-06-14 MED ORDER — SODIUM CHLORIDE (PF) 0.9 % IJ SOLN
INTRAMUSCULAR | Status: AC
  Filled 2018-06-14: qty 10

## 2018-06-14 MED ORDER — PHENYLEPHRINE HCL 10 MG/ML IJ SOLN
INTRAMUSCULAR | Status: DC | PRN
  Administered 2018-06-14 (×2): 100 ug via INTRAVENOUS

## 2018-06-14 MED ORDER — HYDROCODONE-ACETAMINOPHEN 5-325 MG PO TABS: 1.0000 | ORAL_TABLET | Freq: Four times a day (QID) | ORAL | 0 refills | Status: DC | PRN

## 2018-06-14 MED ORDER — FENTANYL CITRATE (PF) 100 MCG/2ML IJ SOLN
INTRAMUSCULAR | Status: AC
  Filled 2018-06-14: qty 2

## 2018-06-14 SURGICAL SUPPLY — 24 items
ADAPTER IRRIG TUBE 2 SPIKE SOL (ADAPTER) ×4 IMPLANT
ADPR TBG 2 SPK PMP STRL ASCP (ADAPTER) ×2
BAG DRAIN CYSTO-URO LG1000N (MISCELLANEOUS) ×2 IMPLANT
BAG DRN LRG CPC RND TRDRP CNTR (MISCELLANEOUS) ×1
BAG URO DRAIN 4000ML (MISCELLANEOUS) ×2 IMPLANT
CATH FOL 2WAY LX 16X30 (CATHETERS) ×1 IMPLANT
CATH FOL 2WAY LX 24X30 (CATHETERS) IMPLANT
DRAPE UTILITY 15X26 TOWEL STRL (DRAPES) ×2 IMPLANT
ELECT LOOP 22F BIPOLAR SML (ELECTROSURGICAL)
ELECTRODE LOOP 22F BIPOLAR SML (ELECTROSURGICAL) IMPLANT
GLOVE BIO SURGEON STRL SZ 6.5 (GLOVE) ×3 IMPLANT
GOWN STRL REUS W/ TWL LRG LVL3 (GOWN DISPOSABLE) ×2 IMPLANT
GOWN STRL REUS W/TWL LRG LVL3 (GOWN DISPOSABLE) ×4
HOLDER FOLEY CATH W/STRAP (MISCELLANEOUS) ×2 IMPLANT
KIT TURNOVER CYSTO (KITS) ×2 IMPLANT
LOOP CUT BIPOLAR 24F LRG (ELECTROSURGICAL) IMPLANT
PACK CYSTO AR (MISCELLANEOUS) ×2 IMPLANT
SET IRRIG Y TYPE TUR BLADDER L (SET/KITS/TRAYS/PACK) ×2 IMPLANT
SOL .9 NS 3000ML IRR  AL (IV SOLUTION) ×6
SOL .9 NS 3000ML IRR AL (IV SOLUTION) ×6
SOL .9 NS 3000ML IRR UROMATIC (IV SOLUTION) ×6 IMPLANT
SURGILUBE 2OZ TUBE FLIPTOP (MISCELLANEOUS) ×2 IMPLANT
SYRINGE IRR TOOMEY STRL 70CC (SYRINGE) ×2 IMPLANT
WATER STERILE IRR 1000ML POUR (IV SOLUTION) ×2 IMPLANT

## 2018-06-14 NOTE — Anesthesia Preprocedure Evaluation (Signed)
Anesthesia Evaluation  Patient identified by MRN, date of birth, ID band Patient awake    Reviewed: Allergy & Precautions, H&P , NPO status , Patient's Chart, lab work & pertinent test results, reviewed documented beta blocker date and time   Airway Mallampati: II  TM Distance: >3 FB Neck ROM: full    Dental  (+) Teeth Intact   Pulmonary neg pulmonary ROS, former smoker,    Pulmonary exam normal        Cardiovascular Exercise Tolerance: Good hypertension, On Medications Normal cardiovascular exam+ dysrhythmias  Rate:Normal     Neuro/Psych  Neuromuscular disease negative psych ROS   GI/Hepatic Neg liver ROS, GERD  Medicated,  Endo/Other  negative endocrine ROS  Renal/GU Renal disease  negative genitourinary   Musculoskeletal   Abdominal   Peds  Hematology negative hematology ROS (+)   Anesthesia Other Findings   Reproductive/Obstetrics negative OB ROS                             Anesthesia Physical Anesthesia Plan  ASA: III  Anesthesia Plan: General LMA   Post-op Pain Management:    Induction:   PONV Risk Score and Plan:   Airway Management Planned:   Additional Equipment:   Intra-op Plan:   Post-operative Plan:   Informed Consent: I have reviewed the patients History and Physical, chart, labs and discussed the procedure including the risks, benefits and alternatives for the proposed anesthesia with the patient or authorized representative who has indicated his/her understanding and acceptance.       Plan Discussed with: CRNA  Anesthesia Plan Comments:         Anesthesia Quick Evaluation

## 2018-06-14 NOTE — Transfer of Care (Signed)
Immediate Anesthesia Transfer of Care Note  Patient: Michael Holloway  Procedure(s) Performed: TRANSURETHRAL RESECTION OF THE PROSTATE (TURP) with Gemcitabine (N/A Prostate)  Patient Location: PACU  Anesthesia Type:General  Level of Consciousness: awake  Airway & Oxygen Therapy: Patient Spontanous Breathing and Patient connected to face mask oxygen  Post-op Assessment: Report given to RN and Post -op Vital signs reviewed and unstable, Anesthesiologist notified  Post vital signs: Reviewed and stable  Last Vitals:  Vitals Value Taken Time  BP    Temp    Pulse    Resp    SpO2      Last Pain:  Vitals:   06/14/18 0804  TempSrc: Temporal  PainSc: 0-No pain         Complications: No apparent anesthesia complications

## 2018-06-14 NOTE — Op Note (Addendum)
Date of procedure: 06/14/18  Preoperative diagnosis:  1. History of recurrent bladder cancer 2. Bladder tumor (bladder neck and prostatic urethra)  Postoperative diagnosis:  1. same  Procedure: 1. Cystoscopy 2. TURP of prostatic tumor 3. TURBT of bladder neck tumor, less than 1 cm 4. Instillation of intravesical chemotherapy  Surgeon: Hollice Espy, MD  Anesthesia: General  Complications: None  Intraoperative findings: Papillary tumor within prostatic fossa, near left apex approximately 1 cm.  There is also some papillary change of the left bladder neck adjacent to the prostate possibly involving prostate tissue somewhat low-grade appearance less than 1 cm.  Multiple stellate scars throughout the bladder including the dome.  EBL: minimal  Specimens: Free-floating bladder tumor, bladder neck/prostate tumor  Drains: 16 French Foley  Indication: Michael Holloway is a 85 y.o. patient with recurrent bladder cancer found to have recurrence within his prostatic urethra.  After reviewing the management options for treatment, he elected to proceed with the above surgical procedure(s). We have discussed the potential benefits and risks of the procedure, side effects of the proposed treatment, the likelihood of the patient achieving the goals of the procedure, and any potential problems that might occur during the procedure or recuperation. Informed consent has been obtained.  Description of procedure:  The patient was taken to the operating room and general anesthesia was induced.  The patient was placed in the dorsal lithotomy position, prepped and draped in the usual sterile fashion, and preoperative antibiotics were administered. A preoperative time-out was performed.   A 6 French resectoscope was advanced using a blunt operator into the bladder without difficulty.  Upon inspection of the bladder, there was free-floating tumor noted within the bladder, presumably dislodged from the  prostatic mucosa at the time of scope placement.  This free-floating bladder tumor was sent as free-floating bladder tumor after being irrigated from the bladder.  Next, the bladder itself was inspected and noted to be free of any tumor other than some tumor just upon entering the bladder at the neck on the left side measuring less than 1 cm.  This had a low-grade and superficial-like appearance.  It did extend into the prostatic fossa with questionable prostate involvement.  In addition to this within the prostatic fossa, there was at least a 1 cm papillary frondular tumor near the left apex but not beyond the verumontanum.  There was also some frondular change at the opening into the bladder at the left anterior bladder neck.  The bipolar loop was then used using saline is a medium to resect the bladder neck tumor as well as prostatic tissue where tumor was identified.  These were irrigated free and passed off the field as bladder neck/prostate tumor.  Very careful and meticulous hemostasis was then achieved.  The remainder of the bladder was inspected and noted to be free of tumor.  I then placed a flexible cystoscope into the bladder and retroflexed ensure that I had indeed resected all of the tumor at the bladder neck.  No residual tumor was appreciated.  Finally the bladder was drained and the scope was removed.  A 16 French Foley catheter was placed and the balloon was filled with 10 cc of sterile water.  The patient is a clean and dry, repositioned supine position, reversed anesthesia, and taken the PACU in stable condition.  2000 mg of intravesical gemcitabine was instilled into the bladder and allowed to dwell for 1 hour in the PACU.  This was well-tolerated.  After an hour,  the bladder was drained but the Foley catheter was left in place and he was discharged home with this.  Plan: Patient will return in 2 days for voiding trial.  We previously discussed induction course of BCG which the patient is  agreeable to, risk and benefits were discussed.  As long as there is no invasive features, he would like to pursue this.  All questions answered.  Hollice Espy, M.D.

## 2018-06-14 NOTE — Anesthesia Procedure Notes (Signed)
Procedure Name: LMA Insertion Date/Time: 06/14/2018 9:00 AM Performed by: Chanetta Marshall, CRNA Pre-anesthesia Checklist: Patient identified, Emergency Drugs available, Suction available and Patient being monitored Patient Re-evaluated:Patient Re-evaluated prior to induction Oxygen Delivery Method: Circle system utilized Preoxygenation: Pre-oxygenation with 100% oxygen Induction Type: IV induction Ventilation: Mask ventilation without difficulty LMA: LMA inserted LMA Size: 4.0 Number of attempts: 1 Placement Confirmation: positive ETCO2,  CO2 detector and breath sounds checked- equal and bilateral Tube secured with: Tape Dental Injury: Teeth and Oropharynx as per pre-operative assessment

## 2018-06-14 NOTE — Interval H&P Note (Signed)
History and Physical Interval Note:  06/14/2018 8:39 AM  Michael Holloway  has presented today for surgery, with the diagnosis of Prostatic urethral tumor  The various methods of treatment have been discussed with the patient and family. After consideration of risks, benefits and other options for treatment, the patient has consented to  Procedure(s): TRANSURETHRAL RESECTION OF THE PROSTATE (TURP) with Gemcitabine (N/A) as a surgical intervention .  The patient's history has been reviewed, patient examined, no change in status, stable for surgery.  I have reviewed the patient's chart and labs.  Questions were answered to the patient's satisfaction.    RRR CTAB  Hollice Espy

## 2018-06-14 NOTE — Anesthesia Post-op Follow-up Note (Signed)
Anesthesia QCDR form completed.        

## 2018-06-14 NOTE — Discharge Instructions (Signed)
Indwelling Urinary Catheter Care, Adult An indwelling urinary catheter is a thin tube that is put into your bladder. The tube helps to drain pee (urine) out of your body. The tube goes in through your urethra. Your urethra is where pee comes out of your body. Your pee will come out through the catheter, then it will go into a bag (drainage bag). Take good care of your catheter so it will work well. How to wear your catheter and bag Supplies needed  Sticky tape (adhesive tape) or a leg strap.  Alcohol wipe or soap and water (if you use tape).  A clean towel (if you use tape).  Large overnight bag.  Smaller bag (leg bag). Wearing your catheter Attach your catheter to your leg with tape or a leg strap.  Make sure the catheter is not pulled tight.  If a leg strap gets wet, take it off and put on a dry strap.  If you use tape to hold the bag on your leg: 1. Use an alcohol wipe or soap and water to wash your skin where the tape made it sticky before. 2. Use a clean towel to pat-dry that skin. 3. Use new tape to make the bag stay on your leg. Wearing your bags You should have been given a large overnight bag.  You may wear the overnight bag in the day or night.  Always have the overnight bag lower than your bladder.  Do not let the bag touch the floor.  Before you go to sleep, put a clean plastic bag in a wastebasket. Then hang the overnight bag inside the wastebasket. You should also have a smaller leg bag that fits under your clothes.  Always wear the leg bag below your knee.  Do not wear your leg bag at night. How to care for your skin and catheter Supplies needed  A clean washcloth.  Water and mild soap.  A clean towel. Caring for your skin and catheter      Clean the skin around your catheter every day: ? Wash your hands with soap and water. ? Wet a clean washcloth in warm water and mild soap. ? Clean the skin around your urethra. ? If you are male: ? Gently  spread the folds of skin around your vagina (labia). ? With the washcloth in your other hand, wipe the inner side of your labia on each side. Wipe from front to back. ? If you are male: ? Pull back any skin that covers the end of your penis (foreskin). ? With the washcloth in your other hand, wipe your penis in small circles. Start wiping at the tip of your penis, then move away from the catheter. ? With your free hand, hold the catheter close to where it goes into your body. ? Keep holding the catheter during cleaning so it does not get pulled out. ? With the washcloth in your other hand, clean the catheter. ? Only wipe downward on the catheter. ? Do not wipe upward toward your body. Doing this may push germs into your urethra and cause infection. ? Use a clean towel to pat-dry the catheter and the skin around it. Make sure to wipe off all soap. ? Wash your hands with soap and water.  Shower every day. Do not take baths.  Do not use cream, ointment, or lotion on the area where the catheter goes into your body, unless your doctor tells you to.  Do not use powders, sprays, or lotions  on your genital area.  Check your skin around the catheter every day for signs of infection. Check for: ? Redness, swelling, or pain. ? Fluid or blood. ? Warmth. ? Pus or a bad smell. How to empty the bag Supplies needed  Rubbing alcohol.  Gauze pad or cotton ball.  Tape or a leg strap. Emptying the bag Pour the pee out of your bag when it is ?- full, or at least 2-3 times a day. Do this for your overnight bag and your leg bag. 1. Wash your hands with soap and water. 2. Separate (detach) the bag from your leg. 3. Hold the bag over the toilet or a clean pail. Keep the bag lower than your hips and bladder. This is so the pee (urine) does not go back into the tube. 4. Open the pour spout. It is at the bottom of the bag. 5. Empty the pee into the toilet or pail. Do not let the pour spout touch any  surface. 6. Put rubbing alcohol on a gauze pad or cotton ball. 7. Use the gauze pad or cotton ball to clean the pour spout. 8. Close the pour spout. 9. Attach the bag to your leg with tape or a leg strap. 10. Wash your hands with soap and water. Follow instructions for cleaning the drainage bag:  From the product maker.  As told by your doctor. How to change the bag Supplies needed  Alcohol wipes.  A clean bag.  Tape or a leg strap. Changing the bag Replace your bag with a clean bag once a month. If it starts to leak, smell bad, or look dirty, change it sooner. 1. Wash your hands with soap and water. 2. Separate the dirty bag from your leg. 3. Pinch the catheter with your fingers so that pee does not spill out. 4. Separate the catheter tube from the bag tube where these tubes connect (at the connection valve). Do not let the tubes touch any surface. 5. Clean the end of the catheter tube with an alcohol wipe. Use a different alcohol wipe to clean the end of the bag tube. 6. Connect the catheter tube to the tube of the clean bag. 7. Attach the clean bag to your leg with tape or a leg strap. Do not make the bag tight on your leg. 8. Wash your hands with soap and water. General rules   Never pull on your catheter. Never try to take it out. Doing that can hurt you.  Always wash your hands before and after you touch your catheter or bag. Use a mild, fragrance-free soap. If you do not have soap and water, use hand sanitizer.  Always make sure there are no twists or bends (kinks) in the catheter tube.  Always make sure there are no leaks in the catheter or bag.  Drink enough fluid to keep your pee pale yellow.  Do not take baths, swim, or use a hot tub.  If you are male, wipe from front to back after you poop (have a bowel movement). Contact a doctor if:  Your pee is cloudy.  Your pee smells worse than usual.  Your catheter gets clogged.  Your catheter leaks.  Your  bladder feels full. Get help right away if:  You have redness, swelling, or pain where the catheter goes into your body.  You have fluid, blood, pus, or a bad smell coming from the area where the catheter goes into your body.  Your skin feels warm  where the catheter goes into your body.  You have a fever.  You have pain in your: ? Belly (abdomen). ? Legs. ? Lower back. ? Bladder.  You see blood in the catheter.  Your pee is pink or red.  You feel sick to your stomach (nauseous).  You throw up (vomit).  You have chills.  Your pee is not draining into the bag.  Your catheter gets pulled out. Summary  An indwelling urinary catheter is a thin tube that is placed into the bladder to help drain pee (urine) out of the body.  The catheter is placed into the part of the body that drains pee from the bladder (urethra).  Taking good care of your catheter will keep it working properly and help prevent problems.  Always wash your hands before and after touching your catheter or bag.  Never pull on your catheter or try to take it out. This information is not intended to replace advice given to you by your health care provider. Make sure you discuss any questions you have with your health care provider. Document Released: 08/23/2012 Document Revised: 10/19/2017 Document Reviewed: 12/12/2016 Elsevier Interactive Patient Education  2019 [place]   1) The drugs that you were given will stay in your system until tomorrow so for the next 24 hours you should not:  A) Drive an automobile B) Make any legal decisions C) Drink any alcoholic beverage   2) You may resume regular meals tomorrow.  Today it is better to start with liquids and gradually work up to solid foods.  You may eat anything you prefer, but it is better to start with liquids, then soup and crackers, and gradually work up to solid foods.   3) Please notify your  doctor immediately if you have any unusual bleeding, trouble breathing, redness and pain at the surgery site, drainage, fever, or pain not relieved by medication.    4) Additional Instructions: Force fluid.  Avoid caffeine and spicy foods  Please contact your physician with any problems or Same Day Surgery at [phone], Monday through Friday 6 am to 4 pm, or [place] at [facility] number at [phone].

## 2018-06-15 ENCOUNTER — Encounter: Payer: Self-pay | Admitting: Urology

## 2018-06-15 LAB — SURGICAL PATHOLOGY

## 2018-06-16 ENCOUNTER — Ambulatory Visit (INDEPENDENT_AMBULATORY_CARE_PROVIDER_SITE_OTHER): Payer: Medicare Other | Admitting: Family Medicine

## 2018-06-16 DIAGNOSIS — D4959 Neoplasm of unspecified behavior of other genitourinary organ: Secondary | ICD-10-CM

## 2018-06-16 NOTE — Progress Notes (Signed)
Catheter Removal  Patient is present today for a catheter removal.  58ml of water was drained from the balloon. A 14FR foley cath was removed from the bladder no complications were noted . Patient tolerated well.  Preformed by: Elberta Leatherwood, CMA  Follow up/ Additional notes: As scheulded

## 2018-06-18 NOTE — Anesthesia Postprocedure Evaluation (Signed)
Anesthesia Post Note  Patient: Michael Holloway  Procedure(s) Performed: TRANSURETHRAL RESECTION OF THE PROSTATE (TURP) with Gemcitabine (N/A Prostate)  Patient location during evaluation: PACU Anesthesia Type: General Level of consciousness: awake and alert Pain management: pain level controlled Vital Signs Assessment: post-procedure vital signs reviewed and stable Respiratory status: spontaneous breathing, nonlabored ventilation, respiratory function stable and patient connected to nasal cannula oxygen Cardiovascular status: blood pressure returned to baseline and stable Postop Assessment: no apparent nausea or vomiting Anesthetic complications: no     Last Vitals:  Vitals:   06/14/18 1116 06/14/18 1221  BP: (!) 148/63 (!) 124/59  Pulse: 63 70  Resp: 16 16  Temp: (!) 36.4 C   SpO2: 100% 100%    Last Pain:  Vitals:   06/14/18 1221  TempSrc:   PainSc: 0-No pain                 Molli Barrows

## 2018-06-23 ENCOUNTER — Telehealth: Payer: Self-pay

## 2018-06-23 NOTE — Telephone Encounter (Signed)
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  Recommend continuation with BCG as previously discussed.  Hollice Espy, MD

## 2018-06-23 NOTE — Telephone Encounter (Signed)
Left message for patient to return call.

## 2018-06-23 NOTE — Telephone Encounter (Signed)
Spoke to patient and was notified of results. He is set up for BCG treatments.

## 2018-07-07 DIAGNOSIS — I455 Other specified heart block: Secondary | ICD-10-CM | POA: Diagnosis present

## 2018-07-07 DIAGNOSIS — I495 Sick sinus syndrome: Secondary | ICD-10-CM | POA: Insufficient documentation

## 2018-07-20 ENCOUNTER — Ambulatory Visit
Admission: RE | Admit: 2018-07-20 | Discharge: 2018-07-20 | Disposition: A | Discharge status: Home / Self Care | Payer: Medicare Other | Source: Ambulatory Visit | Attending: Cardiology | Admitting: Cardiology

## 2018-07-20 ENCOUNTER — Other Ambulatory Visit: Payer: Self-pay

## 2018-07-20 ENCOUNTER — Encounter
Admission: RE | Admit: 2018-07-20 | Discharge: 2018-07-20 | Disposition: A | Discharge status: Home / Self Care | Payer: Medicare Other | Source: Ambulatory Visit | Attending: Cardiology | Admitting: Cardiology

## 2018-07-20 DIAGNOSIS — Z950 Presence of cardiac pacemaker: Secondary | ICD-10-CM | POA: Insufficient documentation

## 2018-07-20 DIAGNOSIS — Z01818 Encounter for other preprocedural examination: Secondary | ICD-10-CM | POA: Insufficient documentation

## 2018-07-20 DIAGNOSIS — Z419 Encounter for procedure for purposes other than remedying health state, unspecified: Secondary | ICD-10-CM

## 2018-07-20 HISTORY — DX: Cardiac arrhythmia, unspecified: I49.9

## 2018-07-20 LAB — SURGICAL PCR SCREEN
MRSA, PCR: NEGATIVE
Staphylococcus aureus: POSITIVE — AB

## 2018-07-20 LAB — APTT: aPTT: 32 seconds (ref 24–36)

## 2018-07-20 LAB — BASIC METABOLIC PANEL
Anion gap: 9 (ref 5–15)
BUN: 32 mg/dL — ABNORMAL HIGH (ref 8–23)
CO2: 27 mmol/L (ref 22–32)
Calcium: 9.2 mg/dL (ref 8.9–10.3)
Chloride: 104 mmol/L (ref 98–111)
Creatinine, Ser: 1.77 mg/dL — ABNORMAL HIGH (ref 0.61–1.24)
GFR calc Af Amer: 39 mL/min — ABNORMAL LOW (ref >60)
GFR calc non Af Amer: 34 mL/min — ABNORMAL LOW (ref >60)
GLUCOSE: 124 mg/dL — AB (ref 70–99)
Potassium: 4.5 mmol/L (ref 3.5–5.1)
Sodium: 140 mmol/L (ref 135–145)

## 2018-07-20 LAB — CBC
HEMATOCRIT: 40.9 % (ref 39.0–52.0)
Hemoglobin: 13.2 g/dL (ref 13.0–17.0)
MCH: 28.4 pg (ref 26.0–34.0)
MCHC: 32.3 g/dL (ref 30.0–36.0)
MCV: 88 fL (ref 80.0–100.0)
Platelets: 273 10*3/uL (ref 150–400)
RBC: 4.65 MIL/uL (ref 4.22–5.81)
RDW: 14.2 % (ref 11.5–15.5)
WBC: 7.2 10*3/uL (ref 4.0–10.5)
nRBC: 0 % (ref 0.0–0.2)

## 2018-07-20 LAB — PROTIME-INR
INR: 1 (ref 0.8–1.2)
Prothrombin Time: 12.6 seconds (ref 11.4–15.2)

## 2018-07-20 MED ORDER — ONDANSETRON HCL 4 MG/2ML IJ SOLN
4.0000 mg | Freq: Once | INTRAMUSCULAR | Status: DC | PRN
  Filled 2018-07-20: qty 2

## 2018-07-20 MED ORDER — FENTANYL CITRATE (PF) 100 MCG/2ML IJ SOLN
25.0000 ug | INTRAMUSCULAR | Status: DC | PRN
  Filled 2018-07-20: qty 0.5

## 2018-07-20 NOTE — Patient Instructions (Signed)
Your procedure is scheduled on: Tues. 08/03/18 Report to Day Surgery. To find out your arrival time please call [phone] between 1PM - 3PM on Mon 3/23.  Remember: Instructions that are not followed completely may result in serious medical risk,  up to and including death, or upon the discretion of your surgeon and anesthesiologist your  surgery may need to be rescheduled.     _X__ 1. Do not eat food after midnight the night before your procedure.                 No gum chewing or hard candies. You may drink clear liquids up to 2 hours                 before you are scheduled to arrive for your surgery- DO not drink clear                 liquids within 2 hours of the start of your surgery.                 Clear Liquids include:  water, apple juice without pulp, clear carbohydrate                 drink such as Clearfast of Gatorade, Black Coffee or Tea (Do not add                 anything to coffee or tea).  __X__2.  On the morning of surgery brush your teeth with toothpaste and water, you                may rinse your mouth with mouthwash if you wish.  Do not swallow any toothpaste of mouthwash.     _X__ 3.  No Alcohol for 24 hours before or after surgery.   _X__ 4.  Do Not Smoke or use e-cigarettes For 24 Hours Prior to Your Surgery.                 Do not use any chewable tobacco products for at least 6 hours prior to                 surgery.  ____  5.  Bring all medications with you on the day of surgery if instructed.   __x__  6.  Notify your doctor if there is any change in your medical condition      (cold, fever, infections).     Do not wear jewelry, make-up, hairpins, clips or nail polish. Do not wear lotions, powders, or perfumes. You may wear deodorant. Do not shave 48 hours prior to surgery. Men may shave face and neck. Do not bring valuables to the hospital.    [facility] is not responsible for any belongings or valuables.  Contacts,  dentures or bridgework may not be worn into surgery. Leave your suitcase in the car. After surgery it may be brought to your room. For patients admitted to the hospital, discharge time is determined by your treatment team.   Patients discharged the day of surgery will not be allowed to drive home.   Please read over the following fact sheets that you were given:    __x__ Take these medicines the morning of surgery with A SIP OF WATER:    1. allopurinol (ZYLOPRIM) 100 MG tablet  2. gabapentin (NEURONTIN) 600 MG tablet  3. omeprazole (PRILOSEC) 20 MG capsule  Take one night before and morning of the surgery  4.traMADol (ULTRAM) 50 MG tablet  if needed  5.  6.  ____ Fleet Enema (as directed)   __x__ Use CHG Soap as directed  ____ Use inhalers on the day of surgery  ____ Stop metformin 2 days prior to surgery    ____ Take 1/2 of usual insulin dose the night before surgery. No insulin the morning          of surgery.   _x__ Stop aspirin on 07/27/18  ____ Stop Anti-inflammatories on    __x__ Stop supplements until after surgery.  Biotin 10 MG CAPS 07/27/18  ____ Bring C-Pap to the hospital.

## 2018-07-21 NOTE — Pre-Procedure Instructions (Signed)
FAXED LABS TO DR PARASCHOS

## 2018-07-22 NOTE — Pre-Procedure Instructions (Signed)
RECEIVED INFO PATIENT WILL TAKE VANCOMYCIN 1 GM

## 2018-07-28 ENCOUNTER — Ambulatory Visit: Payer: Self-pay | Admitting: Urology

## 2018-07-29 ENCOUNTER — Telehealth: Payer: Self-pay

## 2018-07-29 NOTE — Telephone Encounter (Signed)
Left pt message to call to discuss cancellation of BCG treatments and will place on recall list

## 2018-07-30 NOTE — Telephone Encounter (Signed)
Patient notified and verbalized understanding. 

## 2018-08-02 MED ORDER — CEFAZOLIN SODIUM-DEXTROSE 1-4 GM/50ML-% IV SOLN: 1.0000 g | Freq: Once | INTRAVENOUS | Status: DC

## 2018-08-02 MED ORDER — VANCOMYCIN HCL IN DEXTROSE 1-5 GM/200ML-% IV SOLN
1000.0000 mg | Freq: Once | INTRAVENOUS | Status: AC
  Administered 2018-08-03: 1000 mg via INTRAVENOUS

## 2018-08-03 ENCOUNTER — Ambulatory Visit: Payer: Medicare Other | Admitting: Anesthesiology

## 2018-08-03 ENCOUNTER — Encounter: Payer: Self-pay | Admitting: *Deleted

## 2018-08-03 ENCOUNTER — Encounter
Admission: RE | Disposition: A | Discharge status: Home / Self Care | Payer: Self-pay | Source: Home / Self Care | Attending: Cardiology

## 2018-08-03 ENCOUNTER — Observation Stay: Payer: Medicare Other

## 2018-08-03 ENCOUNTER — Ambulatory Visit: Payer: Medicare Other

## 2018-08-03 ENCOUNTER — Other Ambulatory Visit: Payer: Self-pay

## 2018-08-03 ENCOUNTER — Observation Stay
Admission: RE | Admit: 2018-08-03 | Discharge: 2018-08-04 | Disposition: A | Discharge status: Home / Self Care | Payer: Medicare Other | Attending: Cardiology | Admitting: Cardiology

## 2018-08-03 DIAGNOSIS — N183 Chronic kidney disease, stage 3 (moderate): Secondary | ICD-10-CM | POA: Insufficient documentation

## 2018-08-03 DIAGNOSIS — I129 Hypertensive chronic kidney disease with stage 1 through stage 4 chronic kidney disease, or unspecified chronic kidney disease: Secondary | ICD-10-CM | POA: Diagnosis not present

## 2018-08-03 DIAGNOSIS — Z7982 Long term (current) use of aspirin: Secondary | ICD-10-CM | POA: Insufficient documentation

## 2018-08-03 DIAGNOSIS — E785 Hyperlipidemia, unspecified: Secondary | ICD-10-CM | POA: Diagnosis not present

## 2018-08-03 DIAGNOSIS — Z79899 Other long term (current) drug therapy: Secondary | ICD-10-CM | POA: Insufficient documentation

## 2018-08-03 DIAGNOSIS — M109 Gout, unspecified: Secondary | ICD-10-CM | POA: Diagnosis not present

## 2018-08-03 DIAGNOSIS — Z87891 Personal history of nicotine dependence: Secondary | ICD-10-CM | POA: Diagnosis not present

## 2018-08-03 DIAGNOSIS — Z79891 Long term (current) use of opiate analgesic: Secondary | ICD-10-CM | POA: Insufficient documentation

## 2018-08-03 DIAGNOSIS — K219 Gastro-esophageal reflux disease without esophagitis: Secondary | ICD-10-CM | POA: Insufficient documentation

## 2018-08-03 DIAGNOSIS — G629 Polyneuropathy, unspecified: Secondary | ICD-10-CM | POA: Insufficient documentation

## 2018-08-03 DIAGNOSIS — Z950 Presence of cardiac pacemaker: Secondary | ICD-10-CM

## 2018-08-03 DIAGNOSIS — Z85828 Personal history of other malignant neoplasm of skin: Secondary | ICD-10-CM | POA: Insufficient documentation

## 2018-08-03 DIAGNOSIS — I495 Sick sinus syndrome: Principal | ICD-10-CM | POA: Insufficient documentation

## 2018-08-03 DIAGNOSIS — I455 Other specified heart block: Secondary | ICD-10-CM | POA: Diagnosis present

## 2018-08-03 HISTORY — PX: PACEMAKER INSERTION: SHX728

## 2018-08-03 SURGERY — INSERTION, CARDIAC PACEMAKER
Anesthesia: General | Laterality: Left

## 2018-08-03 MED ORDER — GABAPENTIN 600 MG PO TABS
600.0000 mg | ORAL_TABLET | Freq: Three times a day (TID) | ORAL | Status: DC
  Administered 2018-08-03 – 2018-08-04 (×3): 600 mg via ORAL
  Filled 2018-08-03 (×3): qty 1

## 2018-08-03 MED ORDER — FENTANYL CITRATE (PF) 100 MCG/2ML IJ SOLN
INTRAMUSCULAR | Status: AC
  Filled 2018-08-03: qty 2

## 2018-08-03 MED ORDER — LACTATED RINGERS IV SOLN
INTRAVENOUS | Status: DC
  Administered 2018-08-03: 13:00:00 via INTRAVENOUS

## 2018-08-03 MED ORDER — EPHEDRINE SULFATE 50 MG/ML IJ SOLN
INTRAMUSCULAR | Status: DC | PRN
  Administered 2018-08-03 (×2): 10 mg via INTRAVENOUS

## 2018-08-03 MED ORDER — LIDOCAINE HCL URETHRAL/MUCOSAL 2 % EX GEL
CUTANEOUS | Status: DC | PRN
  Administered 2018-08-03: 1 via TOPICAL

## 2018-08-03 MED ORDER — CHLORHEXIDINE GLUCONATE CLOTH 2 % EX PADS: 6.0000 | MEDICATED_PAD | Freq: Every day | CUTANEOUS | Status: DC

## 2018-08-03 MED ORDER — HYDROCODONE-ACETAMINOPHEN 5-325 MG PO TABS: 1.0000 | ORAL_TABLET | Freq: Four times a day (QID) | ORAL | Status: DC | PRN

## 2018-08-03 MED ORDER — CEFAZOLIN SODIUM-DEXTROSE 1-4 GM/50ML-% IV SOLN
INTRAVENOUS | Status: AC
  Filled 2018-08-03: qty 50

## 2018-08-03 MED ORDER — TRAMADOL HCL 50 MG PO TABS
50.0000 mg | ORAL_TABLET | Freq: Four times a day (QID) | ORAL | Status: DC | PRN
  Administered 2018-08-03: 50 mg via ORAL
  Filled 2018-08-03 (×2): qty 1

## 2018-08-03 MED ORDER — AMLODIPINE BESYLATE 5 MG PO TABS
5.0000 mg | ORAL_TABLET | Freq: Every day | ORAL | Status: DC
  Filled 2018-08-03: qty 1

## 2018-08-03 MED ORDER — SODIUM CHLORIDE 0.9 % IV SOLN
Freq: Once | INTRAVENOUS | Status: DC
  Filled 2018-08-03: qty 2

## 2018-08-03 MED ORDER — PROPOFOL 500 MG/50ML IV EMUL
INTRAVENOUS | Status: AC
  Filled 2018-08-03: qty 50

## 2018-08-03 MED ORDER — PROPOFOL 500 MG/50ML IV EMUL
INTRAVENOUS | Status: DC | PRN
  Administered 2018-08-03: 100 ug/kg/min via INTRAVENOUS

## 2018-08-03 MED ORDER — PHENYLEPHRINE HCL 10 MG/ML IJ SOLN
INTRAMUSCULAR | Status: DC | PRN
  Administered 2018-08-03 (×2): 100 ug via INTRAVENOUS

## 2018-08-03 MED ORDER — ACETAMINOPHEN 325 MG PO TABS: 325.0000 mg | ORAL_TABLET | ORAL | Status: DC | PRN

## 2018-08-03 MED ORDER — ONDANSETRON HCL 4 MG/2ML IJ SOLN: 4.0000 mg | Freq: Once | INTRAMUSCULAR | Status: DC | PRN

## 2018-08-03 MED ORDER — VANCOMYCIN HCL IN DEXTROSE 1-5 GM/200ML-% IV SOLN
1000.0000 mg | Freq: Two times a day (BID) | INTRAVENOUS | Status: AC
  Administered 2018-08-04: 1000 mg via INTRAVENOUS
  Filled 2018-08-03: qty 200

## 2018-08-03 MED ORDER — LIDOCAINE 1 % OPTIME INJ - NO CHARGE
INTRAMUSCULAR | Status: DC | PRN
  Administered 2018-08-03: 30 mL

## 2018-08-03 MED ORDER — VANCOMYCIN HCL IN DEXTROSE 1-5 GM/200ML-% IV SOLN
INTRAVENOUS | Status: AC
  Administered 2018-08-03: 1000 mg via INTRAVENOUS
  Filled 2018-08-03: qty 200

## 2018-08-03 MED ORDER — MUPIROCIN 2 % EX OINT
1.0000 "application " | TOPICAL_OINTMENT | Freq: Two times a day (BID) | CUTANEOUS | Status: DC
  Filled 2018-08-03: qty 22

## 2018-08-03 MED ORDER — ONDANSETRON HCL 4 MG/2ML IJ SOLN: 4.0000 mg | Freq: Four times a day (QID) | INTRAMUSCULAR | Status: DC | PRN

## 2018-08-03 MED ORDER — LOSARTAN POTASSIUM 50 MG PO TABS
50.0000 mg | ORAL_TABLET | Freq: Every day | ORAL | Status: DC
  Administered 2018-08-03: 50 mg via ORAL
  Filled 2018-08-03 (×2): qty 1

## 2018-08-03 MED ORDER — ALLOPURINOL 100 MG PO TABS
100.0000 mg | ORAL_TABLET | Freq: Every day | ORAL | Status: DC
  Administered 2018-08-04: 100 mg via ORAL
  Filled 2018-08-03 (×3): qty 1

## 2018-08-03 MED ORDER — AMLODIPINE BESYLATE 10 MG PO TABS
10.0000 mg | ORAL_TABLET | Freq: Every day | ORAL | Status: DC
  Filled 2018-08-03: qty 1

## 2018-08-03 MED ORDER — FENTANYL CITRATE (PF) 100 MCG/2ML IJ SOLN: 25.0000 ug | INTRAMUSCULAR | Status: DC | PRN

## 2018-08-03 MED ORDER — FENTANYL CITRATE (PF) 100 MCG/2ML IJ SOLN
INTRAMUSCULAR | Status: DC | PRN
  Administered 2018-08-03: 25 ug via INTRAVENOUS

## 2018-08-03 MED ORDER — PROPOFOL 10 MG/ML IV BOLUS
INTRAVENOUS | Status: DC | PRN
  Administered 2018-08-03: 10 mg via INTRAVENOUS
  Administered 2018-08-03: 20 mg via INTRAVENOUS
  Administered 2018-08-03 (×2): 10 mg via INTRAVENOUS
  Administered 2018-08-03: 20 mg via INTRAVENOUS

## 2018-08-03 MED ORDER — LIDOCAINE HCL (PF) 2 % IJ SOLN
INTRAMUSCULAR | Status: DC | PRN
  Administered 2018-08-03: 40 mg via INTRADERMAL

## 2018-08-03 MED ORDER — LIDOCAINE HCL (PF) 2 % IJ SOLN
INTRAMUSCULAR | Status: AC
  Filled 2018-08-03: qty 10

## 2018-08-03 MED ORDER — GENTAMICIN SULFATE 40 MG/ML IJ SOLN
INTRAMUSCULAR | Status: AC
  Filled 2018-08-03: qty 2

## 2018-08-03 SURGICAL SUPPLY — 39 items
APL PRP STRL LF DISP 70% ISPRP (MISCELLANEOUS) ×1
BAG DECANTER FOR FLEXI CONT (MISCELLANEOUS) ×2 IMPLANT
BRUSH SCRUB EZ  4% CHG (MISCELLANEOUS) ×1
BRUSH SCRUB EZ 4% CHG (MISCELLANEOUS) ×1 IMPLANT
CABLE SURG 12 DISP A/V CHANNEL (MISCELLANEOUS) ×2 IMPLANT
CANISTER SUCT 1200ML W/VALVE (MISCELLANEOUS) ×2 IMPLANT
CHLORAPREP W/TINT 26 (MISCELLANEOUS) ×2 IMPLANT
COVER LIGHT HANDLE STERIS (MISCELLANEOUS) ×4 IMPLANT
COVER MAYO STAND STRL (DRAPES) ×2 IMPLANT
COVER WAND RF STERILE (DRAPES) ×2 IMPLANT
DRAPE C-ARM XRAY 36X54 (DRAPES) ×2 IMPLANT
DRSG TEGADERM 4X4.75 (GAUZE/BANDAGES/DRESSINGS) ×2 IMPLANT
DRSG TELFA 4X3 1S NADH ST (GAUZE/BANDAGES/DRESSINGS) ×2 IMPLANT
ELECT REM PT RETURN 9FT ADLT (ELECTROSURGICAL) ×2
ELECTRODE REM PT RTRN 9FT ADLT (ELECTROSURGICAL) ×1 IMPLANT
GLOVE BIO SURGEON STRL SZ7.5 (GLOVE) ×2 IMPLANT
GLOVE BIO SURGEON STRL SZ8 (GLOVE) ×2 IMPLANT
GOWN STRL REUS W/ TWL LRG LVL3 (GOWN DISPOSABLE) ×1 IMPLANT
GOWN STRL REUS W/ TWL XL LVL3 (GOWN DISPOSABLE) ×1 IMPLANT
GOWN STRL REUS W/TWL LRG LVL3 (GOWN DISPOSABLE) ×2
GOWN STRL REUS W/TWL XL LVL3 (GOWN DISPOSABLE) ×2
IMMOBILIZER SHDR MD LX WHT (SOFTGOODS) IMPLANT
IMMOBILIZER SHDR XL LX WHT (SOFTGOODS) ×1 IMPLANT
INTRO PACEMAKR LEAD 9FR 13CM (INTRODUCER) ×2
INTRO PACEMKR SHEATH II 7FR (MISCELLANEOUS) ×2
INTRODUCER PACEMKR LD 9FR 13CM (INTRODUCER) IMPLANT
INTRODUCER PACEMKR SHTH II 7FR (MISCELLANEOUS) ×1 IMPLANT
IPG PACE AZUR XT DR MRI W1DR01 (Pacemaker) IMPLANT
IV NS 500ML (IV SOLUTION) ×2
IV NS 500ML BAXH (IV SOLUTION) ×1 IMPLANT
KIT TURNOVER KIT A (KITS) ×2 IMPLANT
LABEL OR SOLS (LABEL) ×2 IMPLANT
LEAD CAPSURE NOVUS 5076-52CM (Lead) ×1 IMPLANT
LEAD CAPSURE NOVUS 5076-58CM (Lead) ×1 IMPLANT
MARKER SKIN DUAL TIP RULER LAB (MISCELLANEOUS) ×2 IMPLANT
PACE AZURE XT DR MRI W1DR01 (Pacemaker) ×2 IMPLANT
PACK PACE INSERTION (MISCELLANEOUS) ×2 IMPLANT
PAD ONESTEP ZOLL R SERIES ADT (MISCELLANEOUS) ×2 IMPLANT
SUT SILK 0 SH 30 (SUTURE) ×6 IMPLANT

## 2018-08-03 NOTE — Progress Notes (Addendum)
Patient asking for a benadryl for nasal drip and to help him sleep.  Since the patient is a direct admit from cardiology, the hospitalist stated that he is not allowed to order medications for him and to page the cardiologist.  [facility] cardiologist on call, Dr. Nehemiah Massed, paged.

## 2018-08-03 NOTE — Transfer of Care (Signed)
Immediate Anesthesia Transfer of Care Note  Patient: Rowland Lathe  Procedure(s) Performed: Procedure(s): INSERTION PACEMAKER DUALCHAMBER (Left)  Patient Location: PACU  Anesthesia Type:General  Level of Consciousness: sedated  Airway & Oxygen Therapy: Patient Spontanous Breathing and Patient connected to face mask oxygen  Post-op Assessment: Report given to RN and Post -op Vital signs reviewed and stable  Post vital signs: Reviewed and stable  Last Vitals:  Vitals:   08/03/18 1226 08/03/18 1427  BP: 99/78 126/72  Pulse: 78 72  Resp: 16 11  Temp: 36.8 C 36.5 C  SpO2: 276% 18%    Complications: No apparent anesthesia complications

## 2018-08-03 NOTE — Interval H&P Note (Signed)
History and Physical Interval Note:  08/03/2018 12:34 PM  Michael Holloway  has presented today for surgery, with the diagnosis of SSS/PAUSES/SYNCOPE.  The various methods of treatment have been discussed with the patient and family. After consideration of risks, benefits and other options for treatment, the patient has consented to  Procedure(s): [name] (Left) as a surgical intervention.  The patient's history has been reviewed, patient examined, no change in status, stable for surgery.  I have reviewed the patient's chart and labs.  Questions were answered to the patient's satisfaction.     [name] [employer]

## 2018-08-03 NOTE — H&P (Signed)
Jump to Section ? Document InformationECG ResultsEncounter DetailsHistorical MedicationsLast Filed Vital SignsPatient ContactsPatient DemographicsPatient InstructionsPlan of TreatmentProceduresProgress NotesReason for ReferralReason for VisitSocial HistoryVisit Diagnoses [place] Lathe Encounter Summary, generated on Mar. 24, 2020March 24, 2020 Printout Information  Document Contents Document Received Date Document Source Organization  Office Visit Mar. 24, 2020March 24, 2020 [place]   Patient Demographics - 85 y.o. Male; born [birth year]  Patient Address Communication Language Race / Ethnicity Marital Status  [address] [place], [place] [address] [phone] [facility]) [phone] (Home) [email or web address] English (Preferred) White / Not Hispanic or Latino Widowed  Reason for Referral  Procedure (Routine) Procedure (Routine)  Status Reason Specialty Diagnoses / Procedures Referred By Contact Referred To Contact  Authorized   Diagnoses  Syncope, unspecified syncope type    Procedures  ECG 12-lead  Isaias Cowman, MD  [address]  [facility]  [place], [place] [address]  Phone: [phone]  Fax: [phone]        Reason for Visit  Reason Comments  Follow-up to talk about a pacemaker  Loss of Consciousness having more spells   Encounter Details  Date Type Department Care Team Description  07/07/2018 Office Visit [facility]  [place], [place] [address]  [phone]  Isaias Cowman, MD  [place]  [facility]  [place], [place] [address]  [phone]  [phone] (Fax)  Syncope, unspecified syncope type (Primary Dx);  Benign essential hypertension;  Essential hypertension;  Near syncope;  CKD (chronic kidney disease) stage 3, GFR 30-59 ml/min (CMS-HCC);  Hyperlipidemia, unspecified  hyperlipidemia type;  Sinus pause;  Sick sinus syndrome (CMS-HCC)   Social History - documented as of this encounter Tobacco Use Types Packs/Day Years Used Date  Former Smoker  0.5 30 05/12/1946 - 05/12/1978  Smokeless Tobacco: Never Used      Comments: 1/2 pack daily   Alcohol Use Drinks/Week oz/Week Comments  Yes 7-10 Glasses of wine  7.0 - 10.0 Beer 1 Bottle @ Dinner   Alcohol Habits Answer Date Recorded  How often do you have a drink containing alcohol? 4 or more times a week 12/22/2017  How many drinks containing alcohol do you have on a typical day when you are drinking? 1 or 2 12/22/2017  How often do you have six or more drinks on one occasion? Never 12/22/2017   Sex Assigned at Birth Date Recorded  Male 04/17/2017 10:30 AM EST   Job Start Date Occupation Industry  Not on file Not on file Not on file   Travel History Travel Start Travel End  No recent travel history available.     Last Filed Vital Signs - documented in this encounter Vital Sign Reading Time Taken Comments  Blood Pressure 96/60 07/07/2018 9:51 AM EST   Pulse 70 07/07/2018 9:51 AM EST   Temperature - -   Respiratory Rate - -   Oxygen Saturation - -   Inhaled Oxygen Concentration - -   Weight 100.2 kg (221 lb) 07/07/2018 9:51 AM EST   Height 193 cm (6\' 4" ) 07/07/2018 9:51 AM EST   Body Mass Index 26.9 07/07/2018 9:51 AM EST    Patient Instructions - documented in this encounter Patient Instructions Isaias Cowman, MD - 07/07/2018 10:00 AM EST   Patient Education    DASH Diet: Care Instructions Your Care Instructions  The DASH diet is an eating plan that can help lower your blood pressure. DASH stands for Dietary Approaches to Stop  Hypertension. Hypertension is high blood pressure. The DASH diet focuses on eating foods that are high in calcium, potassium, and magnesium. These nutrients can lower blood pressure. The foods that are highest in these nutrients are fruits,  vegetables, low-fat dairy products, nuts, seeds, and legumes. But taking calcium, potassium, and magnesium supplements instead of eating foods that are high in those nutrients does not have the same effect. The DASH diet also includes whole grains, fish, and poultry. The DASH diet is one of several lifestyle changes your doctor may recommend to lower your high blood pressure. Your doctor may also want you to decrease the amount of sodium in your diet. Lowering sodium while following the DASH diet can lower blood pressure even further than just the DASH diet alone. Follow-up care is a key part of your treatment and safety. Be sure to make and go to all appointments, and call your doctor if you are having problems. It's also a good idea to know your test results and keep a list of the medicines you take. How can you care for yourself at home? Following the DASH diet  Eat 4 to 5 servings of fruit each day. A serving is 1 medium-sized piece of fruit,  cup chopped or canned fruit, 1/4 cup dried fruit, or 4 ounces ( cup) of fruit juice. Choose fruit more often than fruit juice.  Eat 4 to 5 servings of vegetables each day. A serving is 1 cup of lettuce or raw leafy vegetables,  cup of chopped or cooked vegetables, or 4 ounces ( cup) of vegetable juice. Choose vegetables more often than vegetable juice.  Get 2 to 3 servings of low-fat and fat-free dairy each day. A serving is 8 ounces of milk, 1 cup of yogurt, or 1  ounces of cheese.  Eat 6 to 8 servings of grains each day. A serving is 1 slice of bread, 1 ounce of dry cereal, or  cup of cooked rice, pasta, or cooked cereal. Try to choose whole-grain products as much as possible.  Limit lean meat, poultry, and fish to 2 servings each day. A serving is 3 ounces, about the size of a deck of cards.  Eat 4 to 5 servings of nuts, seeds, and legumes (cooked dried beans, lentils, and split peas) each week. A serving is 1/3 cup of nuts, 2 tablespoons of seeds,  or  cup of cooked beans or peas.  Limit fats and oils to 2 to 3 servings each day. A serving is 1 teaspoon of vegetable oil or 2 tablespoons of salad dressing.  Limit sweets and added sugars to 5 servings or less a week. A serving is 1 tablespoon jelly or jam,  cup sorbet, or 1 cup of lemonade.  Eat less than 2,300 milligrams (mg) of sodium a day. If you limit your sodium to 1,500 mg a day, you can lower your blood pressure even more. Tips for success  Start small. Do not try to make dramatic changes to your diet all at once. You might feel that you are missing out on your favorite foods and then be more likely to not follow the plan. Make small changes, and stick with them. Once those changes become habit, add a few more changes.  Try some of the following: ? Make it a goal to eat a fruit or vegetable at every meal and at snacks. This will make it easy to get the recommended amount of fruits and vegetables each day. ? Try yogurt topped with  fruit and nuts for a snack or healthy dessert. ? Add lettuce, tomato, cucumber, and onion to sandwiches. ? Combine a ready-made pizza crust with low-fat mozzarella cheese and lots of vegetable toppings. Try using tomatoes, squash, spinach, broccoli, carrots, cauliflower, and onions. ? Have a variety of cut-up vegetables with a low-fat dip as an appetizer instead of chips and dip. ? Sprinkle sunflower seeds or chopped almonds over salads. Or try adding chopped walnuts or almonds to cooked vegetables. ? Try some vegetarian meals using beans and peas. Add garbanzo or kidney beans to salads. Make burritos and tacos with mashed pinto beans or black beans. Where can you learn more? Log in to your [facility] MyChart account at [email or web address] and click on top menu option "Health" then select "Search Medical Library". Enter [phone] in the search box and click the magnify glass to learn more about "DASH Diet: Care Instructions." Current as of: August 18, 2017 Content Version: 12.3  2006-2019 [employer], Incorporated. Care instructions adapted under license by your healthcare professional. If you have questions about a medical condition or this instruction, always ask your healthcare professional. [place] any warranty or liability for your use of this information.      Electronically signed by Isaias Cowman, MD at 07/07/2018 10:35 AM EST     Progress Notes - documented in this encounter Isaias Cowman, MD - 07/07/2018 10:00 AM EST Formatting of this note might be different from the original. Established Patient Visit   Chief Complaint: Chief Complaint  Patient presents with   Follow-up  to talk about a pacemaker   Loss of Consciousness  having more spells  Date of Service: 07/07/2018 Date of Birth: 25-Jan-1929 PCP: Glendon Axe, MD  History of Present Illness: Michael Holloway is a 85 y.o.male patient who returns for recurrent presyncope, sinus pauses and essential hypertension, status post Linq implantation on 06/03/2017. The patient has a history of peripheral neuropathy with extensive degenerative disc disease status post multiple back surgeries. He has had a 1-year history of recurrent episodes of lightheadedness, fatigue and postural dizziness. The patient had a presyncopal episode while singing in the choir in church. Patient also had a presyncopal episode, fell to the floor in his kitchen, without complete loss of consciousness. He reports 2 episodes of lightheadedness per month. He saw his primary care provider; ECG revealed sinus rhythm, but computerized reading was atrial fibrillation, and the patient was sent to [facility] ED, where he was noted to be in sinus rhythm. Previous 24-hour Holter monitor on 04/10/2016 revealed normal sinus rhythm, with frequent PVCs and PACs, with one 7 beat atrial run.  Patient returns today, reports more frequent episodes of lightheadedness, with near syncope,  especially in the morning, associated with weakness. Patient also reports experiencing heaviness with exercise. Denies chest pain or shortness of breath. He denies palpitations or heart racing. He denies peripheral edema. Linq downloads 11/11/2017 to 01/04/2018, 01/18/2018 to 01/30/2018, 06/02/2020 - 2/21 2020 revealed 124 pauses lifetime, longest recent pause of 4.8 seconds on 06/30/2018.  The patient has essential hypertension, blood pressure low normal on losartan and amlodipine. Amlodipine was recently decreased from 10 to 5 mg because of apparent low blood pressure episodes. The patient follows a low-sodium, no added salt diet.  Past Medical and Surgical History  Past Medical History Past Medical History:  Diagnosis Date   Allergic state   Barrett's esophagus 2001   BPH (benign prostatic hyperplasia)  s/p TUNA procedure   Cancer (CMS-HCC) 1980  Basil Cell  Carotid artery occlusion 2013  Partial Blockage left carotid artery   Cataract cortical, senile 2002  Lens emplant   Chronic renal insufficiency   DJD (degenerative joint disease) of knee  bilateral knees   GERD (gastroesophageal reflux disease)   Gout   Hyperlipidemia   Hypertension   Kidney disease 2003   Macular degeneration   Neuropathy of left lower extremity   Other allergic rhinitis   Past Surgical History He has a past surgical history that includes Cholecystectomy (1985); Replacement total knee (Left, 2008); TUNA procedure; Laminectomy Lumbar Spine; Posterior laminectomy / decompression cervical spine (07/03/2004); Colonoscopy (09/18/1997); Colonoscopy (04/18/2002); Colonoscopy (09/08/2005); egd (09/18/1997); egd (12/29/2011, 11/02/2008, 09/08/2005, 10/26/2000); lens eye surgery (Bilateral); Prostate surgery (TUNA 1997); Cataract extraction (2004); Tonsillectomy (1945); Joint replacement (2008); and Knee arthroscopy (2001).   Medications and Allergies  Current Medications  Current Outpatient Medications   Medication Sig Dispense Refill   allopurinoL (ZYLOPRIM) 100 MG tablet Take 1 tablet (100 mg total) by mouth once daily 90 tablet 1   amLODIPine (NORVASC) 10 MG tablet Take 0.5 tablets (5 mg total) by mouth once daily 90 tablet 3   aspirin 81 MG EC tablet Take 81 mg by mouth once daily   biotin 10,000 mcg Cap Take by mouth   diphenhydrAMINE (BENADRYL) 25 mg capsule Take 25 mg by mouth every 6 (six) hours as needed for Itching.   FOLIC ACID/MULTIVIT-MIN/LUTEIN (CENTRUM SILVER ORAL) Take 1 tablet by mouth once daily.   gabapentin (NEURONTIN) 600 MG tablet Take 1 tablet (600 mg total) by mouth 3 (three) times daily 270 tablet 1   HYDROcodone-acetaminophen (NORCO) 5-325 mg tablet   losartan (COZAAR) 50 MG tablet Take 1 tablet (50 mg total) by mouth once daily 90 tablet 1   omeprazole (PRILOSEC) 20 MG DR capsule Take 1 capsule (20 mg total) by mouth once daily as needed 90 capsule 1   traMADol (ULTRAM) 50 mg tablet Take 1 tablet (50 mg total) by mouth every 8 (eight) hours as needed for Pain 90 tablet 1   vit C/E/Zn/coppr/lutein/zeaxan (PRESERVISION AREDS-2 ORAL) Take 1 capsule by mouth 2 (two) times daily   No current facility-administered medications for this visit.   Allergies: Sulfa (sulfonamide antibiotics)  Social and Family History  Social History reports that he quit smoking about 40 years ago. He started smoking about 72 years ago. He has a 15.00 pack-year smoking history. He has never used smokeless tobacco. He reports current alcohol use of about 7.0 - 10.0 standard drinks of alcohol per week. He reports that he does not use drugs.  Family History Family History  Problem Relation Age of Onset   Lung cancer Mother   Obesity Mother   Stroke Father   Macular degeneration Cousin   Diabetes type II Maternal Aunt   Diabetes type I Daughter   Diabetes type II Daughter   Glaucoma Neg Hx   Blindness Neg Hx   High blood pressure (Hypertension) Neg Hx   Cataracts  Neg Hx   Thyroid disease Neg Hx   Vision loss Neg Hx   Review of Systems   Review of Systems: The patient denies chest pain, shortness of breath, orthopnea, paroxysmal nocturnal dyspnea, pedal edema, palpitations, heart racing, reports presyncope, syncope, with postural lightheadedness. Review of 12 Systems is negative except as described above.  Physical Examination   Vitals:BP 96/60   Pulse 70   Ht 193 cm (6\' 4" )   Wt 100.2 kg (221 lb)   BMI 26.90 kg/m  Ht:193  cm (6\' 4" ) Wt:100.2 kg (221 lb) YWV:PXTG surface area is 2.32 meters squared. Body mass index is 26.9 kg/m.  General: Alert and oriented. Well-appearing. No acute distress. HEENT: Pupils equally reactive to light and accomodation  Neck: Supple, no JVD Lungs: Normal effort of breathing; clear to auscultation bilaterally; no wheezes, rales, rhonchi Heart: Regular rate and rhythm. No murmur, rub, or gallop Incision site: Left upper chest wall, steri strips intact and removed, revealing well-healing insertion site without surrounding erythema, warmth, edema, or active drainage or bleeding. Abdomen: nondistended, with normal bowel sounds Extremities: no cyanosis, clubbing, or edema Peripheral Pulses: 2+ radial Skin: Warm, dry, no diaphoresis  Assessment   85 y.o. male with  1. Syncope, unspecified syncope type  2. Benign essential hypertension  3. Essential hypertension  4. Near syncope  5. CKD (chronic kidney disease) stage 3, GFR 30-59 ml/min (CMS-HCC)  6. Hyperlipidemia, unspecified hyperlipidemia type  7. Sinus pause  8. Sick sinus syndrome (CMS-HCC)   85 year old gentleman with infrequent recurrent presyncopal episodes with near loss of consciousness, with unremarkable previous 24-hour Holter monitor. Linq interrogation did not reveal episodes of atrial fibrillation, however it did show sinus pauses the longest lasting up to 5 seconds. Patient returns today, reports more frequent episodes of lightheadedness, near  syncope, and generalized weakness. We discussed in detail the risks, benefits and alternatives of permanent pacemaker implantation, and the patient wishes to proceed.. The patient has essential hypertension, blood pressure well controlled on current BP medications.  Plan   1. Continue current medications 2. Counseled patient about low-sodium diet 3. DASH diet printed instructions given to the patient 4. Proceed with permanent pacemaker implantation. We discussed the risk, benefits and alternatives of permanent pacemaker implantation, and informed written consent was obtained.  Orders Placed This Encounter  Procedures   ECG 12-lead   Return in about 1 week (around 07/14/2018), or after pacemaker.  Isaias Cowman, MD PhD [facility]   Electronically signed by Isaias Cowman, MD at 07/07/2018 10:42 AM EST   Plan of Treatment - documented as of this encounter Upcoming Encounters Upcoming Encounters  Date Type Specialty Care Team Description  09/14/2018 Office Visit [place], [place], [place]  [place], [place] [address]  [phone]  [phone] (Fax)    12/16/2018 Ancillary Orders Lab Glendon Axe, [place]  [facility] [place], [place] [address]  [phone]  [phone] (Fax)    12/23/2018 Office Visit Internal Medicine Glendon Axe, [place]  [facility] [place], [place] [address]  [phone]  [phone] (Fax)    01/20/2019 Office Visit Ophthalmology Janna Arch, [place]  [place], [place] [address]  [phone]  [phone] (Fax)     Procedures - documented in this encounter Procedure Name Priority Date/Time Associated Diagnosis Comments  PR ELECTROCARDIOGRAM, COMPLETE Routine 07/07/2018 11:23 AM EST Syncope, unspecified syncope type  Results for this procedure are in the results section.    ECG Results - documented in this encounter  ECG  12-lead (07/07/2018 11:23 AM EST) ECG 12-lead (07/07/2018 11:23 AM EST)  Component Value Ref Range Performed At Pathologist Signature  Vent Rate (bpm) 73  [facility] GE MUSE RESULTS   PR Interval (msec) 210  [facility] GE MUSE RESULTS   QRS Interval (msec) 88  [facility] GE MUSE RESULTS   QT Interval (msec) 390  [facility] GE MUSE RESULTS   QTc (msec) 429  [facility] GE MUSE RESULTS    ECG 12-lead (  07/07/2018 11:23 AM EST)  Specimen     ECG 12-lead (07/07/2018 11:23 AM EST)  Narrative Performed At  This result has an attachment that is not available.  Sinus rhythm 1st degree AV block with occasional and consecutive premature ventricular complexes Abnormal ECG When compared with ECG of 01-May-2017 11:30, Sinus rhythm has replaced Atrial fibrillation I reviewed and concur with this report. Electronically signed BZ:[email or web address], MD, Cristie Hem ([id number]) on 07/30/2018 9:05:21 AM   [facility] GE MUSE RESULTS    ECG 12-lead (07/07/2018 11:23 AM EST)  Performing Organization Address City/State/Zipcode Phone Number  [place]         Visit Diagnoses - documented in this encounter Diagnosis  Syncope, unspecified syncope type - Primary   Benign essential hypertension  Essential hypertension, benign   Essential hypertension   Near syncope   CKD (chronic kidney disease) stage 3, GFR 30-59 ml/min (CMS-HCC)  Chronic kidney disease, Stage III (moderate)   Hyperlipidemia, unspecified hyperlipidemia type   Sinus pause   Sick sinus syndrome (CMS-HCC)  Sinoatrial node dysfunction    Historical Medications - added in this encounter Reconcile with Patient's Chart This list may reflect changes made after this encounter.  Medication Sig Dispensed Refills Start Date End Date  HYDROcodone-acetaminophen ([place]) 5-325 mg tablet    0 06/14/2018   Images  Patient Contacts  Contact Name Contact Address Communication Relationship to Patient  Geovanie Winnett. Unknown [phone]  (Home) [phone] (Mobile) [email or web address] Son or Daughter, Emergency Contact  Document Information  Primary Care Provider Other Service Providers Document Coverage Dates  Glendon Axe, MD (Apr. 26, 2016April 26, 2016 - Present) DM: [phone] (Work) [phone] (Fax) [facility] [place], [place] [address] Internal Medicine [facility] [address] [place], [place] [address]  Feb. 26, 2020February 26, 2020 - Mar. 01, 2020March 01, [place] [address] [place], [place] [address]   Encounter Providers Encounter Date  Isaias Cowman, MD (Attending) DM: [phone] (Work) [phone] (Fax) [address] [facility] [place], [place] [address] Cardiovascular Disease Feb. 26, 2020February 26, 2020 - Mar. 01, 2020March 01, 2020    Show All Sections

## 2018-08-03 NOTE — Interval H&P Note (Signed)
History and Physical Interval Note:  08/03/2018 12:34 PM  Michael Holloway  has presented today for surgery, with the diagnosis of SSS/PAUSES/SYNCOPE.  The various methods of treatment have been discussed with the patient and family. After consideration of risks, benefits and other options for treatment, the patient has consented to  Procedure(s): [place] (Left) as a surgical intervention.  The patient's history has been reviewed, patient examined, no change in status, stable for surgery.  I have reviewed the patient's chart and labs.  Questions were answered to the patient's satisfaction.     Nyilah Kight [employer]

## 2018-08-03 NOTE — Op Note (Signed)
[facility] Cardiology   08/03/2018                     2:27 PM  PATIENT:  Michael Holloway    PRE-OPERATIVE DIAGNOSIS:  SSS/PAUSES/SYNCOPE  POST-OPERATIVE DIAGNOSIS:  Same  PROCEDURE:  INSERTION PACEMAKER DUALCHAMBER  SURGEON:  Isaias Cowman, MD    ANESTHESIA:     PREOPERATIVE INDICATIONS:  Michael Holloway is a  85 y.o. male with a diagnosis of SSS/PAUSES/SYNCOPE who failed conservative measures and elected for surgical management.    The risks benefits and alternatives were discussed with the patient preoperatively including but not limited to the risks of infection, bleeding, cardiopulmonary complications, the need for revision surgery, among others, and the patient was willing to proceed.   OPERATIVE PROCEDURE: The patient was brought to the operating room fasting state.  The left pectoral region was prepped and draped in usual sterile manner.  Anesthesia was obtained 1% lidocaine locally.  A 6 cm incision was performed the left pectoral region.  The pacemaker pocket was generated electrocautery and blunt dissection.  Access was obtained to the left subclavian vein by fine-needle aspiration.  MRI compatible leads were positioned to the right ventricular apical septum ( Medtronic [id number] ) and right atrial appendage ( Medtronic [id number] ) under fluoroscopic guidance.  After proper thresholds were obtained the leads were sutured in place with 0 silk.  The leads were connected to a MRI compatible dual-chamber rate responsive pacemaker generator ( Medtronic [id number] ).  The pacemaker pocket was irrigated gentamicin solution.  Pacemaker generator was positioned into the pocket the pocket was closed with 2-0 and 4-0 Vicryl, respectively.  Steri-Strips or pressure dressing were applied.  Postprocedural interrogation revealed appropriate dual-chamber atrial and ventricular pacing and sensing thresholds.  There were no periprocedural complications.

## 2018-08-03 NOTE — Anesthesia Post-op Follow-up Note (Signed)
Anesthesia QCDR form completed.        

## 2018-08-03 NOTE — Anesthesia Preprocedure Evaluation (Signed)
Anesthesia Evaluation  Patient identified by MRN, date of birth, ID band Patient awake    Reviewed: Allergy & Precautions, NPO status , Patient's Chart, lab work & pertinent test results  History of Anesthesia Complications Negative for: history of anesthetic complications  Airway Mallampati: II       Dental  (+) Partial Lower   Pulmonary neg sleep apnea, neg COPD, former smoker,           Cardiovascular hypertension, Pt. on medications (-) Past MI and (-) CHF + dysrhythmias (sick sinus syndrome, long pauses)      Neuro/Psych neg Seizures    GI/Hepatic GERD  Medicated and Controlled,  Endo/Other  neg diabetes  Renal/GU Renal InsufficiencyRenal disease     Musculoskeletal   Abdominal   Peds  Hematology   Anesthesia Other Findings   Reproductive/Obstetrics                            Anesthesia Physical Anesthesia Plan  ASA: III  Anesthesia Plan: General   Post-op Pain Management:    Induction: Intravenous  PONV Risk Score and Plan: 2 and Dexamethasone and Ondansetron  Airway Management Planned: Nasal Cannula  Additional Equipment:   Intra-op Plan:   Post-operative Plan:   Informed Consent: I have reviewed the patients History and Physical, chart, labs and discussed the procedure including the risks, benefits and alternatives for the proposed anesthesia with the patient or authorized representative who has indicated his/her understanding and acceptance.       Plan Discussed with:   Anesthesia Plan Comments:         Anesthesia Quick Evaluation

## 2018-08-03 NOTE — Anesthesia Procedure Notes (Signed)
Date/Time: 08/03/2018 1:09 PM Performed by: Doreen Salvage, CRNA Pre-anesthesia Checklist: Patient identified, Emergency Drugs available, Suction available and Patient being monitored Patient Re-evaluated:Patient Re-evaluated prior to induction Oxygen Delivery Method: Nasal cannula Induction Type: IV induction Dental Injury: Teeth and Oropharynx as per pre-operative assessment  Comments: Nasal cannula with etCO2 monitoring

## 2018-08-04 ENCOUNTER — Ambulatory Visit: Payer: Self-pay | Admitting: Urology

## 2018-08-04 ENCOUNTER — Encounter: Payer: Self-pay | Admitting: Cardiology

## 2018-08-04 DIAGNOSIS — I495 Sick sinus syndrome: Secondary | ICD-10-CM | POA: Diagnosis not present

## 2018-08-04 MED ORDER — DIPHENHYDRAMINE HCL 25 MG PO CAPS
25.0000 mg | ORAL_CAPSULE | Freq: Every evening | ORAL | Status: DC | PRN
  Administered 2018-08-04: 25 mg via ORAL
  Filled 2018-08-04: qty 1

## 2018-08-04 MED ORDER — CEPHALEXIN 250 MG PO CAPS: 500.0000 mg | ORAL_CAPSULE | Freq: Two times a day (BID) | ORAL | 0 refills | Status: DC

## 2018-08-04 NOTE — Discharge Instructions (Signed)
Pacemaker Implantation, Adult, Care After This sheet gives you information about how to care for yourself after your procedure. Your health care provider may also give you more specific instructions. If you have problems or questions, contact your health care provider. What can I expect after the procedure? After the procedure, it is common to have:  Mild pain.  Slight bruising.  Some swelling over the incision.  A slight bump over the skin where the device was placed. Sometimes, it is possible to feel the device under the skin. This is normal. Follow these instructions at home: Medicines  Take over-the-counter and prescription medicines only as told by your health care provider.  If you were prescribed an antibiotic medicine, take it as told by your health care provider. Do not stop taking the antibiotic even if you start to feel better. Wound care   Do not remove the bandage on your chest until directed to do so by your health care provider.  After your bandage is removed, you may see pieces of tape called skin adhesive strips over the area where the cut was made (incision site). Let them fall off on their own.  Check the incision site every day to make sure it is not infected, bleeding, or starting to pull apart.  Do not use lotions or ointments near the incision site unless directed to do so.  Keep the incision area clean and dry for 2-3 days after the procedure or as directed by your health care provider. It takes several weeks for the incision site to completely heal.  Do not take baths, swim, or use a hot tub for 7-10 days or as otherwise directed by your health care provider. Activity  Do not drive or use heavy machinery while taking prescription pain medicine.  Do not drive for 24 hours if you were given a medicine to help you relax (sedative).  Check with your health care provider before you start to drive or play sports.  Avoid sudden jerking, pulling, or  chopping movements that pull your upper arm far away from your body. Avoid these movements for at least 6 weeks or as long as told by your health care provider.  Do not lift your upper arm above your shoulders for at least 6 weeks or as long as told by your health care provider. This means no tennis, golf, or swimming.  You may go back to work when your health care provider says it is okay. Pacemaker care  You may be shown how to transfer data from your pacemaker through the phone to your health care provider.  Always let all health care providers know about your pacemaker before you have any medical procedures or tests.  Wear a medical ID bracelet or necklace stating that you have a pacemaker. Carry a pacemaker ID card with you at all times.  Your pacemaker battery will last for 5-15 years. Routine checks by your health care provider will let the health care provider know when the battery is starting to run down. The pacemaker will need to be replaced when the battery starts to run down.  Do not use amateur Chief of Staff. Other electrical devices are safe to use, including power tools, lawn mowers, and speakers. If you are unsure of whether something is safe to use, ask your health care provider.  When using your cell phone, hold it to the ear opposite the pacemaker. Do not leave your cell phone in a pocket over the  pacemaker.  Avoid places or objects that have a strong electric or magnetic field, including: ? Airport Herbalist. When at the airport, let officials know that you have a pacemaker. ? Power plants. ? Large electrical generators. ? Radiofrequency transmission towers, such as cell phone and radio towers. General instructions  Weigh yourself every day. If you suddenly gain weight, fluid may be building up in your body.  Keep all follow-up visits as told by your health care provider. This is important. Contact a health care provider if:  You  gain weight suddenly.  Your legs or feet swell.  It feels like your heart is fluttering or skipping beats (heart palpitations).  You have chills or a fever.  You have more redness, swelling, or pain around your incisions.  You have more fluid or blood coming from your incisions.  Your incisions feel warm to the touch.  You have pus or a bad smell coming from your incisions. Get help right away if:  You have chest pain.  You have trouble breathing or are short of breath.  You become extremely tired.  You are light-headed or you faint. This information is not intended to replace advice given to you by your health care provider. Make sure you discuss any questions you have with your health care provider. Document Released: 11/15/2004 Document Revised: 02/08/2016 Document Reviewed: 02/08/2016 Elsevier Interactive Patient Education  [employer]. Patient may shower 08/05/2018.  Leave Steri-Strips on.  Avoid lifting left arm above head.

## 2018-08-04 NOTE — Discharge Summary (Signed)
Physician Discharge Summary  Patient ID: LAKOTA MARKGRAF MRN: [id number] DOB/AGE: 83/12/1928 83 y.o.  Admit date: 08/03/2018 Discharge date: 08/04/2018  Primary Discharge Diagnosis sick sinus syndrome Secondary Discharge Diagnosis syncope  Significant Diagnostic Studies:  None  Consults: None  Hospital Course: The patient underwent dual-chamber pacemaker implantation for symptomatic sinus pauses on 08/03/2018.  There were no periprocedural complications.  The patient was observed overnight on telemetry with an uncomplicated hospital course.  Postprocedural chest x-ray did not reveal evidence for pneumothorax.  Postprocedure ECG revealed atrial pacing with ventricular sensing.  On the morning of 08/04/2018 the patient was ambulating without difficulty and was discharged home.   Discharge Exam: Blood pressure 136/64, pulse 61, temperature 98.2 F (36.8 C), temperature source Oral, resp. rate 19, height 6\' 4"  (1.93 m), weight 97 kg, SpO2 99 %.   General appearance: alert Head: Normocephalic, without obvious abnormality, atraumatic Eyes: conjunctivae/corneas clear. PERRL, EOM's intact. Fundi benign. Ears: normal TM's and external ear canals both ears Nose: Nares normal. Septum midline. Mucosa normal. No drainage or sinus tenderness. Throat: lips, mucosa, and tongue normal; teeth and gums normal Neck: no adenopathy, no carotid bruit, no JVD, supple, symmetrical, trachea midline and thyroid not enlarged, symmetric, no tenderness/mass/nodules Back: symmetric, no curvature. ROM normal. No CVA tenderness. Resp: clear to auscultation bilaterally Chest wall: no tenderness Cardio: regular rate and rhythm, S1, S2 normal, no murmur, click, rub or gallop GI: soft, non-tender; bowel sounds normal; no masses,  no organomegaly Extremities: extremities normal, atraumatic, no cyanosis or edema Pulses: 2+ and symmetric Skin: Skin color, texture, turgor normal. No rashes or lesions Lymph nodes:  Cervical, supraclavicular, and axillary nodes normal. Neurologic: Grossly normal Labs:   Lab Results  Component Value Date   WBC 7.2 07/20/2018   HGB 13.2 07/20/2018   HCT 40.9 07/20/2018   MCV 88.0 07/20/2018   PLT 273 07/20/2018   No results for input(s): NA, K, CL, CO2, BUN, CREATININE, CALCIUM, PROT, BILITOT, ALKPHOS, ALT, AST, GLUCOSE in the last 168 hours.  Invalid input(s): LABALBU    Radiology: No pneumothorax EKG: Atrial pacing with ventricular sensing  FOLLOW UP PLANS AND APPOINTMENTS  Allergies as of 08/04/2018      Reactions   Sulfa Antibiotics Rash      Medication List    TAKE these medications   allopurinol 100 MG tablet Commonly known as:  ZYLOPRIM Take 100 mg by mouth daily.   amLODipine 10 MG tablet Commonly known as:  NORVASC Take 5 mg by mouth at bedtime.   aspirin EC 81 MG tablet Take 81 mg by mouth daily.   Biotin 10 MG Caps Take 10 mg by mouth daily.   CENTRUM ADULTS PO Take 1 tablet by mouth daily.   PRESERVISION AREDS 2 PO Take 1 capsule by mouth 2 (two) times daily.   cephALEXin 250 MG capsule Commonly known as:  Keflex Take 2 capsules (500 mg total) by mouth 2 (two) times daily.   diphenhydrAMINE 25 MG tablet Commonly known as:  BENADRYL Take 25 mg by mouth daily as needed for allergies.   gabapentin 600 MG tablet Commonly known as:  NEURONTIN Take 600 mg by mouth 3 (three) times daily.   losartan 50 MG tablet Commonly known as:  COZAAR Take 50 mg by mouth at bedtime.   omeprazole 20 MG capsule Commonly known as:  PRILOSEC Take 20 mg by mouth daily.   traMADol 50 MG tablet Commonly known as:  ULTRAM Take 50 mg by mouth 3 (  three) times daily as needed for moderate pain.      Follow-up Information    Stormy Sabol, MD Follow up in 1 week(s).   Specialty:  Cardiology Contact information: [facility] [place] [address] [phone]           BRING ALL  MEDICATIONS WITH YOU TO FOLLOW UP APPOINTMENTS  Time spent with patient to include physician time: 25 minutes Signed:  Isaias Cowman MD, PhD, [facility] 08/04/2018, 7:57 AM

## 2018-08-04 NOTE — Progress Notes (Signed)
Cardiologist on call never called back.  This RN will consult charge nurse about what to do.

## 2018-08-04 NOTE — Progress Notes (Signed)
Pt escorted to POV via W/C upon discharge.  Discharge instructions, prescription, and personal belongings in his possession.

## 2018-08-05 NOTE — Anesthesia Postprocedure Evaluation (Signed)
Anesthesia Post Note  Patient: Michael Holloway  Procedure(s) Performed: INSERTION PACEMAKER DUALCHAMBER (Left )  Patient location during evaluation: PACU Anesthesia Type: General Level of consciousness: awake and alert Pain management: pain level controlled Vital Signs Assessment: post-procedure vital signs reviewed and stable Respiratory status: spontaneous breathing, nonlabored ventilation, respiratory function stable and patient connected to nasal cannula oxygen Cardiovascular status: stable and blood pressure returned to baseline Postop Assessment: no apparent nausea or vomiting Anesthetic complications: no     Last Vitals:  Vitals:   08/04/18 0352 08/04/18 0740  BP: (!) 118/56 136/64  Pulse: 60 61  Resp: 16 19  Temp: 36.8 C 36.8 C  SpO2: 96% 99%    Last Pain:  Vitals:   08/04/18 0740  TempSrc: Oral  PainSc: 0-No pain                 Molli Barrows

## 2018-08-11 ENCOUNTER — Ambulatory Visit: Payer: Self-pay | Admitting: Urology

## 2018-08-11 DIAGNOSIS — Z950 Presence of cardiac pacemaker: Secondary | ICD-10-CM | POA: Insufficient documentation

## 2018-08-18 ENCOUNTER — Ambulatory Visit: Payer: Self-pay | Admitting: Urology

## 2018-08-25 ENCOUNTER — Ambulatory Visit: Payer: Self-pay | Admitting: Urology

## 2018-09-01 ENCOUNTER — Ambulatory Visit: Payer: Self-pay | Admitting: Urology

## 2018-09-08 ENCOUNTER — Ambulatory Visit: Payer: BLUE CROSS/BLUE SHIELD | Admitting: Urology

## 2018-09-13 ENCOUNTER — Telehealth: Payer: Self-pay | Admitting: Urology

## 2018-09-15 ENCOUNTER — Ambulatory Visit: Payer: BLUE CROSS/BLUE SHIELD | Admitting: Urology

## 2018-10-08 NOTE — Telephone Encounter (Signed)
error 

## 2018-12-01 ENCOUNTER — Ambulatory Visit (INDEPENDENT_AMBULATORY_CARE_PROVIDER_SITE_OTHER): Payer: Medicare Other | Admitting: Urology

## 2018-12-01 ENCOUNTER — Other Ambulatory Visit: Payer: Self-pay

## 2018-12-01 ENCOUNTER — Encounter: Payer: Self-pay | Admitting: Urology

## 2018-12-01 VITALS — BP 134/67 | HR 78 | Ht 76.0 in | Wt 215.0 lb

## 2018-12-01 DIAGNOSIS — Z8551 Personal history of malignant neoplasm of bladder: Secondary | ICD-10-CM

## 2018-12-01 LAB — URINALYSIS, COMPLETE
Bilirubin, UA: NEGATIVE
Glucose, UA: NEGATIVE
Ketones, UA: NEGATIVE
Leukocytes,UA: NEGATIVE
Nitrite, UA: NEGATIVE
Protein,UA: NEGATIVE
RBC, UA: NEGATIVE
Specific Gravity, UA: 1.01 (ref 1.005–1.030)
Urobilinogen, Ur: 0.2 mg/dL (ref 0.2–1.0)
pH, UA: 5.5 (ref 5.0–7.5)

## 2018-12-01 LAB — MICROSCOPIC EXAMINATION
Bacteria, UA: NONE SEEN
RBC, Urine: NONE SEEN /hpf (ref 0–2)

## 2018-12-01 NOTE — Progress Notes (Signed)
   12/01/2018  CC: cysto  HPI: 85 y.o. 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  In 03/2017, he was noted to have small recurrences most consistent with low-grade noninvasive lesions.  He is now undergone 3 office procedures for an office fulguration for low-grade superficial appearing tumors including on 03/31/2017, 05/2016, and 11/2017.  Today for routine surveillance cystoscopy.  Most recent upper tract imaging in the form of CT abdomen and pelvis without contrast on 3/18 which was unremarkable. Bilateral retrograde pyelogram in the operating room of 08/2016 also negative.  Most recently, he returned to the operating room on 06/2018 for repeat TURBT for small tumors involving the prostatic urethra and bladder neck, small.  Tumors were primarily low-grade with some high-grade features, superficial.  Muscularis propria was present but not involved.  Intravesical gemcitabine was instilled.  Postoperatively, the plan was to undergo induction course of BCG, however in light of COVID-19 global pandemic, these have been deferred in light of PPE issues.  He does smoking history, 3/4 ppd x 20 years. Quit in 1980.   Blood pressure 134/67, pulse 78, height 6\' 4"  (1.93 m), weight 215 lb (97.5 kg). NED. A&Ox3.  No respiratory distress  Abd soft, NT, ND Normal phallus with bilateral descended testicles  Cystoscopy Procedure Note  Patient identification was confirmed, informed consent was obtained, and patient was prepped using Betadine solution.   Preoperative abx where received prior to procedure.   Pre-Procedure: - Inspection reveals a normal caliber ureteral meatus.  Procedure: The flexible cystoscope was introduced without difficulty - 1cm tumor in prostatic fossa near the prostatic apex on the left - Enlargedprostate trilobar coaptation, small adherent necrotic debris just proximal to the verumontanum at the site of previous  resection but no obvious recurrence within the prostatic fossa - Mildly elevatedbladder neck - Bilateral ureteral orifices identified - Bladder mucosareveals multiple small papillary lesions on the dome of the bladder at site of previous recurrences, approximately 3 or 4 as well as one small lesion on the left dome, low-grade appearing - Mild trabeculation  Retroflexion shows intravesical protrusion the median lobe  Post-Procedure: - Patient tolerated the procedure well  Assessment/ Plan:  1. 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  He has been through this procedure and is been well-tolerated in the past.  Return 2 weeks for this.  In addition to the above, we now have the appropriate PPE and BCG in stock this after this fulguration, will pursue induction course of BCG.  We previously discussed the risk and benefits and he is willing to proceed as planned.  - Urinalysis, Complete  Hollice Espy, MD

## 2018-12-16 ENCOUNTER — Other Ambulatory Visit: Payer: Self-pay

## 2018-12-16 ENCOUNTER — Emergency Department
Admission: EM | Admit: 2018-12-16 | Discharge: 2018-12-16 | Disposition: A | Discharge status: Home / Self Care | Payer: Medicare Other | Attending: Emergency Medicine | Admitting: Emergency Medicine

## 2018-12-16 ENCOUNTER — Ambulatory Visit (INDEPENDENT_AMBULATORY_CARE_PROVIDER_SITE_OTHER): Payer: Medicare Other | Admitting: Urology

## 2018-12-16 VITALS — BP 81/38 | HR 75

## 2018-12-16 DIAGNOSIS — E861 Hypovolemia: Secondary | ICD-10-CM | POA: Diagnosis not present

## 2018-12-16 DIAGNOSIS — I9589 Other hypotension: Secondary | ICD-10-CM | POA: Insufficient documentation

## 2018-12-16 DIAGNOSIS — Z85828 Personal history of other malignant neoplasm of skin: Secondary | ICD-10-CM | POA: Insufficient documentation

## 2018-12-16 DIAGNOSIS — Z87891 Personal history of nicotine dependence: Secondary | ICD-10-CM | POA: Diagnosis not present

## 2018-12-16 DIAGNOSIS — N183 Chronic kidney disease, stage 3 (moderate): Secondary | ICD-10-CM | POA: Diagnosis not present

## 2018-12-16 DIAGNOSIS — Z8551 Personal history of malignant neoplasm of bladder: Secondary | ICD-10-CM

## 2018-12-16 DIAGNOSIS — Z79899 Other long term (current) drug therapy: Secondary | ICD-10-CM | POA: Diagnosis not present

## 2018-12-16 DIAGNOSIS — Z7982 Long term (current) use of aspirin: Secondary | ICD-10-CM | POA: Diagnosis not present

## 2018-12-16 DIAGNOSIS — I129 Hypertensive chronic kidney disease with stage 1 through stage 4 chronic kidney disease, or unspecified chronic kidney disease: Secondary | ICD-10-CM | POA: Insufficient documentation

## 2018-12-16 LAB — MICROSCOPIC EXAMINATION: RBC: NONE SEEN /hpf (ref 0–2)

## 2018-12-16 LAB — URINALYSIS, COMPLETE
Bilirubin, UA: NEGATIVE
Glucose, UA: NEGATIVE
Ketones, UA: NEGATIVE
Leukocytes,UA: NEGATIVE
Nitrite, UA: NEGATIVE
RBC, UA: NEGATIVE
Specific Gravity, UA: 1.02 (ref 1.005–1.030)
Urobilinogen, Ur: 0.2 mg/dL (ref 0.2–1.0)
pH, UA: 6 (ref 5.0–7.5)

## 2018-12-16 NOTE — Discharge Instructions (Addendum)
Drink plenty of fluids.  If you begin to feel dizzy or weak please return emergency department.  Continue all of your regular medications.

## 2018-12-16 NOTE — ED Triage Notes (Signed)
Per patient's report, patient went to the Cancer center for a cystoscopy for bladder cancer and was hypotensive upon arrival. Patient was given a cup of water, apple juice and peanut butter crackers by staff. Patient states he had to fast for labs today, but did drink water with his meds. Patient denies feeling dizzy or weak. Patient's b/p is 119/69 at this time.

## 2018-12-16 NOTE — ED Provider Notes (Signed)
[facility] Emergency Department Provider Note  ____________________________________________   First MD Initiated Contact with Patient 12/16/18 1422     (approximate)  I have reviewed the triage vital signs and the nursing notes.   HISTORY  Chief Complaint Hypotension    HPI Michael Holloway is a 85 y.o. male presents emergency department from the cancer center.  You had fasted for labs this morning and became hypotensive.  They proceeded to give him water, apple juice and peanut butter crackers.  Then sent him to the ED for evaluation.  However here in the ED his blood pressure has been 119/69, and 139/75 at 45-minute intervals.  He denies any dizziness, chest pain, shortness of breath, or weakness.    Past Medical History:  Diagnosis Date  . Arthritis   . Cancer ([place])    Basal Cell Skin Cancer  . Chronic kidney disease    stage 111  . Dysrhythmia   . GERD (gastroesophageal reflux disease)   . Gout   . History of kidney stones   . Hypertension   . Neuropathy     Patient Active Problem List   Diagnosis Date Noted  . Sick sinus syndrome ([place]) 08/03/2018  . Hyperlipidemia 06/10/2017  . Status post placement of implantable loop recorder 06/10/2017  . Bladder tumor 03/31/2017  . Carotid stenosis 01/07/2017  . Chronic insomnia 10/05/2016  . Gross hematuria 07/23/2016  . CKD (chronic kidney disease) stage 3, GFR 30-59 ml/min (HCC) 07/23/2016  . Lumbar radiculitis 06/10/2016  . Near syncope 04/24/2016  . Bigeminal rhythm 04/24/2016  . Paresthesia of bilateral legs 03/07/2016  . Chronic lumbar radiculopathy 03/07/2016  . Essential hypertension 09/03/2015  . Age-related macular degeneration, dry, right eye 06/07/2015  . Exudative age-related macular degeneration, left eye, with active choroidal neovascularization ([place]) 04/02/2015  . Degenerative disc disease, cervical 03/03/2015  . 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 Other allergic rhinitis  09/28/2014  . History of recent fall 09/28/2014  . Gastroesophageal reflux disease without esophagitis 09/28/2014  . Chronic neck pain 09/28/2014  . Benign essential hypertension 09/28/2014    Past Surgical History:  Procedure Laterality Date  . BACK SURGERY    . CERVICAL SPINE SURGERY    . CHOLECYSTECTOMY    . CYSTOSCOPY W/ RETROGRADES Bilateral 08/25/2016   Procedure: CYSTOSCOPY WITH RETROGRADE PYELOGRAM;  Surgeon: Hollice Espy, MD;  Location: [facility] OR[facility];  Service: Urology;  Laterality: Bilateral;  . EYE SURGERY Bilateral    Cataract Extraction with IOL  . LOOP RECORDER INSERTION N/A 06/03/2017   Procedure: LOOP RECORDER INSERTION;  Surgeon: Isaias Cowman, MD;  Location: [place] [facility];  Service: Cardiovascular;  Laterality: N/A;  . LUMBAR LAMINECTOMY    . PACEMAKER INSERTION Left 08/03/2018   Procedure: INSERTION PACEMAKER DUALCHAMBER;  Surgeon: Isaias Cowman, MD;  Location: [facility] ORS;  Service: Cardiovascular;  Laterality: Left;  . PROSTATE SURGERY  1990s   Dr. Eliberto Ivory ,in office procedure  . TONSILLECTOMY    . TRANSURETHRAL RESECTION OF BLADDER TUMOR WITH MITOMYCIN-C N/A 08/25/2016   Procedure: TRANSURETHRAL RESECTION OF BLADDER TUMOR WITH MITOMYCIN-C;  Surgeon: Hollice Espy, MD;  Location: [facility] OR[facility];  Service: Urology;  Laterality: N/A;  . TRANSURETHRAL RESECTION OF PROSTATE N/A 06/14/2018   Procedure: TRANSURETHRAL RESECTION OF THE PROSTATE (TURP) with Gemcitabine;  Surgeon: Hollice Espy, MD;  Location: [facility];  Service: Urology;  Laterality: N/A;    Prior to Admission medications   Medication Sig Start Date End Date Taking? Authorizing Provider  allopurinol (ZYLOPRIM) 100  MG tablet Take 100 mg by mouth daily.    [provider]  amLODipine (NORVASC) 10 MG tablet Take 5 mg by mouth at bedtime.  07/14/16   [provider]  aspirin EC 81 MG tablet Take 81 mg by mouth daily.    [provider]  Biotin 10 MG CAPS Take 10 mg by mouth  daily.     [provider]  diphenhydrAMINE (BENADRYL) 25 MG tablet Take 25 mg by mouth daily as needed for allergies.     [provider]  gabapentin (NEURONTIN) 600 MG tablet Take 600 mg by mouth 3 (three) times daily.  07/22/16   [provider]  losartan (COZAAR) 50 MG tablet Take 50 mg by mouth at bedtime.  06/30/16   [provider]  Multiple Vitamins-Minerals (CENTRUM ADULTS PO) Take 1 tablet by mouth daily.     [provider]  Multiple Vitamins-Minerals (PRESERVISION AREDS 2 PO) Take 1 capsule by mouth 2 (two) times daily.     [provider]  omeprazole (PRILOSEC) 20 MG capsule Take 20 mg by mouth daily.  05/19/16   [provider]  traMADol (ULTRAM) 50 MG tablet Take 50 mg by mouth 3 (three) times daily as needed for moderate pain.  06/16/16   [provider]    Allergies Sulfa antibiotics  Family History  Problem Relation Age of Onset  . Bladder Cancer Neg Hx   . Kidney cancer Neg Hx   . Prostate cancer Neg Hx     Social History Social History   Tobacco Use  . Smoking status: Former Smoker    Packs/day: 0.50    Types: Cigarettes    Quit date: 08/11/1978    Years since quitting: 40.3  . Smokeless tobacco: Never Used  Substance Use Topics  . Alcohol use: Not Currently    Comment: wine daily  . Drug use: No    Review of Systems  Constitutional: No fever/chills, concerns of hypotension Eyes: No visual changes. ENT: No sore throat. Respiratory: Denies cough Genitourinary: Negative for dysuria. Musculoskeletal: Negative for back pain. Skin: Negative for rash.    ____________________________________________   PHYSICAL EXAM:  VITAL SIGNS: ED Triage Vitals  Enc Vitals Group     BP 12/16/18 1244 119/69     Pulse Rate 12/16/18 1244 63     Resp 12/16/18 1244 18     Temp 12/16/18 1244 97.8 F (36.6 C)     Temp Source 12/16/18 1244 Oral     SpO2 12/16/18 1244 100 %     Weight 12/16/18 1250 220  lb (99.8 kg)     Height 12/16/18 1250 6\' 4"  (1.93 m)     Head Circumference --      Peak Flow --      Pain Score 12/16/18 1249 0     Pain Loc --      Pain Edu? --      Excl. in [place]? --     Constitutional: Alert and oriented. Well appearing and in no acute distress. Eyes: Conjunctivae are normal.  Head: Atraumatic. Nose: No congestion/rhinnorhea. Mouth/Throat: Mucous membranes are moist.   Neck:  supple no lymphadenopathy noted Cardiovascular: Normal rate, regular rhythm.  Respiratory: Normal respiratory effort.  No retractions,  Abd: soft nontender bs normal all 4 quad, no pulsatile mass noted GU: deferred Musculoskeletal: FROM all extremities, warm and well perfused Neurologic:  Normal speech and language.  Skin:  Skin is warm, dry and intact. No rash noted.  Psychiatric: Mood and affect are normal. Speech and behavior are normal.  ____________________________________________   LABS (all labs ordered are listed, but only abnormal results are displayed)  Labs Reviewed - No data to display ____________________________________________   ____________________________________________  RADIOLOGY    ____________________________________________   PROCEDURES  Procedure(s) performed: No  Procedures    ____________________________________________   INITIAL IMPRESSION / ASSESSMENT AND PLAN / ED COURSE  Pertinent labs & imaging results that were available during my care of the patient were reviewed by me and considered in my medical decision making (see chart for details).   Patient is 85 year old male presents emergency department from cancer center.  He had fasted for labs this morning and became hypotensive while they are in his cancer center.  They gave him water, apple juice, and peanut butter crackers and the patient states he is feeling fine.  He has had no dizziness or weakness.  Blood pressure here in the ED was 119/69 on arrival, 45 minutes later his blood  pressure is 139/75.  Patient appears well.  He does not appear to be weak.  Discussed the hypotension with the patient and his son.  He is to drink plenty of fluids.  Eat regular diet.  Follow-up cancer center as instructed.  He is to return if he becomes weak or dizzy.  He states he understands will comply.  Is discharged stable condition.    Michael Holloway was evaluated in Emergency Department on 12/16/2018 for the symptoms described in the history of present illness. He was evaluated in the context of the global COVID-19 pandemic, which necessitated consideration that the patient might be at risk for infection with the SARS-CoV-2 virus that causes COVID-19. Institutional protocols and algorithms that pertain to the evaluation of patients at risk for COVID-19 are in a state of rapid change based on information released by regulatory bodies including the CDC and federal and state organizations. These policies and algorithms were followed during the patient's care in the ED.   As part of my medical decision making, I reviewed the following data within the [place] History obtained from family, Nursing notes reviewed and incorporated, Old chart reviewed, Notes from prior ED visits and [place] Controlled Substance Database  ____________________________________________   FINAL CLINICAL IMPRESSION(S) / ED DIAGNOSES  Final diagnoses:  Hypotension due to hypovolemia      NEW MEDICATIONS STARTED DURING THIS VISIT:  New Prescriptions   No medications on file     Note:  This document was prepared using Dragon voice recognition software and may include unintentional dictation errors.    Versie Starks, PA-C 12/16/18 1433    Arta Silence, MD 12/16/18 2024

## 2018-12-16 NOTE — Progress Notes (Signed)
Patient presented today for cystoscopy, fulguration of bladder tumors.  However, his blood pressure was in the 70s over 30s and he felt somewhat weak.  His heart rate was anywhere from the 70s to 90s.  He was given oral fluids and a snack.  We did call his cardiologist felt that he need to be seen and evaluated in the emergency room.  As such, he was advised to present to the ER.  He was given the opportunity to reschedule his cystoscopy versus proceed.  He elected to reschedule which is quite reasonable.  We will have him return in a few weeks for this.  Hollice Espy, MD\

## 2018-12-23 DIAGNOSIS — M48062 Spinal stenosis, lumbar region with neurogenic claudication: Secondary | ICD-10-CM | POA: Insufficient documentation

## 2018-12-23 DIAGNOSIS — Z9989 Dependence on other enabling machines and devices: Secondary | ICD-10-CM | POA: Insufficient documentation

## 2018-12-23 DIAGNOSIS — R7309 Other abnormal glucose: Secondary | ICD-10-CM | POA: Insufficient documentation

## 2018-12-30 ENCOUNTER — Other Ambulatory Visit: Payer: Medicare Other | Admitting: Urology

## 2019-01-05 ENCOUNTER — Other Ambulatory Visit: Payer: Medicare Other | Admitting: Urology

## 2019-01-06 ENCOUNTER — Telehealth: Payer: Self-pay | Admitting: Urology

## 2019-01-06 NOTE — Telephone Encounter (Signed)
Pt called stating that appt for 9/10 doesn't work for him, he has an appt at [facility] that morning.  Please advise pt.

## 2019-01-10 ENCOUNTER — Other Ambulatory Visit: Payer: Self-pay

## 2019-01-10 ENCOUNTER — Ambulatory Visit (INDEPENDENT_AMBULATORY_CARE_PROVIDER_SITE_OTHER): Payer: Medicare Other

## 2019-01-10 ENCOUNTER — Ambulatory Visit (INDEPENDENT_AMBULATORY_CARE_PROVIDER_SITE_OTHER): Payer: Medicare Other | Admitting: Nurse Practitioner

## 2019-01-10 ENCOUNTER — Encounter (INDEPENDENT_AMBULATORY_CARE_PROVIDER_SITE_OTHER): Payer: Self-pay | Admitting: Nurse Practitioner

## 2019-01-10 VITALS — BP 111/60 | HR 80 | Resp 12 | Ht 76.0 in | Wt 215.0 lb

## 2019-01-10 DIAGNOSIS — I10 Essential (primary) hypertension: Secondary | ICD-10-CM | POA: Diagnosis not present

## 2019-01-10 DIAGNOSIS — I6523 Occlusion and stenosis of bilateral carotid arteries: Secondary | ICD-10-CM | POA: Diagnosis not present

## 2019-01-10 DIAGNOSIS — E782 Mixed hyperlipidemia: Secondary | ICD-10-CM | POA: Diagnosis not present

## 2019-01-10 NOTE — Progress Notes (Signed)
SUBJECTIVE:  Patient ID: Michael Holloway, male    DOB: 05-Jul-1928, 85 y.o.   MRN: [id number] Chief Complaint  Patient presents with  . Follow-up    HPI  Michael Holloway is a 85 y.o. male The patient is seen for follow up evaluation of carotid stenosis. The carotid stenosis followed by ultrasound.   The patient denies amaurosis fugax. There is no recent history of TIA symptoms or focal motor deficits. There is no prior documented CVA.  The patient is taking enteric-coated aspirin 81 mg daily.  There is no history of migraine headaches. There is no history of seizures.  The patient has a history of coronary artery disease, no recent episodes of angina or shortness of breath. The patient denies PAD or claudication symptoms. There is a history of hyperlipidemia which is being treated with a statin.    Carotid Duplex done today shows 1-39%.  Previous study done on 01/06/2018 had left internal carotid artery stenosis of 40 to 59%.  Past Medical History:  Diagnosis Date  . Arthritis   . Cancer ([place])    Basal Cell Skin Cancer  . Chronic kidney disease    stage 111  . Dysrhythmia   . GERD (gastroesophageal reflux disease)   . Gout   . History of kidney stones   . Hypertension   . Neuropathy     Past Surgical History:  Procedure Laterality Date  . BACK SURGERY    . CERVICAL SPINE SURGERY    . CHOLECYSTECTOMY    . CYSTOSCOPY W/ RETROGRADES Bilateral 08/25/2016   Procedure: CYSTOSCOPY WITH RETROGRADE PYELOGRAM;  Surgeon: Hollice Espy, MD;  Location: [facility];  Service: Urology;  Laterality: Bilateral;  . EYE SURGERY Bilateral    Cataract Extraction with IOL  . LOOP RECORDER INSERTION N/A 06/03/2017   Procedure: LOOP RECORDER INSERTION;  Surgeon: Isaias Cowman, MD;  Location: [place] [facility];  Service: Cardiovascular;  Laterality: N/A;  . LUMBAR LAMINECTOMY    . PACEMAKER INSERTION Left 08/03/2018   Procedure: INSERTION PACEMAKER DUALCHAMBER;  Surgeon: Isaias Cowman, MD;  Location: [facility];  Service: Cardiovascular;  Laterality: Left;  . PROSTATE SURGERY  1990s   Dr. Eliberto Ivory ,in office procedure  . TONSILLECTOMY    . TRANSURETHRAL RESECTION OF BLADDER TUMOR WITH MITOMYCIN-C N/A 08/25/2016   Procedure: TRANSURETHRAL RESECTION OF BLADDER TUMOR WITH MITOMYCIN-C;  Surgeon: Hollice Espy, MD;  Location: [facility];  Service: Urology;  Laterality: N/A;  . TRANSURETHRAL RESECTION OF PROSTATE N/A 06/14/2018   Procedure: TRANSURETHRAL RESECTION OF THE PROSTATE (TURP) with Gemcitabine;  Surgeon: Hollice Espy, MD;  Location: [facility];  Service: Urology;  Laterality: N/A;    Social History   Socioeconomic History  . Marital status: Widowed    Spouse name: Not on file  . Number of children: Not on file  . Years of education: Not on file  . Highest education level: Not on file  Occupational History  . Not on file  Social Needs  . Financial resource strain: Not on file  . Food insecurity    Worry: Not on file    Inability: Not on file  . Transportation needs    Medical: Not on file    Non-medical: Not on file  Tobacco Use  . Smoking status: Former Smoker    Packs/day: 0.50    Types: Cigarettes    Quit date: 08/11/1978    Years since quitting: 40.4  . Smokeless tobacco: Never Used  Substance and Sexual Activity  .  Alcohol use: Not Currently    Comment: wine daily  . Drug use: No  . Sexual activity: Yes  Lifestyle  . Physical activity    Days per week: Not on file    Minutes per session: Not on file  . Stress: Not on file  Relationships  . Social Herbalist on phone: Not on file    Gets together: Not on file    Attends religious service: Not on file    Active member of club or organization: Not on file    Attends meetings of clubs or organizations: Not on file    Relationship status: Not on file  . Intimate partner violence    Fear of current or ex partner: Not on file    Emotionally abused: Not on file    Physically abused:  Not on file    Forced sexual activity: Not on file  Other Topics Concern  . Not on file  Social History Narrative  . Not on file    Family History  Problem Relation Age of Onset  . Bladder Cancer Neg Hx   . Kidney cancer Neg Hx   . Prostate cancer Neg Hx     Allergies  Allergen Reactions  . Sulfa Antibiotics Rash     Review of Systems   Review of Systems: Negative Unless Checked Constitutional: [] Weight loss  [] Fever  [] Chills Cardiac: [] Chest pain   []  Atrial Fibrillation  [] Palpitations   [] Shortness of breath when laying flat   [] Shortness of breath with exertion. [] Shortness of breath at rest Vascular:  [] Pain in legs with walking   [] Pain in legs with standing [] Pain in legs when laying flat   [] Claudication    [] Pain in feet when laying flat    [] History of DVT   [] Phlebitis   [] Swelling in legs   [] Varicose veins   [] Non-healing ulcers Pulmonary:   [] Uses home oxygen   [] Productive cough   [] Hemoptysis   [] Wheeze  [] COPD   [] Asthma Neurologic:  [] Dizziness   [] Seizures  [] Blackouts [] History of stroke   [] History of TIA  [] Aphasia   [] Temporary Blindness   [] Weakness or numbness in arm   [x] Weakness or numbness in leg Musculoskeletal:   [] Joint swelling   [] Joint pain   [] Low back pain  []  History of Knee Replacement [x] Arthritis [] back Surgeries  []  Spinal Stenosis    Hematologic:  [] Easy bruising  [] Easy bleeding   [] Hypercoagulable state   [] Anemic Gastrointestinal:  [] Diarrhea   [] Vomiting  [x] Gastroesophageal reflux/heartburn   [] Difficulty swallowing. [] Abdominal pain Genitourinary:  [] Chronic kidney disease   [] Difficult urination  [] Anuric   [] Blood in urine [] Frequent urination  [] Burning with urination   [] Hematuria Skin:  [] Rashes   [] Ulcers [] Wounds Psychological:  [] History of anxiety   []  History of major depression  []  Memory Difficulties      OBJECTIVE:   Physical Exam  BP 111/60 (BP Location: Right Arm, Patient Position: Sitting, Cuff Size: Large)    Pulse 80   Resp 12   Ht 6\' 4"  (1.93 m)   Wt 215 lb (97.5 kg)   BMI 26.17 kg/m   Gen: WD/WN, NAD Head: [place]/AT, No temporalis wasting.  Ear/Nose/Throat: Hearing grossly intact, nares w/o erythema or drainage Eyes: PER, EOMI, sclera nonicteric.  Neck: Supple, no masses.  No JVD.  Pulmonary:  Good air movement, no use of accessory muscles.  Cardiac: RRR Vascular:  Vessel Right Left  Radial Palpable Palpable   Gastrointestinal: soft,  non-distended. No guarding/no peritoneal signs.  Musculoskeletal: Uses cane for ambulation  No deformity or atrophy.  Neurologic: Pain and light touch intact in extremities.  Symmetrical.  Speech is fluent. Motor exam as listed above. Psychiatric: Judgment intact, Mood & affect appropriate for pt's clinical situation. Dermatologic: No Venous rashes. No Ulcers Noted.  No changes consistent with cellulitis. Lymph : No Cervical lymphadenopathy, no lichenification or skin changes of chronic lymphedema.       ASSESSMENT AND PLAN:  1. Bilateral carotid artery stenosis Recommend:  Given the patient's asymptomatic subcritical stenosis no further invasive testing or surgery at this time.  Duplex ultrasound shows 1-39% stenosis bilaterally.  Continue antiplatelet therapy as prescribed Continue management of CAD, HTN and Hyperlipidemia Healthy heart diet,  encouraged exercise at least 4 times per week Follow up in 12 months with duplex ultrasound and physical exam   2. Essential hypertension Continue antihypertensive medications as already ordered, these medications have been reviewed and there are no changes at this time.   3. Mixed hyperlipidemia Continue statin as ordered and reviewed, no changes at this time    Current Outpatient Medications on File Prior to Visit  Medication Sig Dispense Refill  . allopurinol (ZYLOPRIM) 100 MG tablet Take 100 mg by mouth daily.    Marland Kitchen amLODipine (NORVASC) 10 MG tablet Take 5 mg by mouth at bedtime.     Marland Kitchen aspirin  EC 81 MG tablet Take 81 mg by mouth daily.    . Biotin 10 MG CAPS Take 10 mg by mouth daily.     . diphenhydrAMINE (BENADRYL) 25 MG tablet Take 25 mg by mouth daily as needed for allergies.     Marland Kitchen gabapentin (NEURONTIN) 600 MG tablet Take 600 mg by mouth 3 (three) times daily.     Marland Kitchen losartan (COZAAR) 50 MG tablet Take 50 mg by mouth at bedtime.     . Multiple Vitamins-Minerals (CENTRUM ADULTS PO) Take 1 tablet by mouth daily.     . Multiple Vitamins-Minerals (PRESERVISION AREDS 2 PO) Take 1 capsule by mouth 2 (two) times daily.     Marland Kitchen omeprazole (PRILOSEC) 20 MG capsule Take 20 mg by mouth daily.     . traMADol (ULTRAM) 50 MG tablet Take 50 mg by mouth 3 (three) times daily as needed for moderate pain.      Current Facility-Administered Medications on File Prior to Visit  Medication Dose Route Frequency Provider Last Rate Last Dose  . gemcitabine (GEMZAR) 2,000 mg in sodium chloride irrigation 0.9 % chemo infusion  2,000 mg Irrigation Once Hollice Espy, MD      . lidocaine (XYLOCAINE) 2 % jelly 1 application  1 application Urethral Once Hollice Espy, MD        There are no Patient Instructions on file for this visit. No follow-ups on file.   Kris Hartmann, NP  This note was completed with Sales executive.  Any errors are purely unintentional.

## 2019-01-10 NOTE — Telephone Encounter (Signed)
Patient notified and will go to [facility]  Instructions given  Sharyn Lull

## 2019-01-20 ENCOUNTER — Other Ambulatory Visit: Payer: Self-pay | Admitting: Urology

## 2019-01-25 ENCOUNTER — Other Ambulatory Visit: Payer: Self-pay

## 2019-01-25 DIAGNOSIS — Z8551 Personal history of malignant neoplasm of bladder: Secondary | ICD-10-CM

## 2019-01-28 ENCOUNTER — Encounter: Payer: Self-pay | Admitting: Urology

## 2019-01-28 ENCOUNTER — Other Ambulatory Visit: Payer: Self-pay

## 2019-01-28 ENCOUNTER — Ambulatory Visit (INDEPENDENT_AMBULATORY_CARE_PROVIDER_SITE_OTHER): Payer: Medicare Other | Admitting: Urology

## 2019-01-28 ENCOUNTER — Other Ambulatory Visit
Admission: RE | Admit: 2019-01-28 | Discharge: 2019-01-28 | Disposition: A | Discharge status: Home / Self Care | Payer: Medicare Other | Attending: Urology | Admitting: Urology

## 2019-01-28 VITALS — BP 116/67 | HR 78 | Ht 76.0 in | Wt 215.0 lb

## 2019-01-28 DIAGNOSIS — Z8551 Personal history of malignant neoplasm of bladder: Secondary | ICD-10-CM

## 2019-01-28 LAB — URINALYSIS, COMPLETE (UACMP) WITH MICROSCOPIC
Bilirubin Urine: NEGATIVE
Glucose, UA: NEGATIVE mg/dL
Hgb urine dipstick: NEGATIVE
Ketones, ur: NEGATIVE mg/dL
Leukocytes,Ua: NEGATIVE
Nitrite: NEGATIVE
Protein, ur: 30 mg/dL — AB
Specific Gravity, Urine: 1.015 (ref 1.005–1.030)
Squamous Epithelial / HPF: NONE SEEN (ref 0–5)
pH: 5.5 (ref 5.0–8.0)

## 2019-01-28 MED ORDER — LIDOCAINE HCL 2 % IJ SOLN: 50.0000 mL | Freq: Once | INTRAMUSCULAR | Status: DC

## 2019-01-28 NOTE — Progress Notes (Signed)
Bladder Instillation   Due to Bladder Cancer patient is present today for a Bladder Fulguration.  Patient was cleaned and prepped in a sterile fashion with betadine and lidocaine 2% jelly was instilled into the urethra.  A 14FR catheter was inserted, urine return was noted 1ml, urine was yellow in color.  8ml of Lidocaine 2% was instilled into the bladder. Cath removed and patient tolerated well. Patient held Lidocaine in bladder for 30 min prior to procedure  Preformed by: Fonnie Jarvis, CMA

## 2019-01-28 NOTE — Patient Instructions (Signed)

## 2019-01-28 NOTE — Progress Notes (Signed)
   01/28/2019  CC: cysto  HPI: 85 y.o. 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  In 03/2017, he was noted to have small recurrences most consistent with low-grade noninvasive lesions.  He is now undergone 3 office procedures for an office fulguration for low-grade superficial appearing tumors including on 03/31/2017, 05/2016, and 11/2017.  Today for routine surveillance cystoscopy.  Most recent upper tract imaging in the form of CT abdomen and pelvis without contrast on 3/18 which was unremarkable. Bilateral retrograde pyelogram in the operating room of 08/2016 also negative.  Most recently, he returned to the operating room on 06/2018 for repeat TURBT for small tumors involving the prostatic urethra and bladder neck, small.  Tumors were primarily low-grade with some high-grade features, superficial.  Muscularis propria was present but not involved.  Intravesical gemcitabine was instilled.  Postoperatively, the plan was to undergo induction course of BCG, however in light of COVID-19 global pandemic, these have been deferred in light of PPE issues.  In July 2020, he was found to have some small recurrences at the dome.  He elected for repeat office fulguration for which he finally presents today.    He does smoking history, 3/4 ppd x 20 years. Quit in 1980.    Blood pressure 116/67, pulse 78, height 6\' 4"  (1.93 m), weight 215 lb (97.5 kg). NED. A&Ox3.  No respiratory distress  Abd soft, NT, ND Normal phallus with bilateral descended testicles  Cystoscopy Procedure Note  Patient identification was confirmed, informed consent was obtained, and patient was prepped using Betadine solution.   Preoperative abx where received prior to procedure.   Pre-Procedure: - Inspection reveals a normal caliber ureteral meatus.  Procedure: The flexible cystoscope was introduced without difficulty - Enlargedprostate trilobar coaptation, small  adherent necrotic debris just proximal to the verumontanum at the site of previous resection but no obvious recurrence within the prostatic fossa - Mildly elevatedbladder neck - Bilateral ureteral orifices identified - Bladder mucosareveals multiple small papillary lesions on the dome of the bladder at site of previous recurrences, approximately 3 or 4 as well as one small lesion on the left dome, low-grade appearing measuring approximately 0.5 cm in conglomerate - Mild trabeculation  Bugbee electrocautery was then used to fulgurate the dome of the bladder using electrocautery settings of 30.  This was well-tolerated.  All viable and visible tumor was obliterated.  Post-Procedure: - Patient tolerated the procedure well  Assessment/ Plan:  1. 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  Risk and benefits were previously discussed.  Like to proceed as planned.  We will wait for about 4 weeks following this fulguration for an induction course of BCG followed by cystoscopy in 3 months.  He is agreeable this plan. - Urinalysis, Complete  Hollice Espy, MD

## 2019-03-04 ENCOUNTER — Other Ambulatory Visit: Payer: Self-pay

## 2019-03-04 ENCOUNTER — Ambulatory Visit (INDEPENDENT_AMBULATORY_CARE_PROVIDER_SITE_OTHER): Payer: Medicare Other | Admitting: Physician Assistant

## 2019-03-04 ENCOUNTER — Ambulatory Visit: Payer: BLUE CROSS/BLUE SHIELD

## 2019-03-04 ENCOUNTER — Ambulatory Visit: Payer: Self-pay | Admitting: Physician Assistant

## 2019-03-04 ENCOUNTER — Encounter: Payer: Self-pay | Admitting: Physician Assistant

## 2019-03-04 VITALS — BP 97/61 | HR 83 | Ht 76.0 in | Wt 218.0 lb

## 2019-03-04 DIAGNOSIS — D4959 Neoplasm of unspecified behavior of other genitourinary organ: Secondary | ICD-10-CM

## 2019-03-04 LAB — MICROSCOPIC EXAMINATION: Bacteria, UA: NONE SEEN

## 2019-03-04 LAB — URINALYSIS, COMPLETE
Bilirubin, UA: NEGATIVE
Glucose, UA: NEGATIVE
Ketones, UA: NEGATIVE
Nitrite, UA: NEGATIVE
Specific Gravity, UA: 1.015 (ref 1.005–1.030)
Urobilinogen, Ur: 0.2 mg/dL (ref 0.2–1.0)
pH, UA: 5.5 (ref 5.0–7.5)

## 2019-03-04 MED ORDER — BCG LIVE 50 MG IS SUSR
3.2400 mL | Freq: Once | INTRAVESICAL | Status: AC
  Administered 2019-03-04: 81 mg via INTRAVESICAL

## 2019-03-04 NOTE — Progress Notes (Signed)
BCG Bladder Instillation  BCG # 1  Due to Bladder Cancer patient is present today for a BCG treatment. Patient was cleaned and prepped in a sterile fashion with betadine and lidocaine 2% jelly was instilled into the urethra.  A 14FR catheter was inserted, urine return was noted 42ml, urine was clear yellow in color.  78ml of reconstituted BCG was instilled into the bladder. The catheter was then removed. Patient tolerated well, no complications were noted  Preformed by: Debroah Loop, PA-C Assisted by: Verlene Mayer, CMA  Follow up/ Additional notes: 1 week for BCG #2

## 2019-03-04 NOTE — Patient Instructions (Signed)

## 2019-03-11 ENCOUNTER — Other Ambulatory Visit: Payer: Self-pay

## 2019-03-11 ENCOUNTER — Ambulatory Visit (INDEPENDENT_AMBULATORY_CARE_PROVIDER_SITE_OTHER): Payer: Medicare Other | Admitting: Physician Assistant

## 2019-03-11 ENCOUNTER — Encounter: Payer: Self-pay | Admitting: Physician Assistant

## 2019-03-11 ENCOUNTER — Ambulatory Visit: Payer: Self-pay | Admitting: Physician Assistant

## 2019-03-11 VITALS — BP 112/62 | HR 88 | Ht 76.0 in | Wt 218.0 lb

## 2019-03-11 DIAGNOSIS — C671 Malignant neoplasm of dome of bladder: Secondary | ICD-10-CM

## 2019-03-11 LAB — MICROSCOPIC EXAMINATION: Bacteria, UA: NONE SEEN

## 2019-03-11 LAB — URINALYSIS, COMPLETE
Bilirubin, UA: NEGATIVE
Glucose, UA: NEGATIVE
Ketones, UA: NEGATIVE
Leukocytes,UA: NEGATIVE
Nitrite, UA: NEGATIVE
Specific Gravity, UA: 1.02 (ref 1.005–1.030)
Urobilinogen, Ur: 0.2 mg/dL (ref 0.2–1.0)
pH, UA: 5.5 (ref 5.0–7.5)

## 2019-03-11 MED ORDER — BCG LIVE 50 MG IS SUSR
3.2400 mL | Freq: Once | INTRAVESICAL | Status: AC
  Administered 2019-03-11: 81 mg via INTRAVESICAL

## 2019-03-11 NOTE — Patient Instructions (Signed)

## 2019-03-11 NOTE — Progress Notes (Signed)
BCG Bladder Instillation  BCG # 2 of 6  Due to Bladder Cancer patient is present today for a BCG treatment. Patient was cleaned and prepped in a sterile fashion with betadine and lidocaine 2% jelly was instilled into the urethra.  A 14FR catheter was inserted, urine return was noted 36ml.  44ml of reconstituted BCG was instilled into the bladder. The catheter was then removed. Patient tolerated well, no complications were noted  Performed by: Debroah Loop, PA-C and Gaspar Cola, CMA  Follow up/ Additional notes: 1 week for BCG 3 of 6

## 2019-03-17 NOTE — Progress Notes (Signed)
BCG Bladder Instillation  BCG # 3 of 6  Due to Bladder Cancer patient is present today for a BCG treatment. Patient was cleaned and prepped in a sterile fashion with betadine and lidocaine 2% jelly was instilled into the urethra.  A 14 FR catheter was inserted, urine return was noted 50 ml, urine was yellow in color.  84ml of reconstituted BCG was instilled into the bladder. The catheter was then removed. Patient tolerated well, no complications were noted  Performed by: Zara Council, PA-C

## 2019-03-18 ENCOUNTER — Ambulatory Visit (INDEPENDENT_AMBULATORY_CARE_PROVIDER_SITE_OTHER): Payer: Medicare Other | Admitting: Urology

## 2019-03-18 ENCOUNTER — Other Ambulatory Visit: Payer: Self-pay

## 2019-03-18 ENCOUNTER — Encounter: Payer: Self-pay | Admitting: Urology

## 2019-03-18 ENCOUNTER — Ambulatory Visit: Payer: Self-pay | Admitting: Physician Assistant

## 2019-03-18 VITALS — BP 143/66 | HR 89 | Ht 72.0 in | Wt 218.0 lb

## 2019-03-18 DIAGNOSIS — C671 Malignant neoplasm of dome of bladder: Secondary | ICD-10-CM | POA: Diagnosis not present

## 2019-03-18 LAB — MICROSCOPIC EXAMINATION

## 2019-03-18 LAB — URINALYSIS, COMPLETE
Bilirubin, UA: NEGATIVE
Glucose, UA: NEGATIVE
Leukocytes,UA: NEGATIVE
Nitrite, UA: NEGATIVE
Specific Gravity, UA: 1.025 (ref 1.005–1.030)
Urobilinogen, Ur: 0.2 mg/dL (ref 0.2–1.0)
pH, UA: 6 (ref 5.0–7.5)

## 2019-03-18 MED ORDER — BCG LIVE 50 MG IS SUSR
3.2400 mL | Freq: Once | INTRAVESICAL | Status: AC
  Administered 2019-03-18: 81 mg via INTRAVESICAL

## 2019-03-18 NOTE — Patient Instructions (Signed)

## 2019-03-25 ENCOUNTER — Ambulatory Visit (INDEPENDENT_AMBULATORY_CARE_PROVIDER_SITE_OTHER): Payer: Medicare Other | Admitting: Physician Assistant

## 2019-03-25 ENCOUNTER — Ambulatory Visit: Payer: Self-pay | Admitting: Physician Assistant

## 2019-03-25 ENCOUNTER — Encounter: Payer: Self-pay | Admitting: Physician Assistant

## 2019-03-25 ENCOUNTER — Other Ambulatory Visit: Payer: Self-pay

## 2019-03-25 VITALS — BP 95/55 | HR 97 | Ht 76.0 in | Wt 220.0 lb

## 2019-03-25 DIAGNOSIS — C671 Malignant neoplasm of dome of bladder: Secondary | ICD-10-CM

## 2019-03-25 LAB — MICROSCOPIC EXAMINATION
Bacteria, UA: NONE SEEN
RBC, Urine: NONE SEEN /hpf (ref 0–2)

## 2019-03-25 LAB — URINALYSIS, COMPLETE
Bilirubin, UA: NEGATIVE
Glucose, UA: NEGATIVE
Ketones, UA: NEGATIVE
Leukocytes,UA: NEGATIVE
Nitrite, UA: NEGATIVE
RBC, UA: NEGATIVE
Specific Gravity, UA: 1.02 (ref 1.005–1.030)
Urobilinogen, Ur: 0.2 mg/dL (ref 0.2–1.0)
pH, UA: 6 (ref 5.0–7.5)

## 2019-03-25 MED ORDER — BCG LIVE 50 MG IS SUSR
3.2400 mL | Freq: Once | INTRAVESICAL | Status: AC
  Administered 2019-03-25: 81 mg via INTRAVESICAL

## 2019-03-25 NOTE — Progress Notes (Signed)
BCG Bladder Instillation  BCG # 4 of 6  Due to Bladder Cancer patient is present today for a BCG treatment. Patient was cleaned and prepped in a sterile fashion with betadine and lidocaine 2% jelly was instilled into the urethra.  A 14FR catheter was inserted, urine return was noted 56ml, urine was clear yellow in color.  7ml of reconstituted BCG was instilled into the bladder. The catheter was then removed. Patient tolerated well, no complications were noted  Performed by: Debroah Loop, PA-C and Gordy Clement, [place]  Follow up/ Additional notes: 1 week for BCG #5 of 6

## 2019-03-25 NOTE — Addendum Note (Signed)
Addended by: Donalee Citrin on: 03/25/2019 10:54 AM   Modules accepted: Orders

## 2019-04-01 ENCOUNTER — Ambulatory Visit: Payer: Self-pay | Admitting: Physician Assistant

## 2019-04-01 ENCOUNTER — Encounter: Payer: Self-pay | Admitting: Physician Assistant

## 2019-04-01 ENCOUNTER — Ambulatory Visit (INDEPENDENT_AMBULATORY_CARE_PROVIDER_SITE_OTHER): Payer: Medicare Other | Admitting: Physician Assistant

## 2019-04-01 ENCOUNTER — Other Ambulatory Visit: Payer: Self-pay

## 2019-04-01 VITALS — BP 121/63 | HR 81 | Ht 76.0 in | Wt 215.0 lb

## 2019-04-01 DIAGNOSIS — C671 Malignant neoplasm of dome of bladder: Secondary | ICD-10-CM

## 2019-04-01 LAB — MICROSCOPIC EXAMINATION
Bacteria, UA: NONE SEEN
RBC, Urine: NONE SEEN /hpf (ref 0–2)

## 2019-04-01 LAB — URINALYSIS, COMPLETE
Bilirubin, UA: NEGATIVE
Glucose, UA: NEGATIVE
Ketones, UA: NEGATIVE
Leukocytes,UA: NEGATIVE
Nitrite, UA: NEGATIVE
RBC, UA: NEGATIVE
Specific Gravity, UA: 1.02 (ref 1.005–1.030)
Urobilinogen, Ur: 0.2 mg/dL (ref 0.2–1.0)
pH, UA: 6 (ref 5.0–7.5)

## 2019-04-01 MED ORDER — BCG LIVE 50 MG IS SUSR
3.2400 mL | Freq: Once | INTRAVESICAL | Status: AC
  Administered 2019-04-01: 81 mg via INTRAVESICAL

## 2019-04-01 NOTE — Progress Notes (Signed)
BCG Bladder Instillation  BCG # 5 of 6  Due to Bladder Cancer patient is present today for a BCG treatment. Patient was cleaned and prepped in a sterile fashion with betadine and lidocaine 2% jelly was instilled into the urethra.  A 14FR catheter was inserted, urine return was noted <73ml, urine was yellow in color.  75ml of reconstituted BCG was instilled into the bladder. The catheter was then removed. Patient tolerated well, no complications were noted  Performed by: Debroah Loop, PA-C and Verlene Mayer, CMA  Follow up/ Additional notes: 2 weeks for BCG #6 of 6

## 2019-04-15 ENCOUNTER — Ambulatory Visit: Payer: Self-pay | Admitting: Physician Assistant

## 2019-04-15 ENCOUNTER — Ambulatory Visit (INDEPENDENT_AMBULATORY_CARE_PROVIDER_SITE_OTHER): Payer: Medicare Other | Admitting: Physician Assistant

## 2019-04-15 ENCOUNTER — Other Ambulatory Visit: Payer: Self-pay

## 2019-04-15 ENCOUNTER — Encounter: Payer: Self-pay | Admitting: Physician Assistant

## 2019-04-15 VITALS — BP 120/68 | HR 74 | Ht 76.0 in | Wt 215.0 lb

## 2019-04-15 DIAGNOSIS — C671 Malignant neoplasm of dome of bladder: Secondary | ICD-10-CM | POA: Diagnosis not present

## 2019-04-15 DIAGNOSIS — Z8551 Personal history of malignant neoplasm of bladder: Secondary | ICD-10-CM

## 2019-04-15 LAB — URINALYSIS, COMPLETE
Bilirubin, UA: NEGATIVE
Glucose, UA: NEGATIVE
Leukocytes,UA: NEGATIVE
Nitrite, UA: NEGATIVE
Protein,UA: NEGATIVE
RBC, UA: NEGATIVE
Specific Gravity, UA: 1.02 (ref 1.005–1.030)
Urobilinogen, Ur: 0.2 mg/dL (ref 0.2–1.0)
pH, UA: 6 (ref 5.0–7.5)

## 2019-04-15 LAB — MICROSCOPIC EXAMINATION
Bacteria, UA: NONE SEEN
RBC, Urine: NONE SEEN /hpf (ref 0–2)

## 2019-04-15 MED ORDER — BCG LIVE 50 MG IS SUSR
3.2400 mL | Freq: Once | INTRAVESICAL | Status: AC
  Administered 2019-04-15: 81 mg via INTRAVESICAL

## 2019-04-15 NOTE — Progress Notes (Signed)
BCG Bladder Instillation  BCG # 6 of 6  Due to Bladder Cancer patient is present today for a BCG treatment. Patient was cleaned and prepped in a sterile fashion with betadine and lidocaine 2% jelly was instilled into the urethra.  A 14FR catheter was inserted, urine return was noted 85ml, urine was pale yellow in color.  63ml of reconstituted BCG was instilled into the bladder. The catheter was then removed. Patient tolerated well, no complications were noted  Performed by: Debroah Loop, PA-C and Gaspar Cola, CMA  Follow up/ Additional notes: 06/28/2019 for cystoscopy with Dr. Erlene Quan

## 2019-06-28 ENCOUNTER — Other Ambulatory Visit: Payer: Medicare Other | Admitting: Urology

## 2019-06-28 DIAGNOSIS — Z0000 Encounter for general adult medical examination without abnormal findings: Secondary | ICD-10-CM | POA: Insufficient documentation

## 2019-06-29 ENCOUNTER — Ambulatory Visit (INDEPENDENT_AMBULATORY_CARE_PROVIDER_SITE_OTHER): Payer: Medicare Other | Admitting: Urology

## 2019-06-29 ENCOUNTER — Encounter: Payer: Self-pay | Admitting: Urology

## 2019-06-29 ENCOUNTER — Other Ambulatory Visit: Payer: Self-pay

## 2019-06-29 VITALS — BP 140/76 | HR 85 | Ht 76.0 in | Wt 215.0 lb

## 2019-06-29 DIAGNOSIS — Z8551 Personal history of malignant neoplasm of bladder: Secondary | ICD-10-CM

## 2019-06-29 LAB — MICROSCOPIC EXAMINATION: RBC, Urine: NONE SEEN /hpf (ref 0–2)

## 2019-06-29 LAB — URINALYSIS, COMPLETE
Bilirubin, UA: NEGATIVE
Glucose, UA: NEGATIVE
Ketones, UA: NEGATIVE
Leukocytes,UA: NEGATIVE
Nitrite, UA: NEGATIVE
RBC, UA: NEGATIVE
Specific Gravity, UA: 1.02 (ref 1.005–1.030)
Urobilinogen, Ur: 0.2 mg/dL (ref 0.2–1.0)
pH, UA: 6 (ref 5.0–7.5)

## 2019-06-29 NOTE — Progress Notes (Signed)
   06/29/2019  CC: cysto  HPI: 86 y.o. 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  In 03/2017, he was noted to have small recurrences most consistent with low-grade noninvasive lesions.  He is now undergone 3 office procedures for an office fulguration for low-grade superficial appearing tumors including on 03/31/2017, 05/2016, and 11/2017.  Today for routine surveillance cystoscopy.  Most recent upper tract imaging in the form of CT abdomen and pelvis without contrast on 3/18 which was unremarkable. Bilateral retrograde pyelogram in the operating room of 08/2016 also negative.  Most recently, he returned to the operating room on 06/2018 for repeat TURBT for small tumors involving the prostatic urethra and bladder neck, small.  Tumors were primarily low-grade with some high-grade features, superficial.  Muscularis propria was present but not involved.  Intravesical gemcitabine was instilled.  He additional low-grade appearing recurrence at the dome and underwent office fulguration of these 01/2019  Postoperatively, the plan was to undergo induction course of BCG, however in light of COVID-19 global pandemic, these have been deferred in light of PPE issues.  He since completed this about 3 months ago (05/2019).  He returns today for cystoscopy.   He does smoking history, 3/4 ppd x 20 years. Quit in 1980.    Blood pressure 140/76, pulse 85, height 6\' 4"  (1.93 m), weight 215 lb (97.5 kg). NED. A&Ox3.  No respiratory distress  Abd soft, NT, ND Normal phallus with bilateral descended testicles  Cystoscopy Procedure Note  Patient identification was confirmed, informed consent was obtained, and patient was prepped using Betadine solution.   Preoperative abx where received prior to procedure.   Pre-Procedure: - Inspection reveals a normal caliber ureteral meatus.  Procedure: The flexible cystoscope was introduced without difficulty -  Enlargedprostate trilobar coaptation and small TURP defect - Mildly elevatedbladder neck  -Bladder mucosa reveals some subtle erythema at the dome but no obvious tumor.  Small approximately 2 mm with adjacent satellite lesion, fine papillary carpeting on posterior bladder wall consistent with a small low-grade recurrence. - Bilateral ureteral orifices identified - Bladder mucosareveals  - Mild trabeculation   Post-Procedure: - Patient tolerated the procedure well  Assessment/ Plan:  1. Malignant neoplasm of posterior wall (HCC) Small low-grade appearing recurrence of posterior bladder wall  As per previous discussions, I offered to proceed to the operating room for TURBT versus in office fulguration.  Given his age, medical comorbidities, and previous tolerance of an office fulguration, he opted for this.  He understands we would not be able to get pathology results with this.  Risk and benefits discussed.  All questions answered.  Return next week for an office fulguration of bladder tumor.  - Urinalysis, Complete  Hollice Espy, MD

## 2019-07-05 ENCOUNTER — Other Ambulatory Visit: Payer: Self-pay | Admitting: Radiology

## 2019-07-05 ENCOUNTER — Other Ambulatory Visit: Payer: Self-pay

## 2019-07-05 ENCOUNTER — Encounter: Payer: Self-pay | Admitting: Urology

## 2019-07-05 ENCOUNTER — Ambulatory Visit (INDEPENDENT_AMBULATORY_CARE_PROVIDER_SITE_OTHER): Payer: Medicare Other | Admitting: Urology

## 2019-07-05 VITALS — BP 122/78 | HR 89 | Ht 76.0 in | Wt 215.0 lb

## 2019-07-05 DIAGNOSIS — Z8551 Personal history of malignant neoplasm of bladder: Secondary | ICD-10-CM

## 2019-07-05 DIAGNOSIS — C671 Malignant neoplasm of dome of bladder: Secondary | ICD-10-CM

## 2019-07-05 MED ORDER — GEMCITABINE CHEMO FOR BLADDER INSTILLATION 2000 MG: 2000.0000 mg | Freq: Once | INTRAVENOUS | Status: DC

## 2019-07-05 MED ORDER — LIDOCAINE HCL 2 % IJ SOLN
60.0000 mL | Freq: Once | INTRAMUSCULAR | Status: AC
  Administered 2019-07-05: 12:00:00 1200 mg

## 2019-07-05 NOTE — Progress Notes (Deleted)
   07/05/19  CC:  Chief Complaint  Patient presents with  . Cysto fulgeration    HPI:  Blood pressure 122/78, pulse 89, height 6\' 4"  (1.93 m), weight 215 lb (97.5 kg). NED. A&Ox3.   No respiratory distress   Abd soft, NT, ND Normal phallus with bilateral descended testicles  Cystoscopy Procedure Note  Patient identification was confirmed, informed consent was obtained, and patient was prepped using Betadine solution.  Lidocaine jelly was administered per urethral meatus.     Pre-Procedure: - Inspection reveals a normal caliber ureteral meatus.  Procedure: The flexible cystoscope was introduced without difficulty - No urethral strictures/lesions are present. - Normal prostate *** - {Blank multiple:19197::"Normal","Elevated","Tight"} bladder neck - Bilateral ureteral orifices identified - Bladder mucosa  reveals no ulcers, tumors, or lesions - No bladder stones - No trabeculation  Retroflexion shows ***   Post-Procedure: - Patient tolerated the procedure well  Assessment/ Plan:   No follow-ups on file.  Hollice Espy, MD

## 2019-07-05 NOTE — H&P (View-Only) (Signed)
   07/05/2019  CC: cysto  HPI: 86 y.o. 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    In 03/2017, he was noted to have small recurrences most consistent with low-grade noninvasive lesions.  He is now undergone 3 office procedures for an office fulguration for low-grade superficial appearing tumors including on 03/31/2017, 05/2016, and 11/2017.  Today for routine surveillance cystoscopy.  Most recent upper tract imaging in the form of CT abdomen and pelvis without contrast on 3/18 which was unremarkable. Bilateral retrograde pyelogram in the operating room of 08/2016 also negative.  Most recently, he returned to the operating room on 06/2018 for repeat TURBT for small tumors involving the prostatic urethra and bladder neck, small.  Tumors were primarily low-grade with some high-grade features, superficial.  Muscularis propria was present but not involved.  Intravesical gemcitabine was instilled.  He additional low-grade appearing recurrence at the dome and underwent office fulguration of these 01/2019  Postoperatively, the plan was to undergo induction course of BCG, however in light of COVID-19 global pandemic, these have been deferred in light of PPE issues.  He since completed this about 3 months ago (05/2019).   He does smoking history, 3/4 ppd x 20 years. Quit in 1980.    Blood pressure 122/78, pulse 89, height 6\' 4"  (1.93 m), weight 215 lb (97.5 kg). NED. A&Ox3.  No respiratory distress  Abd soft, NT, ND Normal phallus with bilateral descended testicles  Cystoscopy Procedure Note  Patient identification was confirmed, informed consent was obtained, and patient was prepped using Betadine solution.  Prior to this procedure, he did have lidocaine instilled to the bladder and allowed to dwell for 1 hour.  Please see CMA's notes for details.  Preoperative  abx where received prior to procedure.   Pre-Procedure: - Inspection reveals a normal caliber ureteral meatus.  Procedure: The flexible cystoscope was introduced without difficulty - Enlargedprostate trilobar coaptation and small TURP defect - Mildly elevatedbladder neck  -Bladder mucosa reveals some subtle erythema at the dome but no obvious tumor.  Small approximately 2 mm with adjacent satellite lesion, fine papillary carpeting on posterior bladder wall consistent with a small low-grade recurrence.  Bugbee electrocautery was then used to fulgurate this area which he was able to tolerate well.  Unfortunately, on inspecting the bladder following his procedure, a much larger area, at least 3 cm a very fine early papillary change was noted on the right posterior lateral bladder wall, not appreciated at the time of last cystoscopy.  This appeared to be too extensive for fulguration. - Bilateral ureteral orifices identified - Bladder mucosareveals  - Mild trabeculation   Post-Procedure: - Patient tolerated the procedure well  Assessment/ Plan:  1. Malignant neoplasm of posterior wall (HCC) Small low-grade appearing recurrence of posterior bladder wall- fulgerated  More extensive carpeting, approximately 3 cm identified on the right posterior lateral bladder wall requiring TURBT in the operating room  Risk and benefits discussed in detail including risk of bleeding, infection, demonstrating structures, bladder perforation amongst others.  We will plan on intravesical mitomycin and upper tract imaging in the form of bilateral retrogrades at the time.  He is agreeable this plan.  Preop urine culture sent today.  All questions answered. - Urinalysis, Complete  Hollice Espy, MD

## 2019-07-05 NOTE — Progress Notes (Signed)
Bladder Instillation  Due to Cysto fulgeration patient is present today for a Bladder Instillation of 66ml of 2% Lidocaine. Patient was cleaned and prepped in a sterile fashion with betadine and lidocaine 2% jelly was instilled into the urethra.  A 14FR catheter was inserted, urine return was noted 65ml, urine was yellow in color.  60 ml was instilled into the bladder. The catheter was then removed. Patient tolerated well, no complications were noted Patient held in bladder for 30 minutes prior to procedure starting.   Performed by: Verlene Mayer, [place]

## 2019-07-05 NOTE — Progress Notes (Signed)
   07/05/2019  CC: cysto  HPI: 86 y.o. 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    In 03/2017, he was noted to have small recurrences most consistent with low-grade noninvasive lesions.  He is now undergone 3 office procedures for an office fulguration for low-grade superficial appearing tumors including on 03/31/2017, 05/2016, and 11/2017.  Today for routine surveillance cystoscopy.  Most recent upper tract imaging in the form of CT abdomen and pelvis without contrast on 3/18 which was unremarkable. Bilateral retrograde pyelogram in the operating room of 08/2016 also negative.  Most recently, he returned to the operating room on 06/2018 for repeat TURBT for small tumors involving the prostatic urethra and bladder neck, small.  Tumors were primarily low-grade with some high-grade features, superficial.  Muscularis propria was present but not involved.  Intravesical gemcitabine was instilled.  He additional low-grade appearing recurrence at the dome and underwent office fulguration of these 01/2019  Postoperatively, the plan was to undergo induction course of BCG, however in light of COVID-19 global pandemic, these have been deferred in light of PPE issues.  He since completed this about 3 months ago (05/2019).   He does smoking history, 3/4 ppd x 20 years. Quit in 1980.    Blood pressure 122/78, pulse 89, height 6\' 4"  (1.93 m), weight 215 lb (97.5 kg). NED. A&Ox3.  No respiratory distress  Abd soft, NT, ND Normal phallus with bilateral descended testicles  Cystoscopy Procedure Note  Patient identification was confirmed, informed consent was obtained, and patient was prepped using Betadine solution.  Prior to this procedure, he did have lidocaine instilled to the bladder and allowed to dwell for 1 hour.  Please see CMA's notes for details.  Preoperative  abx where received prior to procedure.   Pre-Procedure: - Inspection reveals a normal caliber ureteral meatus.  Procedure: The flexible cystoscope was introduced without difficulty - Enlargedprostate trilobar coaptation and small TURP defect - Mildly elevatedbladder neck  -Bladder mucosa reveals some subtle erythema at the dome but no obvious tumor.  Small approximately 2 mm with adjacent satellite lesion, fine papillary carpeting on posterior bladder wall consistent with a small low-grade recurrence.  Bugbee electrocautery was then used to fulgurate this area which he was able to tolerate well.  Unfortunately, on inspecting the bladder following his procedure, a much larger area, at least 3 cm a very fine early papillary change was noted on the right posterior lateral bladder wall, not appreciated at the time of last cystoscopy.  This appeared to be too extensive for fulguration. - Bilateral ureteral orifices identified - Bladder mucosareveals  - Mild trabeculation   Post-Procedure: - Patient tolerated the procedure well  Assessment/ Plan:  1. Malignant neoplasm of posterior wall (HCC) Small low-grade appearing recurrence of posterior bladder wall- fulgerated  More extensive carpeting, approximately 3 cm identified on the right posterior lateral bladder wall requiring TURBT in the operating room  Risk and benefits discussed in detail including risk of bleeding, infection, demonstrating structures, bladder perforation amongst others.  We will plan on intravesical mitomycin and upper tract imaging in the form of bilateral retrogrades at the time.  He is agreeable this plan.  Preop urine culture sent today.  All questions answered. - Urinalysis, Complete  Hollice Espy, MD

## 2019-07-06 LAB — URINALYSIS, COMPLETE
Bilirubin, UA: NEGATIVE
Glucose, UA: NEGATIVE
Leukocytes,UA: NEGATIVE
Nitrite, UA: NEGATIVE
RBC, UA: NEGATIVE
Specific Gravity, UA: 1.02 (ref 1.005–1.030)
Urobilinogen, Ur: 0.2 mg/dL (ref 0.2–1.0)
pH, UA: 6 (ref 5.0–7.5)

## 2019-07-06 LAB — MICROSCOPIC EXAMINATION
Bacteria, UA: NONE SEEN
RBC, Urine: NONE SEEN /hpf (ref 0–2)

## 2019-07-08 LAB — CULTURE, URINE COMPREHENSIVE

## 2019-07-12 ENCOUNTER — Encounter
Admission: RE | Admit: 2019-07-12 | Discharge: 2019-07-12 | Disposition: A | Discharge status: Home / Self Care | Payer: Medicare Other | Source: Ambulatory Visit | Attending: Urology | Admitting: Urology

## 2019-07-12 HISTORY — DX: Spinal stenosis, site unspecified: M48.00

## 2019-07-12 NOTE — Pre-Procedure Instructions (Signed)
Progress Notes - documented in this encounter Isaias Cowman, MD - 03/14/2019 10:00 AM EST Formatting of this note might be different from the original. Established Patient Visit   Chief Complaint: Chief Complaint  Patient presents with  . 4 Month Follow-Up  Date of Service: 03/14/2019 Date of Birth: August 29, 1928 PCP: Harrold Donath, MD  History of Present Illness: Mr. Pollan is a 86 y.o.male patient who returns for  1. Recurrent presyncope 2. Sick sinus syndrome/sinus pauses 3. Essential hypertension 4. Status post Linq 06/03/2017 5. Status post dual-chamber pacemaker 08/03/2018 6. Peripheral neuropathy  The patient has a history of peripheral neuropathy with extensive degenerative disc disease status post multiple back surgeries. He has had a 1-year history of recurrent episodes of lightheadedness, fatigue and postural dizziness. The patient had a presyncopal episode while singing in the choir in church. Patient also had a presyncopal episode, fell to the floor in his kitchen, without complete loss of consciousness. He reports 2 episodes of lightheadedness per month. He saw his primary care provider; ECG revealed sinus rhythm, but computerized reading was atrial fibrillation, and the patient was sent to [facility] ED, where he was noted to be in sinus rhythm. Previous 24-hour Holter monitor on 04/10/2016 revealed normal sinus rhythm, with frequent PVCs and PACs, with one 7 beat atrial run.  The patient was seen 07/07/2018, reported more frequent episodes of lightheadedness, with near syncope, especially in the morning, associated with weakness. Patient also reported experiencing chest heaviness with exercise. Linq downloads 11/11/2017 to 01/04/2018, 01/18/2018 to 01/30/2018, 06/02/2020 - 2/21 2020 revealed 124 pauses lifetime, longest recent pause of 4.8 seconds on 06/30/2018. Patient underwent successful dual-chamber pacemaker implantation on 08/03/2018. He had one episode of heartburn on  08/05/2018 with associated heart racing without recurrence.   Patient returns today, reports "doing well". He denies chest pain or shortness of breath. He denies palpitations or heart racing. He denies peripheral edema. He denies recurrent presyncope or syncope. The patient has been more active, return to the [facility] 3 times weekly. Pacemaker interrogation per [facility] 02/28/2019 revealed predominant atrial pacing with longevity of 13.5 years.  The patient has essential hypertension, blood pressure low normal on losartan and amlodipine. Amlodipine was recently decreased from 10 to 5 mg because of apparent low blood pressure episodes. The patient follows a low-sodium, no added salt diet.  Past Medical and Surgical History  Past Medical History Past Medical History:  Diagnosis Date  . Allergic state  . Barrett's esophagus 2001  . BPH (benign prostatic hyperplasia)  s/p TUNA procedure  . 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 Carotid artery occlusion 2013  Partial Blockage left carotid artery  . Cataract cortical, senile 2002  Lens emplant  . Chronic renal insufficiency  . 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 GERD (gastroesophageal reflux disease)  . Gout  . Hyperlipidemia  . Hypertension  . Kidney disease 2003  . Macular degeneration  . Neuropathy of left lower extremity  . Other allergic rhinitis  . Pacemaker   Past Surgical History He has a past surgical history that includes Cholecystectomy (1985); Replacement total knee (Left, 2008); TUNA procedure; Laminectomy Lumbar Spine; Posterior laminectomy / decompression cervical spine (07/03/2004); Colonoscopy (09/18/1997); Colonoscopy (04/18/2002); Colonoscopy (09/08/2005); egd (09/18/1997); egd (12/29/2011, 11/02/2008, 09/08/2005, 10/26/2000); lens eye surgery (Bilateral); Prostate surgery (TUNA 1997); Cataract extraction (2004); Tonsillectomy (1945); Joint replacement (2008); and Knee arthroscopy (2001).    Medications and Allergies  Current Medications  Current Outpatient Medications  Medication  Sig Dispense Refill  . allopurinoL (ZYLOPRIM) 100 MG tablet Take 1 tablet (100 mg total) by mouth once daily 90 tablet 1  . amLODIPine (NORVASC) 10 MG tablet Take 0.5 tablets (5 mg total) by mouth once daily 90 tablet 3  . aspirin 81 MG EC tablet Take 81 mg by mouth once daily  . biotin 10,000 mcg Cap Take by mouth  . diphenhydrAMINE (BENADRYL) 25 mg capsule Take 25 mg by mouth every 6 (six) hours as needed for Itching.  Marland Kitchen FOLIC ACID/MULTIVIT-MIN/LUTEIN (CENTRUM SILVER ORAL) Take 1 tablet by mouth once daily.  Marland Kitchen gabapentin (NEURONTIN) 600 MG tablet Take 1 tablet (600 mg total) by mouth 3 (three) times daily 270 tablet 1  . losartan (COZAAR) 50 MG tablet Take 1 tablet (50 mg total) by mouth once daily 90 tablet 1  . omeprazole (PRILOSEC) 20 MG DR capsule Take 1 capsule (20 mg total) by mouth once daily as needed 90 capsule 1  . traMADoL (ULTRAM) 50 mg tablet Take 1 tablet (50 mg total) by mouth every 8 (eight) hours as needed for Pain 90 tablet 1  . vit C/E/Zn/coppr/lutein/zeaxan (PRESERVISION AREDS-2 ORAL) Take 1 capsule by mouth 2 (two) times daily   No current facility-administered medications for this visit.   Allergies: Sulfa (sulfonamide antibiotics)  Social and Family History  Social History reports that he quit smoking about 40 years ago. He started smoking about 70 years ago. He has a 15.00 pack-year smoking history. He has never used smokeless tobacco. He reports current alcohol use of about 7.0 - 10.0 standard drinks of alcohol per week. He reports that he does not use drugs.  Family History Family History  Problem Relation Age of Onset  . Lung cancer Mother  . Obesity Mother  . Stroke Father  . Macular degeneration Cousin  . Diabetes type II Maternal Aunt  . Diabetes type I Daughter  . Diabetes type II Daughter  . Glaucoma Neg Hx  . Blindness Neg Hx  . High blood pressure  (Hypertension) Neg Hx  . Cataracts Neg Hx  . Thyroid disease Neg Hx  . Vision loss Neg Hx   Review of Systems   Review of Systems: The patient denies chest pain, shortness of breath, orthopnea, paroxysmal nocturnal dyspnea, pedal edema, palpitations, heart racing, reports presyncope, syncope, with postural lightheadedness. Review of 12 Systems is negative except as described above.  Physical Examination   Vitals:BP 116/60  Pulse 69  Resp 18  Ht 193 cm (6\' 4" )  Wt 99.5 kg (219 lb 6.4 oz)  SpO2 100%  BMI 26.71 kg/m  Ht:193 cm (6\' 4" ) Wt:99.5 kg (219 lb 6.4 oz) [id number] surface area is 2.31 meters squared. Body mass index is 26.71 kg/m.  General: Alert and oriented. Well-appearing. No acute distress. HEENT: Pupils equally reactive to light and accomodation  Neck: Supple, no JVD Lungs: Normal effort of breathing; clear to auscultation bilaterally; no wheezes, rales, rhonchi Heart: Regular rate and rhythm. No murmur, rub, or gallop Incision site: Left upper chest wall, steri strips intact and removed, revealing well-healing insertion site without surrounding erythema, warmth, edema, or active drainage or bleeding. Abdomen: nondistended, with normal bowel sounds Extremities: no cyanosis, clubbing, or edema Peripheral Pulses: 2+ radial Skin: Warm, dry, no diaphoresis  Assessment   86 y.o. male with  1. Sinus pause  2. Sick sinus syndrome (CMS-HCC)  3. Essential hypertension  4. Status post placement of implantable loop recorder  5. Hyperlipidemia, unspecified hyperlipidemia type  6. S/P placement  of cardiac pacemaker   86 year old gentleman with infrequent recurrent presyncopal episodes with near loss of consciousness, with unremarkable previous 24-hour Holter monitor. Linq interrogation did not reveal episodes of atrial fibrillation, however it did show sinus pauses the longest lasting up to 5 seconds. The patient reported more frequent episodes of lightheadedness, near syncope,  and generalized weakness. He underwent successful dual-chamber pacemaker implantation on 08/03/2018 with overall clinical improvement. Patient has essential hypertension, blood pressure well controlled on current BP medications.  Plan   1. Continue current medications 2. Counseled patient about low-sodium diet 3. DASH diet printed instructions given to the patient 4. Pacemaker interrogation per [facility] 5. Return to clinic for follow-up in 4 months  No orders of the defined types were placed in this encounter.  Return in about 6 months (around 09/11/2019).  Isaias Cowman, MD PhD [facility]   Electronically signed by Isaias Cowman, MD at 03/14/2019 10:28 AM EST

## 2019-07-12 NOTE — Patient Instructions (Signed)
Your procedure is scheduled on: 07-18-19 MONDAY Report to Same Day Surgery 2nd floor medical mall [facility] Entrance-take elevator on left to 2nd floor.  Check in with surgery information desk.) To find out your arrival time please call [phone] between 1PM - 3PM on 07-15-19 FRIDAY  Remember: Instructions that are not followed completely may result in serious medical risk, up to and including death, or upon the discretion of your surgeon and anesthesiologist your surgery may need to be rescheduled.    _x___ 1. Do not eat food after midnight the night before your procedure. NO GUM OR CANDY AFTER MIDNIGHT. You may drink clear liquids up to 2 hours before you are scheduled to arrive at the hospital for your procedure.  Do not drink clear liquids within 2 hours of your scheduled arrival to the hospital.  Clear liquids include  --Water or Apple juice without pulp  --Gatorade  --Black Coffee or Clear Tea (No milk, no creamers, do not add anything to the coffee or Tea   ____Ensure clear carbohydrate drink on the way to the hospital for bariatric patients  ____Ensure clear carbohydrate drink 3 hours before surgery.     __x__ 2. No Alcohol for 24 hours before or after surgery.   __x__3. No Smoking or e-cigarettes for 24 prior to surgery.  Do not use any chewable tobacco products for at least 6 hour prior to surgery   ____  4. Bring all medications with you on the day of surgery if instructed.    __x__ 5. Notify your doctor if there is any change in your medical condition     (cold, fever, infections).    x___6. On the morning of surgery brush your teeth with toothpaste and water.  You may rinse your mouth with mouth wash if you wish.  Do not swallow any toothpaste or mouthwash.   Do not wear jewelry, make-up, hairpins, clips or nail polish.  Do not wear lotions, powders, or perfumes. You may wear deodorant.  Do not shave 48 hours prior to surgery. Men may shave face and neck.  Do not  bring valuables to the hospital.    [facility] is not responsible for any belongings or valuables.               Contacts, dentures or bridgework may not be worn into surgery.  Leave your suitcase in the car. After surgery it may be brought to your room.  For patients admitted to the hospital, discharge time is determined by your treatment team.  _  Patients discharged the day of surgery will not be allowed to drive home.  You will need someone to drive you home and stay with you the night of your procedure.    Please read over the following fact sheets that you were given:   [facility] Preparing for Surgery and or MRSA Information   _x___ TAKE THE FOLLOWING MEDICATION THE MORNING OF SURGERY WITH A SMALL SIP OF WATER. These include:  1. GABAPENTIN (NEURONTIN)  2. PRILOSEC (OMEPRAZOLE)  3. TAKE AN EXTRA OMEPRAZOLE THE NIGHT BEFORE YOUR SURGERY  4.  5.  6.  ____Fleets enema or Magnesium Citrate as directed.   ____ Use CHG Soap or sage wipes as directed on instruction sheet   ____ Use inhalers on the day of surgery and bring to hospital day of surgery  ____ Stop Metformin and Janumet 2 days prior to surgery.    ____ Take 1/2 of usual insulin dose the night  before surgery and none on the morning  surgery.   _x___ Follow recommendations from Cardiologist, Pulmonologist or PCP regarding stopping Aspirin, Coumadin, Plavix ,Eliquis, Effient, or Pradaxa, and Pletal-INSTRUCTED BY DR Cherrie Gauze OFFICE TO STOP ASPIRIN 5 DAYS PRIOR TO SURGERY (LAST DOSE TODAY)  X____Stop Anti-inflammatories such as Advil, Aleve, Ibuprofen, Motrin, Naproxen, Naprosyn, Goodies powders or aspirin products NOW-OK to take Tylenol    _x___ Stop supplements until after surgery-STOP BIOTIN AND PRESERVISION NOW-MAY RESUME AFTER SURGERY   ____ Bring C-Pap to the hospital.

## 2019-07-12 NOTE — Pre-Procedure Instructions (Signed)
ECG 12-lead4/05/2018 [place] [facility] Component Name Value Ref Range  Vent Rate (bpm) 65   PR Interval (msec) 250   QRS Interval (msec) 86   QT Interval (msec) 394   QTc (msec) 409   Other Result Information  This result has an attachment that is not available.  Result Narrative  Atrial-paced rhythm with prolonged AV conduction Abnormal ECG When compared with ECG of 07-Jul-2018 11:23, Electronic atrial pacemaker has replaced Sinus rhythm I reviewed and concur with this report. Electronically signed [id number], MD, Cristie Hem [id number]) on 08/12/2018 3:01:48 PM  Status Results Details   Encounter

## 2019-07-13 ENCOUNTER — Other Ambulatory Visit: Payer: Self-pay

## 2019-07-13 ENCOUNTER — Ambulatory Visit: Payer: Medicare Other | Admitting: Urology

## 2019-07-13 ENCOUNTER — Encounter
Admission: RE | Admit: 2019-07-13 | Discharge: 2019-07-13 | Disposition: A | Discharge status: Home / Self Care | Payer: Medicare Other | Source: Ambulatory Visit | Attending: Urology | Admitting: Urology

## 2019-07-13 DIAGNOSIS — Z950 Presence of cardiac pacemaker: Secondary | ICD-10-CM | POA: Diagnosis not present

## 2019-07-13 DIAGNOSIS — I10 Essential (primary) hypertension: Secondary | ICD-10-CM | POA: Diagnosis not present

## 2019-07-13 DIAGNOSIS — Z01818 Encounter for other preprocedural examination: Secondary | ICD-10-CM | POA: Insufficient documentation

## 2019-07-13 LAB — CBC
HCT: 43.1 % (ref 39.0–52.0)
Hemoglobin: 14.1 g/dL (ref 13.0–17.0)
MCH: 28.6 pg (ref 26.0–34.0)
MCHC: 32.7 g/dL (ref 30.0–36.0)
MCV: 87.4 fL (ref 80.0–100.0)
Platelets: 268 10*3/uL (ref 150–400)
RBC: 4.93 MIL/uL (ref 4.22–5.81)
RDW: 13.7 % (ref 11.5–15.5)
WBC: 8 10*3/uL (ref 4.0–10.5)
nRBC: 0 % (ref 0.0–0.2)

## 2019-07-13 NOTE — Pre-Procedure Instructions (Signed)
Secure chat with Dr Bertell Maria:  Pt having TURBT on 3-8 with Dr Erlene Quan. Pt has Pacemaker and EKG done today is abnormal. Sinus rhythm has replaced atrial paced rhythm. t-wave inversion more evident in lateral leads. Ekg in Epic for comparison from 2020. Can you review ekg and let me know if pt need cardiac clearance?  If patient does not have new cardiac complaints, then this EKG does not warrant any further investigation. Thank you  No new cardiac symptoms. Thanks so much

## 2019-07-14 ENCOUNTER — Other Ambulatory Visit
Admission: RE | Admit: 2019-07-14 | Discharge: 2019-07-14 | Disposition: A | Discharge status: Home / Self Care | Payer: Medicare Other | Source: Ambulatory Visit | Attending: Urology | Admitting: Urology

## 2019-07-14 DIAGNOSIS — Z20822 Contact with and (suspected) exposure to covid-19: Secondary | ICD-10-CM | POA: Diagnosis not present

## 2019-07-14 DIAGNOSIS — Z01812 Encounter for preprocedural laboratory examination: Secondary | ICD-10-CM | POA: Diagnosis present

## 2019-07-14 LAB — SARS CORONAVIRUS 2 (TAT 6-24 HRS): SARS Coronavirus 2: NEGATIVE

## 2019-07-18 ENCOUNTER — Ambulatory Visit
Admission: RE | Admit: 2019-07-18 | Discharge: 2019-07-18 | Disposition: A | Discharge status: Home / Self Care | Payer: Medicare Other | Attending: Urology | Admitting: Urology

## 2019-07-18 ENCOUNTER — Ambulatory Visit: Payer: Medicare Other

## 2019-07-18 ENCOUNTER — Encounter
Admission: RE | Disposition: A | Discharge status: Home / Self Care | Payer: Self-pay | Source: Home / Self Care | Attending: Urology

## 2019-07-18 ENCOUNTER — Ambulatory Visit: Payer: Medicare Other | Admitting: Anesthesiology

## 2019-07-18 ENCOUNTER — Other Ambulatory Visit: Payer: Self-pay

## 2019-07-18 ENCOUNTER — Encounter: Payer: Self-pay | Admitting: Urology

## 2019-07-18 DIAGNOSIS — N189 Chronic kidney disease, unspecified: Secondary | ICD-10-CM | POA: Insufficient documentation

## 2019-07-18 DIAGNOSIS — C671 Malignant neoplasm of dome of bladder: Secondary | ICD-10-CM

## 2019-07-18 DIAGNOSIS — J449 Chronic obstructive pulmonary disease, unspecified: Secondary | ICD-10-CM | POA: Diagnosis not present

## 2019-07-18 DIAGNOSIS — M1990 Unspecified osteoarthritis, unspecified site: Secondary | ICD-10-CM | POA: Insufficient documentation

## 2019-07-18 DIAGNOSIS — C679 Malignant neoplasm of bladder, unspecified: Secondary | ICD-10-CM | POA: Diagnosis present

## 2019-07-18 DIAGNOSIS — K219 Gastro-esophageal reflux disease without esophagitis: Secondary | ICD-10-CM | POA: Insufficient documentation

## 2019-07-18 DIAGNOSIS — I129 Hypertensive chronic kidney disease with stage 1 through stage 4 chronic kidney disease, or unspecified chronic kidney disease: Secondary | ICD-10-CM | POA: Insufficient documentation

## 2019-07-18 DIAGNOSIS — M109 Gout, unspecified: Secondary | ICD-10-CM | POA: Diagnosis not present

## 2019-07-18 DIAGNOSIS — Z8551 Personal history of malignant neoplasm of bladder: Secondary | ICD-10-CM

## 2019-07-18 DIAGNOSIS — Z85828 Personal history of other malignant neoplasm of skin: Secondary | ICD-10-CM | POA: Insufficient documentation

## 2019-07-18 DIAGNOSIS — Z950 Presence of cardiac pacemaker: Secondary | ICD-10-CM | POA: Insufficient documentation

## 2019-07-18 DIAGNOSIS — D494 Neoplasm of unspecified behavior of bladder: Secondary | ICD-10-CM | POA: Diagnosis not present

## 2019-07-18 DIAGNOSIS — Z87891 Personal history of nicotine dependence: Secondary | ICD-10-CM | POA: Diagnosis not present

## 2019-07-18 HISTORY — PX: TRANSURETHRAL RESECTION OF BLADDER TUMOR WITH MITOMYCIN-C: SHX6459

## 2019-07-18 HISTORY — PX: CYSTOSCOPY W/ RETROGRADES: SHX1426

## 2019-07-18 SURGERY — TRANSURETHRAL RESECTION OF BLADDER TUMOR WITH MITOMYCIN-C
Anesthesia: General | Site: Ureter

## 2019-07-18 MED ORDER — OXYCODONE HCL 5 MG PO TABS: 5.0000 mg | ORAL_TABLET | Freq: Once | ORAL | Status: DC | PRN

## 2019-07-18 MED ORDER — FENTANYL CITRATE (PF) 100 MCG/2ML IJ SOLN: 25.0000 ug | INTRAMUSCULAR | Status: DC | PRN

## 2019-07-18 MED ORDER — ACETAMINOPHEN 10 MG/ML IV SOLN: 1000.0000 mg | Freq: Once | INTRAVENOUS | Status: DC | PRN

## 2019-07-18 MED ORDER — IOHEXOL 180 MG/ML  SOLN
INTRAMUSCULAR | Status: DC | PRN
  Administered 2019-07-18: 11:00:00 20 mL

## 2019-07-18 MED ORDER — PROPOFOL 10 MG/ML IV BOLUS
INTRAVENOUS | Status: DC | PRN
  Administered 2019-07-18: 150 mg via INTRAVENOUS

## 2019-07-18 MED ORDER — FENTANYL CITRATE (PF) 100 MCG/2ML IJ SOLN
INTRAMUSCULAR | Status: AC
  Filled 2019-07-18: qty 2

## 2019-07-18 MED ORDER — EPHEDRINE SULFATE 50 MG/ML IJ SOLN
INTRAMUSCULAR | Status: AC
  Filled 2019-07-18: qty 1

## 2019-07-18 MED ORDER — OXYCODONE HCL 5 MG/5ML PO SOLN: 5.0000 mg | Freq: Once | ORAL | Status: DC | PRN

## 2019-07-18 MED ORDER — DEXAMETHASONE SODIUM PHOSPHATE 10 MG/ML IJ SOLN
INTRAMUSCULAR | Status: DC | PRN
  Administered 2019-07-18: 10 mg via INTRAVENOUS

## 2019-07-18 MED ORDER — LACTATED RINGERS IV SOLN: INTRAVENOUS | Status: DC

## 2019-07-18 MED ORDER — KETOROLAC TROMETHAMINE 30 MG/ML IJ SOLN
INTRAMUSCULAR | Status: DC | PRN
  Administered 2019-07-18: 30 mg via INTRAVENOUS

## 2019-07-18 MED ORDER — FENTANYL CITRATE (PF) 100 MCG/2ML IJ SOLN
INTRAMUSCULAR | Status: DC | PRN
  Administered 2019-07-18: 50 ug via INTRAVENOUS

## 2019-07-18 MED ORDER — CEFAZOLIN SODIUM-DEXTROSE 2-4 GM/100ML-% IV SOLN
INTRAVENOUS | Status: AC
  Filled 2019-07-18: qty 100

## 2019-07-18 MED ORDER — LIDOCAINE HCL (PF) 2 % IJ SOLN
INTRAMUSCULAR | Status: AC
  Filled 2019-07-18: qty 5

## 2019-07-18 MED ORDER — LIDOCAINE HCL (CARDIAC) PF 100 MG/5ML IV SOSY
PREFILLED_SYRINGE | INTRAVENOUS | Status: DC | PRN
  Administered 2019-07-18: 100 mg via INTRAVENOUS

## 2019-07-18 MED ORDER — ONDANSETRON HCL 4 MG/2ML IJ SOLN
INTRAMUSCULAR | Status: DC | PRN
  Administered 2019-07-18: 4 mg via INTRAVENOUS

## 2019-07-18 MED ORDER — ONDANSETRON HCL 4 MG/2ML IJ SOLN: 4.0000 mg | Freq: Once | INTRAMUSCULAR | Status: DC | PRN

## 2019-07-18 MED ORDER — PROPOFOL 10 MG/ML IV BOLUS
INTRAVENOUS | Status: AC
  Filled 2019-07-18: qty 20

## 2019-07-18 MED ORDER — GEMCITABINE CHEMO FOR BLADDER INSTILLATION 2000 MG
INTRAVENOUS | Status: DC | PRN
  Administered 2019-07-18: 2000 mg via INTRAVESICAL

## 2019-07-18 MED ORDER — CEFAZOLIN SODIUM-DEXTROSE 2-4 GM/100ML-% IV SOLN
2.0000 g | INTRAVENOUS | Status: AC
  Administered 2019-07-18: 2 g via INTRAVENOUS

## 2019-07-18 MED ORDER — EPHEDRINE SULFATE 50 MG/ML IJ SOLN
INTRAMUSCULAR | Status: DC | PRN
  Administered 2019-07-18: 10 mg via INTRAVENOUS

## 2019-07-18 SURGICAL SUPPLY — 33 items
BAG DRAIN CYSTO-URO LG1000N (MISCELLANEOUS) ×3 IMPLANT
BAG DRN RND TRDRP ANRFLXCHMBR (UROLOGICAL SUPPLIES) ×2
BAG URINE DRAIN 2000ML AR STRL (UROLOGICAL SUPPLIES) ×3 IMPLANT
BRUSH SCRUB EZ  4% CHG (MISCELLANEOUS) ×3
BRUSH SCRUB EZ 4% CHG (MISCELLANEOUS) ×2 IMPLANT
CATH FOLEY 2WAY  5CC 16FR (CATHETERS) ×3
CATH FOLEY 2WAY 5CC 16FR (CATHETERS) ×2
CATH URETL 5X70 OPEN END (CATHETERS) ×3 IMPLANT
CATH URTH 16FR FL 2W BLN LF (CATHETERS) ×2 IMPLANT
CRADLE LAMINECT ARM (MISCELLANEOUS) ×3 IMPLANT
DRAPE UTILITY 15X26 TOWEL STRL (DRAPES) ×3 IMPLANT
DRSG TELFA 4X3 1S NADH ST (GAUZE/BANDAGES/DRESSINGS) ×3 IMPLANT
ELECT LOOP 22F BIPOLAR SML (ELECTROSURGICAL)
ELECT REM PT RETURN 9FT ADLT (ELECTROSURGICAL) ×3
ELECTRODE LOOP 22F BIPOLAR SML (ELECTROSURGICAL) IMPLANT
ELECTRODE REM PT RTRN 9FT ADLT (ELECTROSURGICAL) IMPLANT
GLOVE BIO SURGEON STRL SZ 6.5 (GLOVE) ×3 IMPLANT
GOWN STRL REUS W/ TWL LRG LVL3 (GOWN DISPOSABLE) ×4 IMPLANT
GOWN STRL REUS W/TWL LRG LVL3 (GOWN DISPOSABLE) ×6
GUIDEWIRE STR DUAL SENSOR (WIRE) ×3 IMPLANT
KIT TURNOVER CYSTO (KITS) ×3 IMPLANT
LOOP CUT BIPOLAR 24F LRG (ELECTROSURGICAL) IMPLANT
NDL SAFETY ECLIPSE 18X1.5 (NEEDLE) ×2 IMPLANT
NEEDLE HYPO 18GX1.5 SHARP (NEEDLE) ×3
PACK CYSTO AR (MISCELLANEOUS) ×3 IMPLANT
SET CYSTO W/LG BORE CLAMP LF (SET/KITS/TRAYS/PACK) ×3 IMPLANT
SET IRRIG Y TYPE TUR BLADDER L (SET/KITS/TRAYS/PACK) ×3 IMPLANT
SOL .9 NS 3000ML IRR  AL (IV SOLUTION) ×3
SOL .9 NS 3000ML IRR AL (IV SOLUTION) ×2
SOL .9 NS 3000ML IRR UROMATIC (IV SOLUTION) ×2 IMPLANT
SURGILUBE 2OZ TUBE FLIPTOP (MISCELLANEOUS) ×3 IMPLANT
SYRINGE IRR TOOMEY STRL 70CC (SYRINGE) ×3 IMPLANT
WATER STERILE IRR 1000ML POUR (IV SOLUTION) ×3 IMPLANT

## 2019-07-18 NOTE — Anesthesia Preprocedure Evaluation (Addendum)
Anesthesia Evaluation  Patient identified by MRN, date of birth, ID band Patient awake    Reviewed: Allergy & Precautions, NPO status , Patient's Chart, lab work & pertinent test results  History of Anesthesia Complications Negative for: history of anesthetic complications  Airway Mallampati: II  TM Distance: >3 FB Neck ROM: Full    Dental no notable dental hx. (+) Teeth Intact, Partial Upper, Dental Advisory Given   Pulmonary neg pulmonary ROS, neg sleep apnea, neg COPD, Patient abstained from smoking.Not current smoker, former smoker,    Pulmonary exam normal breath sounds clear to auscultation       Cardiovascular Exercise Tolerance: Good METShypertension, Pt. on medications (-) CAD and (-) Past MI + dysrhythmias + pacemaker  Rhythm:Regular Rate:Normal - Systolic murmurs Pacemaker for sinus pauses? Not pacer dependent EKG shows 1st degree AVB, no paced beats Patient reports one episode of "chest pain" and lightheadedness on week prior after making breakfast. He said he felt lightheaded, sat down for 15 minutes and drank water, and it resolved. Never happened prior, and has not happened since. Patient believes he was dehydrated. I advised him to bring this up with his cardiologist when he sees him in a few weeks   Neuro/Psych negative neurological ROS  negative psych ROS   GI/Hepatic GERD  Controlled,(+)     (-) substance abuse  ,   Endo/Other  neg diabetes  Renal/GU CRFRenal disease     Musculoskeletal  (+) Arthritis , Osteoarthritis,    Abdominal   Peds  Hematology   Anesthesia Other Findings Past Medical History: No date: Arthritis No date: Cancer ([place])     Comment:  Basal Cell Skin Cancer No date: Chronic kidney disease     Comment:  stage 111 No date: Dysrhythmia No date: GERD (gastroesophageal reflux disease) No date: Gout No date: History of kidney stones No date: Hypertension No date:  Neuropathy No date: Spinal stenosis  Reproductive/Obstetrics                            Anesthesia Physical Anesthesia Plan  ASA: III  Anesthesia Plan: General   Post-op Pain Management:    Induction: Intravenous  PONV Risk Score and Plan: 3 and Ondansetron and Dexamethasone  Airway Management Planned: LMA  Additional Equipment: None  Intra-op Plan:   Post-operative Plan: Extubation in OR  Informed Consent: I have reviewed the patients History and Physical, chart, labs and discussed the procedure including the risks, benefits and alternatives for the proposed anesthesia with the patient or authorized representative who has indicated his/her understanding and acceptance.     Dental advisory given  Plan Discussed with: CRNA and Surgeon  Anesthesia Plan Comments: (Discussed risks of anesthesia with patient, including PONV, sore throat, lip/dental damage. Rare risks discussed as well, such as cardiorespiratory and neurological sequelae. Patient understands.  Patient appears to not be pacer dependent, and procedure is infraumblicial, low risk for electrical interference. Magnet will be available if needed.)        Anesthesia Quick Evaluation

## 2019-07-18 NOTE — Op Note (Signed)
Date of procedure: 07/18/19  Preoperative diagnosis:  1. Bladder tumor, dome 2. History of recurrent bladder cancer  Postoperative diagnosis:  1. Same as above  Procedure: 1. TURBT, small 2. Bilateral retrograde pyelogram 3. Instillation of intravesical gemcitabine  Surgeon: Hollice Espy, MD  Anesthesia: General  Complications: None  Intraoperative findings: Carpeting of low-grade appearing papillary tumor at dome of bladder, just to the right of previously fulgurated area in the office, measuring approximately 1 cm.  There is a few small satellite lesions as well ranging from 1 to 2 mm.  Unremarkable retrograde pyelogram.  EBL: Minimal  Specimens: Bladder tumor, dome  Drains: 16 French Foley catheter  Indication: Michael Holloway is a 86 y.o. patient with recurrent bladder cancer status post multiple TURBTs and more recently fulguration in the office found to have a large recurrence at the dome not amenable to an office fulguration due to size.  After reviewing the management options for treatment, he elected to proceed with the above surgical procedure(s). We have discussed the potential benefits and risks of the procedure, side effects of the proposed treatment, the likelihood of the patient achieving the goals of the procedure, and any potential problems that might occur during the procedure or recuperation. Informed consent has been obtained.  Description of procedure:  The patient was taken to the operating room and general anesthesia was induced.  The patient was placed in the dorsal lithotomy position, prepped and draped in the usual sterile fashion, and preoperative antibiotics were administered. A preoperative time-out was performed.   21 [place] scope was advanced per urethra into the bladder.  Bladder is carefully inspected.  There was an area of erythema and shaggy necrosis on the left dome of the bladder at the site of his most recent fulguration.  Just to the right of  this, there was an area of approximately 1 cm with a low-grade carpeting-like appearance with a few smaller 1 to 2 mm satellite lesions.  The remainder of the bladder was completely unremarkable including the prostatic fossa which is slightly irregular but no lesions or tumors.  Bladder was mildly trabeculated.  Using a 5 [place] open-ended ureteral catheter, gentle retrograde pyelogram was performed by injecting contrast solution.  This revealed no hydroureteronephrosis or filling defects.  Both of the renal units drained promptly.  Next, cold cup biopsy forceps were used to piece wise resect the tumor and small satellite lesions at the dome.  An additional sample of the recent fulguration site was also included in the biopsy specimen.  Bugbee electrocautery was then used to completely fulgurate the biopsy sites as well as all adjacent areas for local destruction and control.  Hemostasis at this point was excellent.  There is no concern for perforation or severe bleeding thus the decision was made to administer postoperative intravesical chemotherapy.  A 16 French Foley catheter was placed at the end the procedure and balloon was filled with 12 cc of sterile water.  The patient was taken to PACU after he was reversed myesthesia.  In the PACU, gemcitabine, 2000 mg was allowed to dwell for 1 hour.  This was well-tolerated.  After 1 hour, this was drained and the Foley catheter was removed.  Plan: I will call the patient with his pathology results and follow-up plan.  He is agreeable to this.  All questions answered.  Hollice Espy, M.D.

## 2019-07-18 NOTE — Interval H&P Note (Signed)
History and Physical Interval Note:  07/18/2019 10:44 AM  Rowland Lathe  has presented today for surgery, with the diagnosis of bladder cancer.  The various methods of treatment have been discussed with the patient and family. After consideration of risks, benefits and other options for treatment, the patient has consented to  Procedure(s): TRANSURETHRAL RESECTION OF BLADDER TUMOR WITH gemcitabine (N/A) CYSTOSCOPY WITH RETROGRADE PYELOGRAM (Bilateral) as a surgical intervention.  The patient's history has been reviewed, patient examined, no change in status, stable for surgery.  I have reviewed the patient's chart and labs.  Questions were answered to the patient's satisfaction.    RRR CTAB   Hollice Espy

## 2019-07-18 NOTE — Anesthesia Postprocedure Evaluation (Signed)
Anesthesia Post Note  Patient: Michael Holloway  Procedure(s) Performed: TRANSURETHRAL RESECTION OF BLADDER TUMOR WITH gemcitabine (N/A Bladder) CYSTOSCOPY WITH RETROGRADE PYELOGRAM (Bilateral Ureter)  Patient location during evaluation: PACU Anesthesia Type: General Level of consciousness: awake and alert Pain management: pain level controlled Vital Signs Assessment: post-procedure vital signs reviewed and stable Respiratory status: spontaneous breathing, nonlabored ventilation, respiratory function stable and patient connected to nasal cannula oxygen Cardiovascular status: blood pressure returned to baseline and stable Postop Assessment: no apparent nausea or vomiting Anesthetic complications: no     Last Vitals:  Vitals:   07/18/19 1210 07/18/19 1213  BP:  (!) 151/87  Pulse: 67 60  Resp: 14 13  Temp:    SpO2: 100% 98%    Last Pain:  Vitals:   07/18/19 1210  TempSrc:   PainSc: 0-No pain                 Arita Miss

## 2019-07-18 NOTE — Transfer of Care (Signed)
Immediate Anesthesia Transfer of Care Note  Patient: Michael Holloway  Procedure(s) Performed: TRANSURETHRAL RESECTION OF BLADDER TUMOR WITH gemcitabine (N/A Bladder) CYSTOSCOPY WITH RETROGRADE PYELOGRAM (Bilateral Ureter)  Patient Location: PACU  Anesthesia Type:General  Level of Consciousness: drowsy and patient cooperative  Airway & Oxygen Therapy: Patient Spontanous Breathing and Patient connected to face mask oxygen  Post-op Assessment: Report given to RN and Post -op Vital signs reviewed and stable  Post vital signs: Reviewed and stable  Last Vitals:  Vitals Value Taken Time  BP 132/69 07/18/19 1141  Temp    Pulse    Resp    SpO2      Last Pain:  Vitals:   07/18/19 1141  TempSrc:   PainSc: (P) 0-No pain      Patients Stated Pain Goal: 0 ([id number])  Complications: No apparent anesthesia complications

## 2019-07-18 NOTE — Progress Notes (Signed)
Foley unclapmed, drained 100cc , foley dced

## 2019-07-18 NOTE — Anesthesia Procedure Notes (Signed)
Procedure Name: LMA Insertion Date/Time: 07/18/2019 11:10 AM Performed by: Jonna Clark, CRNA Pre-anesthesia Checklist: Patient identified, Patient being monitored, Timeout performed, Emergency Drugs available and Suction available Patient Re-evaluated:Patient Re-evaluated prior to induction Oxygen Delivery Method: Circle system utilized Preoxygenation: Pre-oxygenation with 100% oxygen Induction Type: IV induction Ventilation: Mask ventilation without difficulty LMA: LMA inserted LMA Size: 5.0 Tube type: Oral Number of attempts: 1 Placement Confirmation: positive ETCO2 and breath sounds checked- equal and bilateral Tube secured with: Tape Dental Injury: Teeth and Oropharynx as per pre-operative assessment

## 2019-07-18 NOTE — Discharge Instructions (Signed)
AMBULATORY SURGERY  DISCHARGE INSTRUCTIONS   1) The drugs that you were given will stay in your system until tomorrow so for the next 24 hours you should not:  A) Drive an automobile B) Make any legal decisions C) Drink any alcoholic beverage   2) You may resume regular meals tomorrow.  Today it is better to start with liquids and gradually work up to solid foods.  You may eat anything you prefer, but it is better to start with liquids, then soup and crackers, and gradually work up to solid foods.   3) Please notify your doctor immediately if you have any unusual bleeding, trouble breathing, redness and pain at the surgery site, drainage, fever, or pain not relieved by medication.    4) Additional Instructions:        Please contact your physician with any problems or Same Day Surgery at [phone], Monday through Friday 6 am to 4 pm, or [place] at [facility] number at [phone].AMBULATORY SURGERY  DISCHARGE INSTRUCTIONS   5) The drugs that you were given will stay in your system until tomorrow so for the next 24 hours you should not:  D) Drive an automobile E) Make any legal decisions F) Drink any alcoholic beverage   6) You may resume regular meals tomorrow.  Today it is better to start with liquids and gradually work up to solid foods.  You may eat anything you prefer, but it is better to start with liquids, then soup and crackers, and gradually work up to solid foods.   7) Please notify your doctor immediately if you have any unusual bleeding, trouble breathing, redness and pain at the surgery site, drainage, fever, or pain not relieved by medication.    8) Additional Instructions:        Please contact your physician with any problems or Same Day Surgery at [phone], Monday through Friday 6 am to 4 pm, or [place] at [facility] number at [phone].Transurethral Resection of Bladder Tumor (TURBT) or Bladder  Biopsy   Definition:  Transurethral Resection of the Bladder Tumor is a surgical procedure used to diagnose and remove tumors within the bladder. TURBT is the most common treatment for early stage bladder cancer.  General instructions:     Your recent bladder surgery requires very little post hospital care but some definite precautions.  Despite the fact that no skin incisions were used, the area around the bladder incisions are raw and covered with scabs to promote healing and prevent bleeding. Certain precautions are needed to insure that the scabs are not disturbed over the next 2-4 weeks while the healing proceeds.  Because the raw surface inside your bladder and the irritating effects of urine you may expect frequency of urination and/or urgency (a stronger desire to urinate) and perhaps even getting up at night more often. This will usually resolve or improve slowly over the healing period. You may see some blood in your urine over the first 6 weeks. Do not be alarmed, even if the urine was clear for a while. Get off your feet and drink lots of fluids until clearing occurs. If you start to pass clots or don't improve call us.  Diet:  You may return to your normal diet immediately. Because of the raw surface of your bladder, alcohol, spicy foods, foods high in acid and drinks with caffeine may cause irritation or frequency and should be used in moderation. To keep your urine flowing freely and avoid constipation, drink plenty  of fluids during the day (8-10 glasses). Tip: Avoid cranberry juice because it is very acidic.  Activity:  Your physical activity doesn't need to be restricted. However, if you are very active, you may see some blood in the urine. We suggest that you reduce your activity under the circumstances until the bleeding has stopped.  Bowels:  It is important to keep your bowels regular during the postoperative period. Straining with bowel movements can cause bleeding. A  bowel movement every other day is reasonable. Use a mild laxative if needed, such as milk of magnesia 2-3 tablespoons, or 2 Dulcolax tablets. Call if you continue to have problems. If you had been taking narcotics for pain, before, during or after your surgery, you may be constipated. Take a laxative if necessary.    Medication:  You should resume your pre-surgery medications unless told not to. In addition you may be given an antibiotic to prevent or treat infection. Antibiotics are not always necessary. All medication should be taken as prescribed until the bottles are finished unless you are having an unusual reaction to one of the drugs.   [place], [place] [id number] [phone]

## 2019-07-19 LAB — SURGICAL PATHOLOGY

## 2019-07-20 ENCOUNTER — Telehealth: Payer: Self-pay | Admitting: *Deleted

## 2019-07-20 NOTE — Telephone Encounter (Addendum)
Patient informed, voiced understanding.  Cysto scheduled.    ----- Message from Hollice Espy, MD sent at 07/19/2019  1:01 PM EST ----- Tumor was low grade and noninvasive which is good.  Repeat cyst in 3 months as scheduled (please check)  Hollice Espy, MD

## 2019-08-29 DIAGNOSIS — M1711 Unilateral primary osteoarthritis, right knee: Secondary | ICD-10-CM | POA: Insufficient documentation

## 2019-08-29 DIAGNOSIS — M1120 Other chondrocalcinosis, unspecified site: Secondary | ICD-10-CM | POA: Insufficient documentation

## 2019-09-14 ENCOUNTER — Other Ambulatory Visit: Payer: Self-pay | Admitting: Surgery

## 2019-09-19 ENCOUNTER — Encounter
Admission: RE | Admit: 2019-09-19 | Discharge: 2019-09-19 | Disposition: A | Discharge status: Home / Self Care | Payer: Medicare Other | Source: Ambulatory Visit | Attending: Surgery | Admitting: Surgery

## 2019-09-19 ENCOUNTER — Other Ambulatory Visit: Payer: Self-pay

## 2019-09-19 DIAGNOSIS — Z01812 Encounter for preprocedural laboratory examination: Secondary | ICD-10-CM | POA: Diagnosis present

## 2019-09-19 NOTE — Patient Instructions (Addendum)
Your procedure is scheduled on: 09-29-19 THURSDAY Report to Same Day Surgery 2nd floor medical mall [facility] Entrance-take elevator on left to 2nd floor.  Check in with surgery information desk.) To find out your arrival time please call [phone] between 1PM - 3PM on 09-28-19 [facility] [address]  Remember: Instructions that are not followed completely may result in serious medical risk, up to and including death, or upon the discretion of your surgeon and anesthesiologist your surgery may need to be rescheduled.    _x___ 1. Do not eat food after midnight the night before your procedure. NO GUM OR CANDY AFTER MIDNIGHT. You may drink clear liquids up to 2 hours before you are scheduled to arrive at the hospital for your procedure.  Do not drink clear liquids within 2 hours of your scheduled arrival to the hospital.  Clear liquids include  --Water or Apple juice without pulp  --Gatorade  --Black Coffee or Clear Tea (No milk, no creamers, do not add anything to the coffee or Tea-sugar ok to add)   ____Ensure clear carbohydrate drink on the way to the hospital for bariatric patients  _X___Ensure clear carbohydrate drink-FINISH DRINK 2 HOURS PRIOR TO ARRIVAL [place]    __x__ 2. No Alcohol for 24 hours before or after surgery.   __x__3. No Smoking or e-cigarettes for 24 prior to surgery.  Do not use any chewable tobacco products for at least 6 hour prior to surgery   ____  4. Bring all medications with you on the day of surgery if instructed.    __x__ 5. Notify your doctor if there is any change in your medical condition     (cold, fever, infections).    x___6. On the morning of surgery brush your teeth with toothpaste and water.  You may rinse your mouth with mouth wash if you wish.  Do not swallow any toothpaste or mouthwash.   Do not wear jewelry, make-up, hairpins, clips or nail polish.  Do not wear lotions, powders, or perfumes.  Do not shave 48 hours prior to  surgery. Men may shave face and neck.  Do not bring valuables to the hospital.    [facility] is not responsible for any belongings or valuables.               Contacts, dentures or bridgework may not be worn into surgery.  Leave your suitcase in the car. After surgery it may be brought to your room.  For patients admitted to the hospital, discharge time is determined by your  treatment team.  _  Patients discharged the day of surgery will not be allowed to drive home.  You will need someone to drive you home and stay with you the night of your procedure.    Please read over the following fact sheets that you were given:   [facility] Preparing for Surgery and MRSA Information/INCENTIVE SPIROMETER INSTRUCTIONS  _x___ TAKE THE FOLLOWING MEDICATION THE MORNING OF SURGERY WITH A SMALL SIP OF WATER. These include:  1. ZYRTEC (CETIRIZINE)  2. GABAPENTIN (NEURONTIN)  3. PRILOSEC (OMEPRAZOLE)  4. TAKE AN EXTRA PRILOSEC THE NIGHT BEFORE YOUR SURGERY  5.  6.  ____Fleets enema or Magnesium Citrate as directed.   _x___ Use CHG Soap or sage wipes as directed on instruction sheet   ____ Use inhalers on the day of surgery and bring to hospital day of surgery  ____ Stop Metformin and Janumet 2 days prior to surgery.  ____ Take 1/2 of usual insulin dose the night before surgery and none on the morning surgery.   _x___ Follow recommendations from Cardiologist, Pulmonologist or PCP regarding stopping Aspirin, Coumadin, Plavix ,Eliquis, Effient, or Pradaxa, and Pletal-STOP ASPIRIN 7 DAYS PRIOR TO SURGERY  X____Stop Anti-inflammatories such as Advil, Aleve, Ibuprofen, Motrin, Naproxen, Naprosyn, Goodies powders or aspirin products 7 DAYS PRIOR TO SURGERY-OK to take Tylenol OR TRAMADOL IF NEEDED   _x___ Stop supplements until after surgery-STOP BIOTIN, PRESERVISION AND NERVOGEN 7 DAYS PRIOR TO SURGERY-MAY RESUME AFTER YOUR SURGERY   ____ Bring C-Pap to the hospital.

## 2019-09-22 ENCOUNTER — Encounter
Admission: RE | Admit: 2019-09-22 | Discharge: 2019-09-22 | Disposition: A | Discharge status: Home / Self Care | Payer: Medicare Other | Source: Ambulatory Visit | Attending: Surgery | Admitting: Surgery

## 2019-09-22 ENCOUNTER — Other Ambulatory Visit: Payer: Self-pay

## 2019-09-22 DIAGNOSIS — Z01812 Encounter for preprocedural laboratory examination: Secondary | ICD-10-CM | POA: Diagnosis not present

## 2019-09-22 LAB — URINALYSIS, ROUTINE W REFLEX MICROSCOPIC
Bacteria, UA: NONE SEEN
Bilirubin Urine: NEGATIVE
Glucose, UA: 50 mg/dL — AB
Hgb urine dipstick: NEGATIVE
Ketones, ur: 5 mg/dL — AB
Nitrite: NEGATIVE
Protein, ur: 30 mg/dL — AB
Specific Gravity, Urine: 1.02 (ref 1.005–1.030)
pH: 5 (ref 5.0–8.0)

## 2019-09-22 LAB — CBC WITH DIFFERENTIAL/PLATELET
Abs Immature Granulocytes: 0.02 10*3/uL (ref 0.00–0.07)
Basophils Absolute: 0.1 10*3/uL (ref 0.0–0.1)
Basophils Relative: 1 %
Eosinophils Absolute: 0.2 10*3/uL (ref 0.0–0.5)
Eosinophils Relative: 3 %
HCT: 42 % (ref 39.0–52.0)
Hemoglobin: 14 g/dL (ref 13.0–17.0)
Immature Granulocytes: 0 %
Lymphocytes Relative: 12 %
Lymphs Abs: 0.9 10*3/uL (ref 0.7–4.0)
MCH: 28.6 pg (ref 26.0–34.0)
MCHC: 33.3 g/dL (ref 30.0–36.0)
MCV: 85.7 fL (ref 80.0–100.0)
Monocytes Absolute: 0.5 10*3/uL (ref 0.1–1.0)
Monocytes Relative: 7 %
Neutro Abs: 5.9 10*3/uL (ref 1.7–7.7)
Neutrophils Relative %: 77 %
Platelets: 263 10*3/uL (ref 150–400)
RBC: 4.9 MIL/uL (ref 4.22–5.81)
RDW: 14.1 % (ref 11.5–15.5)
WBC: 7.6 10*3/uL (ref 4.0–10.5)
nRBC: 0 % (ref 0.0–0.2)

## 2019-09-22 LAB — COMPREHENSIVE METABOLIC PANEL
ALT: 13 U/L (ref 0–44)
AST: 19 U/L (ref 15–41)
Albumin: 3.9 g/dL (ref 3.5–5.0)
Alkaline Phosphatase: 106 U/L (ref 38–126)
Anion gap: 9 (ref 5–15)
BUN: 29 mg/dL — ABNORMAL HIGH (ref 8–23)
CO2: 25 mmol/L (ref 22–32)
Calcium: 9.1 mg/dL (ref 8.9–10.3)
Chloride: 107 mmol/L (ref 98–111)
Creatinine, Ser: 1.81 mg/dL — ABNORMAL HIGH (ref 0.61–1.24)
GFR calc Af Amer: 38 mL/min — ABNORMAL LOW (ref >60)
GFR calc non Af Amer: 33 mL/min — ABNORMAL LOW (ref >60)
Glucose, Bld: 113 mg/dL — ABNORMAL HIGH (ref 70–99)
Potassium: 4.5 mmol/L (ref 3.5–5.1)
Sodium: 141 mmol/L (ref 135–145)
Total Bilirubin: 0.8 mg/dL (ref 0.3–1.2)
Total Protein: 7 g/dL (ref 6.5–8.1)

## 2019-09-22 LAB — TYPE AND SCREEN
ABO/RH(D): B POS
Antibody Screen: NEGATIVE

## 2019-09-22 LAB — SURGICAL PCR SCREEN
MRSA, PCR: NEGATIVE
Staphylococcus aureus: NEGATIVE

## 2019-09-27 ENCOUNTER — Other Ambulatory Visit
Admission: RE | Admit: 2019-09-27 | Discharge: 2019-09-27 | Disposition: A | Discharge status: Home / Self Care | Payer: Medicare Other | Source: Ambulatory Visit | Attending: Surgery | Admitting: Surgery

## 2019-09-27 DIAGNOSIS — Z01812 Encounter for preprocedural laboratory examination: Secondary | ICD-10-CM | POA: Insufficient documentation

## 2019-09-27 DIAGNOSIS — Z20822 Contact with and (suspected) exposure to covid-19: Secondary | ICD-10-CM | POA: Insufficient documentation

## 2019-09-27 LAB — SARS CORONAVIRUS 2 (TAT 6-24 HRS): SARS Coronavirus 2: NEGATIVE

## 2019-09-29 ENCOUNTER — Inpatient Hospital Stay: Payer: Medicare Other

## 2019-09-29 ENCOUNTER — Inpatient Hospital Stay
Admission: RE | Admit: 2019-09-29 | Discharge: 2019-10-01 | DRG: 470 | Disposition: A | Discharge status: Home / Self Care | Payer: Medicare Other | Attending: Surgery | Admitting: Surgery

## 2019-09-29 ENCOUNTER — Encounter: Payer: Self-pay | Admitting: Surgery

## 2019-09-29 ENCOUNTER — Other Ambulatory Visit: Payer: Self-pay

## 2019-09-29 ENCOUNTER — Encounter
Admission: RE | Disposition: A | Discharge status: Home / Self Care | Payer: Self-pay | Source: Home / Self Care | Attending: Surgery

## 2019-09-29 DIAGNOSIS — Z87442 Personal history of urinary calculi: Secondary | ICD-10-CM | POA: Diagnosis not present

## 2019-09-29 DIAGNOSIS — Z882 Allergy status to sulfonamides status: Secondary | ICD-10-CM | POA: Diagnosis not present

## 2019-09-29 DIAGNOSIS — I129 Hypertensive chronic kidney disease with stage 1 through stage 4 chronic kidney disease, or unspecified chronic kidney disease: Secondary | ICD-10-CM | POA: Diagnosis present

## 2019-09-29 DIAGNOSIS — Z950 Presence of cardiac pacemaker: Secondary | ICD-10-CM

## 2019-09-29 DIAGNOSIS — Z9049 Acquired absence of other specified parts of digestive tract: Secondary | ICD-10-CM | POA: Diagnosis not present

## 2019-09-29 DIAGNOSIS — K219 Gastro-esophageal reflux disease without esophagitis: Secondary | ICD-10-CM | POA: Diagnosis present

## 2019-09-29 DIAGNOSIS — Z96652 Presence of left artificial knee joint: Secondary | ICD-10-CM | POA: Diagnosis present

## 2019-09-29 DIAGNOSIS — M1711 Unilateral primary osteoarthritis, right knee: Principal | ICD-10-CM | POA: Diagnosis present

## 2019-09-29 DIAGNOSIS — E785 Hyperlipidemia, unspecified: Secondary | ICD-10-CM | POA: Diagnosis present

## 2019-09-29 DIAGNOSIS — I6522 Occlusion and stenosis of left carotid artery: Secondary | ICD-10-CM | POA: Diagnosis present

## 2019-09-29 DIAGNOSIS — G629 Polyneuropathy, unspecified: Secondary | ICD-10-CM | POA: Diagnosis present

## 2019-09-29 DIAGNOSIS — Z20822 Contact with and (suspected) exposure to covid-19: Secondary | ICD-10-CM | POA: Diagnosis present

## 2019-09-29 DIAGNOSIS — Z79899 Other long term (current) drug therapy: Secondary | ICD-10-CM

## 2019-09-29 DIAGNOSIS — Z823 Family history of stroke: Secondary | ICD-10-CM

## 2019-09-29 DIAGNOSIS — M109 Gout, unspecified: Secondary | ICD-10-CM | POA: Diagnosis present

## 2019-09-29 DIAGNOSIS — Z7982 Long term (current) use of aspirin: Secondary | ICD-10-CM

## 2019-09-29 DIAGNOSIS — N183 Chronic kidney disease, stage 3 unspecified: Secondary | ICD-10-CM | POA: Diagnosis present

## 2019-09-29 DIAGNOSIS — K2270 Barrett's esophagus without dysplasia: Secondary | ICD-10-CM | POA: Diagnosis present

## 2019-09-29 DIAGNOSIS — Z96651 Presence of right artificial knee joint: Secondary | ICD-10-CM

## 2019-09-29 DIAGNOSIS — Z79891 Long term (current) use of opiate analgesic: Secondary | ICD-10-CM

## 2019-09-29 DIAGNOSIS — Z85828 Personal history of other malignant neoplasm of skin: Secondary | ICD-10-CM

## 2019-09-29 DIAGNOSIS — N400 Enlarged prostate without lower urinary tract symptoms: Secondary | ICD-10-CM | POA: Diagnosis present

## 2019-09-29 DIAGNOSIS — Z801 Family history of malignant neoplasm of trachea, bronchus and lung: Secondary | ICD-10-CM

## 2019-09-29 DIAGNOSIS — Z87891 Personal history of nicotine dependence: Secondary | ICD-10-CM

## 2019-09-29 DIAGNOSIS — Z833 Family history of diabetes mellitus: Secondary | ICD-10-CM

## 2019-09-29 HISTORY — PX: TOTAL KNEE ARTHROPLASTY: SHX125

## 2019-09-29 SURGERY — ARTHROPLASTY, KNEE, TOTAL
Anesthesia: Spinal | Site: Knee | Laterality: Right

## 2019-09-29 MED ORDER — OCUVITE-LUTEIN PO CAPS
2.0000 | ORAL_CAPSULE | Freq: Two times a day (BID) | ORAL | Status: DC
  Administered 2019-09-29 – 2019-09-30 (×3): 2 via ORAL
  Administered 2019-10-01: 1 via ORAL
  Filled 2019-09-29 (×6): qty 2

## 2019-09-29 MED ORDER — ACETAMINOPHEN 10 MG/ML IV SOLN
INTRAVENOUS | Status: DC | PRN
  Administered 2019-09-29: 1000 mg via INTRAVENOUS

## 2019-09-29 MED ORDER — DEXAMETHASONE SODIUM PHOSPHATE 10 MG/ML IJ SOLN
INTRAMUSCULAR | Status: DC | PRN
Start: 2019-09-29 — End: 2019-09-29
  Administered 2019-09-29: 10 mg via INTRAVENOUS

## 2019-09-29 MED ORDER — TRANEXAMIC ACID 1000 MG/10ML IV SOLN
INTRAVENOUS | Status: AC
  Filled 2019-09-29: qty 10

## 2019-09-29 MED ORDER — GABAPENTIN 600 MG PO TABS
600.0000 mg | ORAL_TABLET | Freq: Three times a day (TID) | ORAL | Status: DC
  Administered 2019-09-29 – 2019-10-01 (×6): 600 mg via ORAL
  Filled 2019-09-29 (×10): qty 1

## 2019-09-29 MED ORDER — DOCUSATE SODIUM 100 MG PO CAPS
100.0000 mg | ORAL_CAPSULE | Freq: Two times a day (BID) | ORAL | Status: DC
  Administered 2019-09-29 – 2019-10-01 (×4): 100 mg via ORAL
  Filled 2019-09-29 (×3): qty 1

## 2019-09-29 MED ORDER — METOCLOPRAMIDE HCL 10 MG PO TABS: 5.0000 mg | ORAL_TABLET | Freq: Three times a day (TID) | ORAL | Status: DC | PRN

## 2019-09-29 MED ORDER — OXYCODONE HCL 5 MG PO TABS
5.0000 mg | ORAL_TABLET | ORAL | Status: DC | PRN
  Administered 2019-09-29 (×3): 5 mg via ORAL
  Administered 2019-09-30: 10 mg via ORAL
  Administered 2019-09-30 (×2): 5 mg via ORAL
  Administered 2019-10-01: 10 mg via ORAL
  Filled 2019-09-29 (×5): qty 1
  Filled 2019-09-29 (×2): qty 2

## 2019-09-29 MED ORDER — OXYCODONE HCL 5 MG PO TABS: 5.0000 mg | ORAL_TABLET | Freq: Once | ORAL | Status: DC | PRN

## 2019-09-29 MED ORDER — PROPOFOL 500 MG/50ML IV EMUL
INTRAVENOUS | Status: AC
  Filled 2019-09-29: qty 50

## 2019-09-29 MED ORDER — BIOTIN 10 MG PO CAPS: 10.0000 mg | ORAL_CAPSULE | Freq: Every day | ORAL | Status: DC

## 2019-09-29 MED ORDER — METOCLOPRAMIDE HCL 5 MG/ML IJ SOLN: 5.0000 mg | Freq: Three times a day (TID) | INTRAMUSCULAR | Status: DC | PRN

## 2019-09-29 MED ORDER — BUPIVACAINE HCL (PF) 0.5 % IJ SOLN
INTRAMUSCULAR | Status: DC | PRN
  Administered 2019-09-29: 3 mL

## 2019-09-29 MED ORDER — CHLORHEXIDINE GLUCONATE CLOTH 2 % EX PADS
6.0000 | MEDICATED_PAD | Freq: Every day | CUTANEOUS | Status: DC
  Administered 2019-09-29 – 2019-09-30 (×2): 6 via TOPICAL

## 2019-09-29 MED ORDER — CEFAZOLIN SODIUM-DEXTROSE 2-4 GM/100ML-% IV SOLN
2.0000 g | Freq: Four times a day (QID) | INTRAVENOUS | Status: AC
  Administered 2019-09-29 – 2019-09-30 (×3): 2 g via INTRAVENOUS
  Filled 2019-09-29 (×3): qty 100

## 2019-09-29 MED ORDER — HYDROMORPHONE HCL 1 MG/ML IJ SOLN: 0.2500 mg | INTRAMUSCULAR | Status: DC | PRN

## 2019-09-29 MED ORDER — LIDOCAINE HCL (PF) 2 % IJ SOLN
INTRAMUSCULAR | Status: AC
  Filled 2019-09-29: qty 5

## 2019-09-29 MED ORDER — MAGNESIUM HYDROXIDE 400 MG/5ML PO SUSP: 30.0000 mL | Freq: Every day | ORAL | Status: DC | PRN

## 2019-09-29 MED ORDER — SODIUM CHLORIDE 0.9 % IV SOLN: INTRAVENOUS | Status: DC

## 2019-09-29 MED ORDER — PROPOFOL 500 MG/50ML IV EMUL
INTRAVENOUS | Status: DC | PRN
  Administered 2019-09-29: 75 ug/kg/min via INTRAVENOUS

## 2019-09-29 MED ORDER — CEFAZOLIN SODIUM-DEXTROSE 2-4 GM/100ML-% IV SOLN
2.0000 g | INTRAVENOUS | Status: AC
  Administered 2019-09-29: 2 g via INTRAVENOUS

## 2019-09-29 MED ORDER — PANTOPRAZOLE SODIUM 40 MG PO TBEC
40.0000 mg | DELAYED_RELEASE_TABLET | Freq: Every day | ORAL | Status: DC
  Administered 2019-09-30 – 2019-10-01 (×2): 40 mg via ORAL
  Filled 2019-09-29 (×2): qty 1

## 2019-09-29 MED ORDER — LOSARTAN POTASSIUM 50 MG PO TABS
50.0000 mg | ORAL_TABLET | ORAL | Status: DC
  Administered 2019-09-29 – 2019-10-01 (×3): 50 mg via ORAL
  Filled 2019-09-29 (×3): qty 1

## 2019-09-29 MED ORDER — TRANEXAMIC ACID 1000 MG/10ML IV SOLN
INTRAVENOUS | Status: DC | PRN
  Administered 2019-09-29: 1000 mg via TOPICAL

## 2019-09-29 MED ORDER — BUPIVACAINE LIPOSOME 1.3 % IJ SUSP
INTRAMUSCULAR | Status: AC
  Filled 2019-09-29: qty 20

## 2019-09-29 MED ORDER — ACETAMINOPHEN 500 MG PO TABS
1000.0000 mg | ORAL_TABLET | Freq: Four times a day (QID) | ORAL | Status: AC
  Administered 2019-09-29 – 2019-09-30 (×3): 1000 mg via ORAL
  Filled 2019-09-29 (×6): qty 2

## 2019-09-29 MED ORDER — ACETAMINOPHEN 325 MG PO TABS: 325.0000 mg | ORAL_TABLET | Freq: Four times a day (QID) | ORAL | Status: DC | PRN

## 2019-09-29 MED ORDER — FENTANYL CITRATE (PF) 100 MCG/2ML IJ SOLN
INTRAMUSCULAR | Status: AC
  Filled 2019-09-29: qty 2

## 2019-09-29 MED ORDER — ACETAMINOPHEN 10 MG/ML IV SOLN
INTRAVENOUS | Status: AC
  Filled 2019-09-29: qty 100

## 2019-09-29 MED ORDER — BUPIVACAINE HCL (PF) 0.5 % IJ SOLN
INTRAMUSCULAR | Status: AC
  Filled 2019-09-29: qty 30

## 2019-09-29 MED ORDER — SODIUM CHLORIDE 0.9 % IV SOLN
INTRAVENOUS | Status: DC | PRN
  Administered 2019-09-29: 25 ug/min via INTRAVENOUS

## 2019-09-29 MED ORDER — TRAMADOL HCL 50 MG PO TABS: 50.0000 mg | ORAL_TABLET | Freq: Four times a day (QID) | ORAL | Status: DC | PRN

## 2019-09-29 MED ORDER — SODIUM CHLORIDE FLUSH 0.9 % IV SOLN
INTRAVENOUS | Status: AC
  Filled 2019-09-29: qty 40

## 2019-09-29 MED ORDER — SUCCINYLCHOLINE CHLORIDE 200 MG/10ML IV SOSY
PREFILLED_SYRINGE | INTRAVENOUS | Status: AC
  Filled 2019-09-29: qty 10

## 2019-09-29 MED ORDER — ALLOPURINOL 100 MG PO TABS
100.0000 mg | ORAL_TABLET | Freq: Every day | ORAL | Status: DC
  Administered 2019-09-29 – 2019-09-30 (×2): 100 mg via ORAL
  Filled 2019-09-29 (×3): qty 1

## 2019-09-29 MED ORDER — CEFAZOLIN SODIUM-DEXTROSE 2-4 GM/100ML-% IV SOLN
INTRAVENOUS | Status: AC
  Filled 2019-09-29: qty 100

## 2019-09-29 MED ORDER — XYLITOL 500 MG MT DISK: DISK | Freq: Every day | OROMUCOSAL | Status: DC

## 2019-09-29 MED ORDER — GLYCOPYRROLATE 0.2 MG/ML IJ SOLN
INTRAMUSCULAR | Status: AC
  Filled 2019-09-29: qty 5

## 2019-09-29 MED ORDER — ASPIRIN EC 81 MG PO TBEC
81.0000 mg | DELAYED_RELEASE_TABLET | Freq: Every day | ORAL | Status: DC
  Administered 2019-09-30 – 2019-10-01 (×2): 81 mg via ORAL
  Filled 2019-09-29 (×2): qty 1

## 2019-09-29 MED ORDER — XLEAR SINUS CARE SPRAY NA SOLN: Freq: Every day | NASAL | Status: DC

## 2019-09-29 MED ORDER — FENTANYL CITRATE (PF) 100 MCG/2ML IJ SOLN
INTRAMUSCULAR | Status: DC | PRN
  Administered 2019-09-29 (×3): 25 ug via INTRAVENOUS

## 2019-09-29 MED ORDER — BIOTENE DRY MOUTH MT LIQD: 15.0000 mL | OROMUCOSAL | Status: DC | PRN

## 2019-09-29 MED ORDER — ONDANSETRON HCL 4 MG/2ML IJ SOLN
INTRAMUSCULAR | Status: DC | PRN
  Administered 2019-09-29: 4 mg via INTRAVENOUS

## 2019-09-29 MED ORDER — FENTANYL CITRATE (PF) 100 MCG/2ML IJ SOLN: 25.0000 ug | INTRAMUSCULAR | Status: DC | PRN

## 2019-09-29 MED ORDER — FLEET ENEMA 7-19 GM/118ML RE ENEM: 1.0000 | ENEMA | Freq: Once | RECTAL | Status: DC | PRN

## 2019-09-29 MED ORDER — OXYCODONE HCL 5 MG/5ML PO SOLN: 5.0000 mg | Freq: Once | ORAL | Status: DC | PRN

## 2019-09-29 MED ORDER — ENOXAPARIN SODIUM 40 MG/0.4ML ~~LOC~~ SOLN
40.0000 mg | SUBCUTANEOUS | Status: DC
  Administered 2019-09-30 – 2019-10-01 (×2): 40 mg via SUBCUTANEOUS
  Filled 2019-09-29 (×3): qty 0.4

## 2019-09-29 MED ORDER — ADULT MULTIVITAMIN W/MINERALS CH
ORAL_TABLET | Freq: Every day | ORAL | Status: DC
  Administered 2019-09-30 – 2019-10-01 (×2): 1 via ORAL
  Filled 2019-09-29 (×2): qty 1

## 2019-09-29 MED ORDER — DOCUSATE SODIUM 100 MG PO CAPS
100.0000 mg | ORAL_CAPSULE | Freq: Every day | ORAL | Status: DC
  Administered 2019-09-30: 100 mg via ORAL
  Filled 2019-09-29 (×2): qty 1

## 2019-09-29 MED ORDER — DIPHENHYDRAMINE HCL 12.5 MG/5ML PO ELIX: 12.5000 mg | ORAL_SOLUTION | ORAL | Status: DC | PRN

## 2019-09-29 MED ORDER — ONDANSETRON HCL 4 MG/2ML IJ SOLN: 4.0000 mg | Freq: Four times a day (QID) | INTRAMUSCULAR | Status: DC | PRN

## 2019-09-29 MED ORDER — SODIUM CHLORIDE 0.9 % IV SOLN
INTRAVENOUS | Status: DC | PRN
  Administered 2019-09-29: 60 mL

## 2019-09-29 MED ORDER — SALINE SPRAY 0.65 % NA SOLN
1.0000 | Freq: Every day | NASAL | Status: DC
  Administered 2019-10-01: 1 via NASAL
  Filled 2019-09-29: qty 44

## 2019-09-29 MED ORDER — BUPIVACAINE-EPINEPHRINE (PF) 0.5% -1:200000 IJ SOLN
INTRAMUSCULAR | Status: DC | PRN
  Administered 2019-09-29: 30 mL

## 2019-09-29 MED ORDER — GLYCOPYRROLATE 0.2 MG/ML IJ SOLN
INTRAMUSCULAR | Status: DC | PRN
Start: 2019-09-29 — End: 2019-09-29
  Administered 2019-09-29: .2 mg via INTRAVENOUS

## 2019-09-29 MED ORDER — ONDANSETRON HCL 4 MG/2ML IJ SOLN
INTRAMUSCULAR | Status: AC
  Filled 2019-09-29: qty 4

## 2019-09-29 MED ORDER — PROPOFOL 10 MG/ML IV BOLUS
INTRAVENOUS | Status: AC
  Filled 2019-09-29: qty 20

## 2019-09-29 MED ORDER — BISACODYL 10 MG RE SUPP: 10.0000 mg | Freq: Every day | RECTAL | Status: DC | PRN

## 2019-09-29 MED ORDER — AMLODIPINE BESYLATE 5 MG PO TABS
5.0000 mg | ORAL_TABLET | Freq: Every day | ORAL | Status: DC
  Administered 2019-09-29 – 2019-09-30 (×2): 5 mg via ORAL
  Filled 2019-09-29 (×2): qty 1

## 2019-09-29 MED ORDER — ONDANSETRON HCL 4 MG PO TABS: 4.0000 mg | ORAL_TABLET | Freq: Four times a day (QID) | ORAL | Status: DC | PRN

## 2019-09-29 MED ORDER — LACTATED RINGERS IV SOLN: INTRAVENOUS | Status: DC

## 2019-09-29 MED ORDER — EPINEPHRINE PF 1 MG/ML IJ SOLN
INTRAMUSCULAR | Status: AC
  Filled 2019-09-29: qty 1

## 2019-09-29 MED ORDER — LORATADINE 10 MG PO TABS
10.0000 mg | ORAL_TABLET | Freq: Every day | ORAL | Status: DC
  Administered 2019-09-30 – 2019-10-01 (×2): 10 mg via ORAL
  Filled 2019-09-29 (×2): qty 1

## 2019-09-29 SURGICAL SUPPLY — 65 items
APL PRP STRL LF DISP 70% ISPRP (MISCELLANEOUS) ×1
BLADE SAW SAG 25X90X1.19 (BLADE) ×2 IMPLANT
BLADE SURG SZ20 CARB STEEL (BLADE) ×2 IMPLANT
BNDG CMPR STD VLCR NS LF 5.8X6 (GAUZE/BANDAGES/DRESSINGS) ×1
BNDG ELASTIC 6X5.8 VLCR NS LF (GAUZE/BANDAGES/DRESSINGS) ×2 IMPLANT
BRNG TIB 83X10 ANT STAB MDLR (Insert) ×1 IMPLANT
CANISTER SUCT 1200ML W/VALVE (MISCELLANEOUS) ×2 IMPLANT
CANISTER SUCT 3000ML PPV (MISCELLANEOUS) ×2 IMPLANT
CEMENT BONE R 1X40 (Cement) ×4 IMPLANT
CEMENT VACUUM MIXING SYSTEM (MISCELLANEOUS) ×2 IMPLANT
CHLORAPREP W/TINT 26 (MISCELLANEOUS) ×2 IMPLANT
COMP FEMORAL CRUC RIGHT 75MM (Joint) ×2 IMPLANT
COMPONENT FEMRL CRUC RT 75MM (Joint) IMPLANT
COOLER POLAR GLACIER W/PUMP (MISCELLANEOUS) ×2 IMPLANT
COVER MAYO STAND REUSABLE (DRAPES) ×2 IMPLANT
COVER WAND RF STERILE (DRAPES) ×2 IMPLANT
CUFF TOURN SGL QUICK 24 (TOURNIQUET CUFF) ×2
CUFF TOURN SGL QUICK 30 (TOURNIQUET CUFF)
CUFF TRNQT CYL 24X4X16.5-23 (TOURNIQUET CUFF) IMPLANT
CUFF TRNQT CYL 30X4X21-28X (TOURNIQUET CUFF) IMPLANT
DRAPE 3/4 80X56 (DRAPES) ×2 IMPLANT
DRAPE IMP U-DRAPE 54X76 (DRAPES) ×2 IMPLANT
DRSG OPSITE POSTOP 4X10 (GAUZE/BANDAGES/DRESSINGS) ×2 IMPLANT
DRSG OPSITE POSTOP 4X8 (GAUZE/BANDAGES/DRESSINGS) ×2 IMPLANT
ELECT CAUTERY BLADE 6.4 (BLADE) ×2 IMPLANT
ELECT REM PT RETURN 9FT ADLT (ELECTROSURGICAL) ×2
ELECTRODE REM PT RTRN 9FT ADLT (ELECTROSURGICAL) ×1 IMPLANT
GLOVE BIO SURGEON STRL SZ7.5 (GLOVE) ×8 IMPLANT
GLOVE BIO SURGEON STRL SZ8 (GLOVE) ×8 IMPLANT
GLOVE BIOGEL PI IND STRL 8 (GLOVE) ×1 IMPLANT
GLOVE BIOGEL PI INDICATOR 8 (GLOVE) ×1
GLOVE INDICATOR 8.0 STRL GRN (GLOVE) ×2 IMPLANT
GOWN STRL REUS W/ TWL LRG LVL3 (GOWN DISPOSABLE) ×1 IMPLANT
GOWN STRL REUS W/ TWL XL LVL3 (GOWN DISPOSABLE) ×1 IMPLANT
GOWN STRL REUS W/TWL LRG LVL3 (GOWN DISPOSABLE) ×4
GOWN STRL REUS W/TWL XL LVL3 (GOWN DISPOSABLE) ×2
HOLDER FOLEY CATH W/STRAP (MISCELLANEOUS) IMPLANT
HOOD PEEL AWAY FLYTE STAYCOOL (MISCELLANEOUS) ×6 IMPLANT
INSERT TIB BEARING 83X10 (Insert) ×1 IMPLANT
KIT TURNOVER KIT A (KITS) ×2 IMPLANT
NDL SAFETY ECLIPSE 18X1.5 (NEEDLE) ×2 IMPLANT
NDL SPNL 20GX3.5 QUINCKE YW (NEEDLE) ×1 IMPLANT
NEEDLE HYPO 18GX1.5 SHARP (NEEDLE) ×4
NEEDLE SPNL 20GX3.5 QUINCKE YW (NEEDLE) ×2 IMPLANT
NS IRRIG 1000ML POUR BTL (IV SOLUTION) ×2 IMPLANT
PACK TOTAL KNEE (MISCELLANEOUS) ×2 IMPLANT
PAD WRAPON POLAR KNEE (MISCELLANEOUS) ×1 IMPLANT
PEG PATELLA SERIES A 37MMX10MM (Orthopedic Implant) ×1 IMPLANT
PLATE TIBIAL INTERLOK 83 (Plate) ×1 IMPLANT
PULSAVAC PLUS IRRIG FAN TIP (DISPOSABLE) ×2
SOL .9 NS 3000ML IRR  AL (IV SOLUTION) ×2
SOL .9 NS 3000ML IRR AL (IV SOLUTION) ×1
SOL .9 NS 3000ML IRR UROMATIC (IV SOLUTION) ×1 IMPLANT
STAPLER SKIN PROX 35W (STAPLE) ×2 IMPLANT
SUCTION FRAZIER HANDLE 10FR (MISCELLANEOUS) ×2
SUCTION TUBE FRAZIER 10FR DISP (MISCELLANEOUS) ×1 IMPLANT
SUT VIC AB 0 CT1 36 (SUTURE) ×6 IMPLANT
SUT VIC AB 2-0 CT1 27 (SUTURE) ×6
SUT VIC AB 2-0 CT1 TAPERPNT 27 (SUTURE) ×3 IMPLANT
SYR 10ML LL (SYRINGE) ×2 IMPLANT
SYR 20ML LL LF (SYRINGE) ×2 IMPLANT
SYR 30ML LL (SYRINGE) ×6 IMPLANT
TIP FAN IRRIG PULSAVAC PLUS (DISPOSABLE) ×1 IMPLANT
TRAY FOLEY MTR SLVR 16FR STAT (SET/KITS/TRAYS/PACK) IMPLANT
WRAPON POLAR PAD KNEE (MISCELLANEOUS) ×2

## 2019-09-29 NOTE — Op Note (Signed)
09/29/2019  9:59 AM  Patient:   Michael Holloway  Pre-Op Diagnosis:   Degenerative joint disease, right knee.  Post-Op Diagnosis:   Same  Procedure:   Right TKA using all-cemented [employer] Vanguard system with a 75 mm PCR femur, an 83 mm tibial tray with a 10 mm anterior stabilized E-poly insert, and a 37 x 10 mm all-poly 3-pegged domed patella.  Surgeon:   Pascal Lux, MD  Assistant:   Cameron Proud, PA-C   Anesthesia:   Spinal  Findings:   As above  Complications:   None  EBL:   10 cc  Fluids:   1000 cc crystalloid  UOP:   None  TT:   90 minutes at 300 mmHg  Drains:   None  Closure:   Staples  Implants:   As above  Brief Clinical Note:   The patient is an 86 year old male with a long history of progressively worsening right knee pain. The patient's symptoms have progressed despite medications, activity modification, injections, etc. The patient's history and examination were consistent with advanced degenerative joint disease of the right knee confirmed by plain radiographs. The patient presents at this time for a right total knee arthroplasty.  Procedure:   The patient was brought into the operating room. After adequate spinal anesthesia was obtained, the patient was lain in the supine position. A Foley catheter was placed by the nurse before the right lower extremity was prepped with ChloraPrep solution and draped sterilely. Preoperative antibiotics were administered. After verifying the proper laterality with a surgical timeout, the limb was exsanguinated with an Esmarch and the tourniquet inflated to 300 mmHg. A standard anterior approach to the knee was made through an approximately 7 inch incision. The incision was carried down through the subcutaneous tissues to expose superficial retinaculum. This was split the length of the incision and the medial flap elevated sufficiently to expose the medial retinaculum. The medial retinaculum was incised, leaving a 3-4 mm cuff of  tissue on the patella. This was extended distally along the medial border of the patellar tendon and proximally through the medial third of the quadriceps tendon. A subtotal fat pad excision was performed before the soft tissues were elevated off the anteromedial and anterolateral aspects of the proximal tibia to the level of the collateral ligaments. The anterior portions of the medial and lateral menisci were removed, as was the anterior cruciate ligament. With the knee flexed to 90, the external tibial guide was positioned and the appropriate proximal tibial cut made. This piece was taken to the back table where it was measured and found to be optimally replicated by an 83 mm component.  Attention was directed to the distal femur. The intramedullary canal was accessed through a 3/8" drill hole. The intramedullary guide was inserted and positioned in order to obtain a neutral flexion gap. The intercondylar block was positioned with care taken to avoid notching the anterior cortex of the femur. The appropriate cut was made. Next, the distal cutting block was placed at 5 of valgus alignment. Using the 9 mm slot, the distal cut was made. The distal femur was measured and found to be optimally replicated by the 75 mm component. The 75 mm 4-in-1 cutting block was positioned and first the posterior, then the posterior chamfer, the anterior chamfer, and finally the anterior cuts were made. At this point, the posterior portions medial and lateral menisci were removed. A trial reduction was performed using the appropriate femoral and tibial components with the  10 mm insert. This demonstrated excellent stability to varus and valgus stressing both in flexion and extension while permitting full extension. Patella tracking was assessed and found to be excellent. Therefore, the tibial guide position was marked on the proximal tibia. The patella thickness was measured and found to be 25 mm. Therefore, the appropriate cut was  made. The patellar surface was measured and found to be optimally replicated by the 37 mm component. The three peg holes were drilled in place before the trial button was inserted. Patella tracking was assessed and found to be excellent, passing the "no thumb test". The lug holes were drilled into the distal femur before the trial component was removed, leaving only the tibial tray. The keel was then created using the appropriate tower, reamer, and punch.  The bony surfaces were prepared for cementing by irrigating them thoroughly with bacitracin saline solution via the jet lavage system. A bone plug was fashioned from some of the bone that had been removed previously and used to plug the distal femoral canal. In addition, 20 cc of Exparel diluted out to 60 cc with normal saline and 30 cc of 0.5% Sensorcaine were injected into the postero-medial and postero-lateral aspects of the knee, the medial and lateral gutter regions, and the peri-incisional tissues to help with postoperative analgesia. Meanwhile, the cement was being mixed on the back table. When it was ready, the tibial tray was cemented in first. The excess cement was removed using [employer] fast streamer. Next, the femoral component was impacted into place. Again, the excess cement was removed using [employer] fast streamer. The 10 mm trial insert was positioned and the knee brought into extension while the cement hardened. Finally, the patella was cemented into place and secured using the patellar clamp. Again, the excess cement was removed using [employer] fast streamer. Once the cement had hardened, the knee was placed through a range of motion with the findings as described above. Therefore, the trial insert was removed and, after verifying that no cement had been retained posteriorly, the permanent 10 mm anterior stabilized E-polyethylene insert was positioned and secured using the appropriate key locking mechanism. Again the knee was placed through a range of motion with  the findings as described above.  The wound was copiously irrigated with sterile saline solution using the jet lavage system before the quadriceps tendon and retinacular layer were reapproximated using #0 Vicryl interrupted sutures. The superficial retinacular layer also was closed using a running #0 Vicryl suture. A total of 10 cc of transexemic acid (TXA) was injected intra-articularly before the subcutaneous tissues were closed in several layers using 2-0 Vicryl interrupted sutures. The skin was closed using staples. A sterile honeycomb dressing was applied to the skin before the leg was wrapped with an Ace wrap to accommodate the Polar Care device. The patient was then awakened and returned to the recovery room in satisfactory condition after tolerating the procedure well.

## 2019-09-29 NOTE — Anesthesia Preprocedure Evaluation (Signed)
Anesthesia Evaluation  Patient identified by MRN, date of birth, ID band Patient awake    Reviewed: Allergy & Precautions, H&P , NPO status , Patient's Chart, lab work & pertinent test results  History of Anesthesia Complications Negative for: history of anesthetic complications  Airway Mallampati: III  TM Distance: >3 FB Neck ROM: limited    Dental  (+) Chipped, Poor Dentition   Pulmonary neg pulmonary ROS, neg shortness of breath, former smoker,    Pulmonary exam normal        Cardiovascular Exercise Tolerance: Good hypertension, (-) Past MI and (-) DOE Normal cardiovascular exam+ dysrhythmias + pacemaker      Neuro/Psych  Neuromuscular disease negative psych ROS   GI/Hepatic negative GI ROS, Neg liver ROS, GERD  Controlled and Medicated,  Endo/Other  negative endocrine ROS  Renal/GU CRFRenal disease     Musculoskeletal  (+) Arthritis ,   Abdominal   Peds  Hematology negative hematology ROS (+)   Anesthesia Other Findings Past Medical History: No date: Arthritis No date: Cancer (HCC)     Comment:  Basal Cell Skin Cancer No date: Chronic kidney disease     Comment:  stage 111 No date: Dysrhythmia No date: GERD (gastroesophageal reflux disease) No date: Gout No date: History of kidney stones No date: Hypertension No date: Neuropathy No date: Spinal stenosis  Past Surgical History: No date: BACK SURGERY No date: CERVICAL SPINE SURGERY No date: CHOLECYSTECTOMY 08/25/2016: CYSTOSCOPY W/ RETROGRADES; Bilateral     Comment:  Procedure: CYSTOSCOPY WITH RETROGRADE PYELOGRAM;                Surgeon: Hollice Espy, MD;  Location: [facility];                Service: Urology;  Laterality: Bilateral; 07/18/2019: CYSTOSCOPY W/ RETROGRADES; Bilateral     Comment:  Procedure: CYSTOSCOPY WITH RETROGRADE PYELOGRAM;                Surgeon: Hollice Espy, MD;  Location: [facility];                Service: Urology;   Laterality: Bilateral; No date: EYE SURGERY; Bilateral     Comment:  Cataract Extraction with IOL 06/03/2017: LOOP RECORDER INSERTION; N/A     Comment:  Procedure: LOOP RECORDER INSERTION;  Surgeon: Isaias Cowman, MD;  Location: [place] [facility];  Service:              Cardiovascular;  Laterality: N/A; No date: LUMBAR LAMINECTOMY 08/03/2018: PACEMAKER INSERTION; Left     Comment:  Procedure: INSERTION PACEMAKER DUALCHAMBER;  Surgeon:               Isaias Cowman, MD;  Location: [facility];  Service:               Cardiovascular;  Laterality: Left; 1990s: PROSTATE SURGERY     Comment:  Dr. Eliberto Ivory ,in office procedure No date: TONSILLECTOMY 08/25/2016: TRANSURETHRAL RESECTION OF BLADDER TUMOR WITH MITOMYCIN-C;  N/A     Comment:  Procedure: TRANSURETHRAL RESECTION OF BLADDER TUMOR WITH              MITOMYCIN-C;  Surgeon: Hollice Espy, MD;  Location:               [facility];  Service: Urology;  Laterality: N/A; 07/18/2019: TRANSURETHRAL RESECTION OF BLADDER TUMOR WITH MITOMYCIN-C;  N/A     Comment:  Procedure:  TRANSURETHRAL RESECTION OF BLADDER TUMOR WITH              gemcitabine;  Surgeon: Hollice Espy, MD;  Location:               [facility];  Service: Urology;  Laterality: N/A; 06/14/2018: TRANSURETHRAL RESECTION OF PROSTATE; N/A     Comment:  Procedure: TRANSURETHRAL RESECTION OF THE PROSTATE               (TURP) with Gemcitabine;  Surgeon: Hollice Espy, MD;                Location: [facility];  Service: Urology;  Laterality: N/A;  BMI    Body Mass Index: 27.02 kg/m      Reproductive/Obstetrics negative OB ROS                             Anesthesia Physical Anesthesia Plan  ASA: III  Anesthesia Plan: Spinal   Post-op Pain Management:    Induction:   PONV Risk Score and Plan:   Airway Management Planned: Natural Airway and Nasal Cannula  Additional Equipment:   Intra-op Plan:   Post-operative Plan:   Informed  Consent: I have reviewed the patients History and Physical, chart, labs and discussed the procedure including the risks, benefits and alternatives for the proposed anesthesia with the patient or authorized representative who has indicated his/her understanding and acceptance.     Dental Advisory Given  Plan Discussed with: Anesthesiologist, CRNA and Surgeon  Anesthesia Plan Comments: (Patient reports no bleeding problems and no anticoagulant use.  Plan for spinal with backup GA  Patient consented for risks of anesthesia including but not limited to:  - adverse reactions to medications - damage to eyes, teeth, lips or other oral mucosa - nerve damage due to positioning  - risk of bleeding, infection, nerve damage and headache - risk of failed spinal - damage to teeth, lips or other oral mucosa - sore throat or hoarseness - damage to heart, brain, nerves, lungs or loss of life  Patient voiced understanding.)        Anesthesia Quick Evaluation

## 2019-09-29 NOTE — H&P (Signed)
History of Present Illness: Michael Holloway is a 86 y.o. male who presents today for his surgical history and physical for upcoming right total knee arthroplasty. Surgery was scheduled with Dr. Roland Rack on 09/29/2019. The patient denies any personal history of heart attack, stroke, asthma or COPD. No personal history of blood clots for the patient. The patient does have a pacemaker and does take a baby aspirin routinely, he has stopped taking his 81 mg aspirin at this time. The patient denies any trauma or injury since he was last evaluated. Pain score today is a 2 out of 10 at today's appointment. The patient denies any numbness or tingling to the right lower extremity.  Past Medical History: . Allergic state  . Barrett's esophagus 2001  . BPH (benign prostatic hyperplasia)  s/p TUNA procedure  . 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 Carotid artery occlusion 2013  Partial Blockage left carotid artery  . Cataract cortical, senile 2002  Lens emplant  . Chronic renal insufficiency  . 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 GERD (gastroesophageal reflux disease)  . Gout  . Hyperlipidemia  . Hypertension  . Kidney disease 2003  . Macular degeneration  . Neuropathy of left lower extremity  . Other allergic rhinitis  . Pacemaker   Past Surgical History: . CATARACT EXTRACTION 2004  . CHOLECYSTECTOMY 1985  . COLONOSCOPY 09/18/1997  Normal Colon  . COLONOSCOPY 04/18/2002  Adenomatous Polyps  . COLONOSCOPY 09/08/2005  PH Adenomatous Polyps: CBF 08/2011; No repeat due to age per RTE (dw)  . EGD 09/18/1997  H Pylori  . EGD 12/29/2011, 11/02/2008, 09/08/2005, 10/26/2000  Barrett's Esophagus: CBF 12/2014; No repeat due to age per RTE (dw)  . JOINT REPLACEMENT 2008  Knee left  . KNEE ARTHROSCOPY 2001  Left knee  . LAMINECTOMY LUMBAR SPINE  x 4 in 1988, 1993, 1999, 2005  . LENS EYE SURGERY Bilateral  pc iol ou  . POSTERIOR LAMINECTOMY / DECOMPRESSION CERVICAL SPINE  07/03/2004  . [place]  . REPLACEMENT TOTAL KNEE Left 2008  . TONSILLECTOMY 1945  . TUNA procedure   Past Family History: . Lung cancer Mother  . Obesity Mother  . Stroke Father  . Macular degeneration Cousin  . Diabetes type II Maternal Aunt  . Diabetes type I Daughter  . Diabetes type II Daughter  . Glaucoma Neg Hx  . Blindness Neg Hx  . High blood pressure (Hypertension) Neg Hx  . Cataracts Neg Hx  . Thyroid disease Neg Hx  . Vision loss Neg Hx   Medications: . allopurinoL (ZYLOPRIM) 100 MG tablet TAKE ONE TABLET BY MOUTH DAILY 90 tablet 1  . amLODIPine (NORVASC) 10 MG tablet Take 0.5 tablets (5 mg total) by mouth once daily 90 tablet 3  . aspirin 81 MG EC tablet Take 81 mg by mouth once daily  . biotin 10,000 mcg Cap Take by mouth  . cetirizine (ZYRTEC) 10 MG tablet Take by mouth Take 10 mg by mouth every morning.  Marland Kitchen FOLIC ACID/MULTIVIT-MIN/LUTEIN (CENTRUM SILVER ORAL) Take 1 tablet by mouth once daily.  Marland Kitchen gabapentin (NEURONTIN) 600 MG tablet TAKE ONE TABLET BY MOUTH THREE TIMES A DAY 270 tablet 1  . gentamicin (GARAMYCIN) 0.3 % (3 mg/gram) ophthalmic ointment 0.5 inches 2 (two) times daily  . losartan (COZAAR) 50 MG tablet TAKE ONE TABLET BY MOUTH DAILY 90 tablet 1  . omeprazole (PRILOSEC) 20 MG DR capsule TAKE ONE CAPSULE BY MOUTH DAILY AS NEEDED  90 capsule 1  . traMADoL (ULTRAM) 50 mg tablet TAKE ONE TABLET BY MOUTH EVERY 8 HOUR AS NEEDED FOR PAIN 90 tablet 0  . UNABLE TO FIND Nervogen- 1 tablet in the morning and at night  . vit C/E/Zn/coppr/lutein/zeaxan (PRESERVISION AREDS-2 ORAL) Take 1 capsule by mouth 2 (two) times daily  . XYLITOL, BULK, MISC Use Use as directed 1 tablet in the mouth or throat at bedtime.   No current Epic-ordered facility-administered medications on file.   Allergies:  . Sulfa (Sulfonamide Antibiotics) Rash   Review of Systems:  A comprehensive 14 point ROS was performed, reviewed by me today, and the pertinent orthopaedic  findings are documented in the HPI.  Physical Exam: BP 110/60  Ht 193 cm (6\' 4" )  Wt 100.7 kg (222 lb)  BMI 27.02 kg/m  General/Constitutional: The patient appears to be well-nourished, well-developed, and in no acute distress. Neuro/Psych: Normal mood and affect, oriented to person, place and time. Eyes: Non-icteric. Pupils are equal, round, and reactive to light, and exhibit synchronous movement. ENT: Unremarkable. Lymphatic: No palpable adenopathy. Respiratory: Lungs clear to auscultation, Normal chest excursion, No wheezes and Non-labored breathing Cardiovascular: Regular rate and rhythm. No murmurs. and No edema, swelling or tenderness, except as noted in detailed exam. Integumentary: No impressive skin lesions present, except as noted in detailed exam. Musculoskeletal: Unremarkable, except as noted in detailed exam.  Right knee exam: GAIT: mild limp and uses a cane. ALIGNMENT: normal SKIN: unremarkable SWELLING: mild EFFUSION: trace WARMTH: no warmth TENDERNESS: Minimal tenderness over the lateral joint line and medial joint line ROM: 5-125 degrees with mild discomfort in maximal flexion. McMURRAY'S: negative PATELLOFEMORAL: normal tracking with no peri-patellar tenderness and negative apprehension sign CREPITUS: no LACHMAN'S: trace positive PIVOT SHIFT: Not evaluated ANTERIOR DRAWER: negative POSTERIOR DRAWER: negative VARUS/VALGUS: stable  He is neurovascularly intact to the right lower extremity and foot.  Knee Imaging: AP weightbearing of both knees, as well as lateral and merchant views of the right knee are available for review. These films demonstrate moderate-severe degenerative changes, primarily involving the medial compartment with 90% medial joint space narrowing. Lesser degenerative changes of the lateral and patellofemoral compartments also noted. He has significant chondral calcinosis involving the medial and lateral menisci. Overall alignment is mild  varus. No fractures or lytic lesions are noted.  Impression: Primary osteoarthritis of right knee  Plan:  1. Treatment options were discussed today with the patient. 2. The patient is scheduled for a right total knee arthroplasty with Dr. Roland Rack on 09/29/2019. 3. The patient was instructed on the risk and benefits of the procedure and wishes to proceed at this time. 4. This document will serve as a surgical history and physical for the patient. 5. The patient will follow-up per standard postop protocol. They can call the clinic they have any questions, new symptoms develop or symptoms worsen.  The procedure was discussed with the patient, as were the potential risks (including bleeding, infection, nerve and/or blood vessel injury, persistent or recurrent pain, failure of the hardware, dislocation, polywear, need for further surgery, blood clots, strokes, heart attacks and/or arhythmias, pneumonia, etc.) and benefits. The patient states his understanding and wishes to proceed.   H&P reviewed and patient re-examined. No changes.

## 2019-09-29 NOTE — Anesthesia Procedure Notes (Signed)
Spinal  Patient location during procedure: OR Start time: 09/29/2019 7:30 AM End time: 09/29/2019 7:35 AM Staffing Performed: anesthesiologist  Anesthesiologist: Mikko Lewellen, Precious Haws, MD Preanesthetic Checklist Completed: patient identified, IV checked, site marked, risks and benefits discussed, surgical consent, monitors and equipment checked, pre-op evaluation and timeout performed Spinal Block Patient position: sitting Prep: ChloraPrep Patient monitoring: heart rate, continuous pulse ox, blood pressure and cardiac monitor Approach: midline Location: L3-4 Injection technique: single-shot Needle Needle type: Whitacre and Introducer  Needle gauge: 24 G Needle length: 9 cm Assessment Sensory level: T10 Additional Notes Negative paresthesia. Negative blood return. Positive free-flowing CSF. Expiration date of kit checked and confirmed. Patient tolerated procedure well, without complications.

## 2019-09-29 NOTE — TOC Initial Note (Signed)
Transition of Care [facility]) - Initial/Assessment Note    Patient Details  Name: Michael Holloway MRN: [id number] Date of Birth: [birth year]  Transition of Care [facility]) CM/SW Contact:    Elease Hashimoto, LCSW Phone Number: 09/29/2019, 2:07 PM  Clinical Narrative:    Met with pt who is back from surgery and glad for it to be over. His son lives with him and can assist if needed. Pt was independent with cane prior to admission and also has a rw if needed. He is aware [facility] is set up to see him at DC. He hopes when PT comes today he will do well. Will continue to work on discharge needs. See tomorrow to see who he is doing.               Expected Discharge Plan: [place] Barriers to Discharge: Continued Medical Work up   Patient Goals and CMS Choice Patient states their goals for this hospitalization and ongoing recovery are:: I am glad the surgery is over. I am ready to recover from this CMS Medicare.gov Compare Post Acute Care list provided to:: Patient Choice offered to / list presented to : Patient  Expected Discharge Plan and Services Expected Discharge Plan: [place] In-house Referral: Clinical Social Work   Post Acute Care Choice: [place] arrangements for the past 2 months: [place]: PT [place]: [facility] (now [facility]) Date [place]: 09/29/19 Time [place]: 1406 Representative spoke with at [place]: teresa  Prior Living Arrangements/Services Living arrangements for the past 2 months: [place] with:: Adult Children(Son) Patient language and need for interpreter reviewed:: No Do you feel safe going back to the place where you live?: Yes      Need for Family Participation in Patient Care: Yes (Comment) Care giver support system in place?: Yes (comment) Current home services: DME(rw and cane) Criminal Activity/Legal  Involvement Pertinent to Current Situation/Hospitalization: No - Comment as needed  Activities of Daily Living Home Assistive Devices/Equipment: Cane (specify quad or straight)(straight) ADL Screening (condition at time of admission) Patient's cognitive ability adequate to safely complete daily activities?: Yes Is the patient deaf or have difficulty hearing?: No Does the patient have difficulty seeing, even when wearing glasses/contacts?: No Does the patient have difficulty concentrating, remembering, or making decisions?: No Patient able to express need for assistance with ADLs?: Yes Does the patient have difficulty dressing or bathing?: No Independently performs ADLs?: Yes (appropriate for developmental age) Does the patient have difficulty walking or climbing stairs?: Yes Weakness of Legs: Both Weakness of Arms/Hands: None  Permission Sought/Granted Permission sought to share information with : Facility Sport and exercise psychologist, Family Supports Permission granted to share information with : Yes, Verbal Permission Granted  Share Information with NAME: Hanz  Permission granted to share info w AGENCY: [facility]  Permission granted to share info w Relationship: son  Permission granted to share info w Contact Information: teresa  Emotional Assessment Appearance:: Appears stated age Attitude/Demeanor/Rapport: Gracious Affect (typically observed): Adaptable, Accepting Orientation: : Oriented to Self, Oriented to Place, Oriented to  Time, Oriented to Situation Alcohol / Substance Use: Never Used Psych Involvement: No (comment)  Admission diagnosis:  Status post total knee replacement using cement, right [Z96.651] Patient Active Problem List  Diagnosis Date Noted  . Status post total knee replacement using cement, right 09/29/2019  . Ambulates with cane 12/23/2018  . Elevated hemoglobin A1c 12/23/2018  . Spinal stenosis of lumbar region with neurogenic claudication 12/23/2018  . S/P  placement of cardiac pacemaker 08/11/2018  . Sinus pause 07/07/2018  . Hyperlipidemia 06/10/2017  . Status post placement of implantable loop recorder 06/10/2017  . Bladder tumor 03/31/2017  . Carotid stenosis 01/07/2017  . Gross hematuria 07/23/2016  . Lumbar radiculitis 06/10/2016  . Near syncope 04/24/2016  . Bigeminal rhythm 04/24/2016  . Paresthesia of bilateral legs 03/07/2016  . Chronic lumbar radiculopathy 03/07/2016  . Essential hypertension 09/03/2015  . Age-related macular degeneration, dry, right eye 06/07/2015  . Exudative age-related macular degeneration, left eye, with active choroidal neovascularization ([place]) 04/02/2015  . Degenerative disc disease, cervical 03/03/2015  . CKD (chronic kidney disease) stage 3, GFR 30-59 ml/min (HCC) 09/28/2014  . 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 Other allergic rhinitis 09/28/2014  . History of recent fall 09/28/2014  . Gastroesophageal reflux disease without esophagitis 09/28/2014  . Chronic neck pain 09/28/2014  . Benign essential hypertension 09/28/2014   PCP:  Kirk Ruths, MD Pharmacy:   [facility] [place], [place] [address] Phone: [phone] Fax: [phone]     Social Determinants of Health (SDOH) Interventions    Readmission Risk Interventions No flowsheet data found.

## 2019-09-29 NOTE — Transfer of Care (Signed)
Immediate Anesthesia Transfer of Care Note  Patient: Michael Holloway  Procedure(s) Performed: TOTAL KNEE ARTHROPLASTY (Right Knee)  Patient Location: PACU  Anesthesia Type:General  Level of Consciousness: awake, drowsy and patient cooperative  Airway & Oxygen Therapy: Patient Spontanous Breathing  Post-op Assessment: Report given to RN and Post -op Vital signs reviewed and stable  Post vital signs: Reviewed and stable  Last Vitals:  Vitals Value Taken Time  BP 120/65 09/29/19 0953  Temp    Pulse 66 09/29/19 0955  Resp 16 09/29/19 0955  SpO2 99 % 09/29/19 0955  Vitals shown include unvalidated device data.  Last Pain:  Vitals:   09/29/19 0635  TempSrc: Tympanic  PainSc: 0-No pain         Complications: No apparent anesthesia complications

## 2019-09-29 NOTE — Evaluation (Signed)
Physical Therapy Evaluation Patient Details Name: Michael Holloway MRN: [id number] DOB: 30-Jan-1929 Today's Date: 09/29/2019   History of Present Illness  Pt admitted for R TKR. History includes DJD, GERD, HTN, HLD, pacemaker and history of L TKR.  Clinical Impression  Pt is a pleasant 86 year old male who was admitted for R TKR. Pt performs bed mobility, transfers, and ambulation with cga and RW. Pt demonstrates ability to perform 10 SLRs with independence, therefore does not require KI for mobility. Pt demonstrates deficits with strength/ROM/mobility. Would benefit from skilled PT to address above deficits and promote optimal return to PLOF. Recommend transition to [place] upon discharge from acute hospitalization.     Follow Up Recommendations Home health PT    Equipment Recommendations  None recommended by PT    Recommendations for Other Services       Precautions / Restrictions Precautions Precautions: Knee;Fall Precaution Booklet Issued: No Restrictions Weight Bearing Restrictions: Yes RLE Weight Bearing: Weight bearing as tolerated      Mobility  Bed Mobility Overal bed mobility: Needs Assistance Bed Mobility: Supine to Sit     Supine to sit: Min guard     General bed mobility comments: safe technique with upright posture.  Transfers Overall transfer level: Needs assistance Equipment used: Rolling walker (2 wheeled) Transfers: Sit to/from Stand Sit to Stand: Min guard         General transfer comment: safe technique with upright posture with RW use.  Ambulation/Gait Ambulation/Gait assistance: Min guard Gait Distance (Feet): 5 Feet Assistive device: Rolling walker (2 wheeled) Gait Pattern/deviations: Step-to pattern     General Gait Details: ambulated to recliner with step to gait pattern with RW.   Stairs            Wheelchair Mobility    Modified Rankin (Stroke Patients Only)       Balance Overall balance assessment: Needs  assistance Sitting-balance support: Feet supported;Bilateral upper extremity supported Sitting balance-Leahy Scale: Good     Standing balance support: Bilateral upper extremity supported Standing balance-Leahy Scale: Good                               Pertinent Vitals/Pain Pain Assessment: 0-10 Pain Score: 2  Pain Location: R knee Pain Descriptors / Indicators: Operative site guarding Pain Intervention(s): Limited activity within patient's tolerance;Ice applied    Home Living Family/patient expects to be discharged to:: Private residence Living Arrangements: Children(son) Available Help at Discharge: Family Type of Home: House Home Access: Stairs to enter Entrance Stairs-Rails: None(he reaches for door) Entrance Stairs-Number of Steps: 1 step x 2 Home Layout: Two level;Able to live on main level with bedroom/bathroom Home Equipment: Gilford Rile - 2 wheels(hurry-cane)      Prior Function Level of Independence: Independent with assistive device(s)         Comments: very indep, no falls, was using SPC primarily and working out at the [employer]        Extremity/Trunk Assessment   Upper Extremity Assessment Upper Extremity Assessment: Overall WFL for tasks assessed    Lower Extremity Assessment Lower Extremity Assessment: Generalized weakness(R LE grossly 3/5; L LE grossly 4/5)       Communication   Communication: No difficulties  Cognition Arousal/Alertness: Awake/alert Behavior During Therapy: WFL for tasks assessed/performed Overall Cognitive Status: Within Functional Limits for tasks assessed  General Comments      Exercises Total Joint Exercises Goniometric ROM: R knee AAROM: 3-90 degrees Other Exercises Other Exercises: supine ther-ex performed x 10 reps including AP, quad sets, SLRs, hip abd/add, and knee flexion. All ther-ex performed x R knee   Assessment/Plan     PT Assessment Patient needs continued PT services  PT Problem List Decreased strength;Decreased range of motion;Decreased activity tolerance;Decreased mobility;Decreased knowledge of use of DME;Pain       PT Treatment Interventions DME instruction;Gait training;Stair training;Therapeutic exercise;Balance training    PT Goals (Current goals can be found in the Care Plan section)  Acute Rehab PT Goals Patient Stated Goal: to go home PT Goal Formulation: With patient Time For Goal Achievement: 10/13/19 Potential to Achieve Goals: Good    Frequency BID   Barriers to discharge        Co-evaluation               AM-PAC PT "6 Clicks" Mobility  Outcome Measure Help needed turning from your back to your side while in a flat bed without using bedrails?: A Little Help needed moving from lying on your back to sitting on the side of a flat bed without using bedrails?: A Little Help needed moving to and from a bed to a chair (including a wheelchair)?: A Little Help needed standing up from a chair using your arms (e.g., wheelchair or bedside chair)?: A Little Help needed to walk in hospital room?: A Little Help needed climbing 3-5 steps with a railing? : A Lot 6 Click Score: 17    End of Session Equipment Utilized During Treatment: Gait belt Activity Tolerance: Patient tolerated treatment well Patient left: in chair;with chair alarm set;with SCD's reapplied Nurse Communication: Mobility status PT Visit Diagnosis: Muscle weakness (generalized) (M62.81);Difficulty in walking, not elsewhere classified (R26.2);Pain Pain - Right/Left: Right Pain - part of body: Knee    Time: [id number] PT Time Calculation (min) (ACUTE ONLY): 31 min   Charges:   PT Evaluation $PT Eval Moderate Complexity: 1 Mod PT Treatments $Therapeutic Exercise: 8-22 mins        Greggory Stallion, PT, DPT [phone]   Ayen Viviano 09/29/2019, 5:01 PM

## 2019-09-30 LAB — BASIC METABOLIC PANEL
Anion gap: 7 (ref 5–15)
BUN: 24 mg/dL — ABNORMAL HIGH (ref 8–23)
CO2: 26 mmol/L (ref 22–32)
Calcium: 8.4 mg/dL — ABNORMAL LOW (ref 8.9–10.3)
Chloride: 108 mmol/L (ref 98–111)
Creatinine, Ser: 1.66 mg/dL — ABNORMAL HIGH (ref 0.61–1.24)
GFR calc Af Amer: 42 mL/min — ABNORMAL LOW (ref >60)
GFR calc non Af Amer: 36 mL/min — ABNORMAL LOW (ref >60)
Glucose, Bld: 107 mg/dL — ABNORMAL HIGH (ref 70–99)
Potassium: 4.5 mmol/L (ref 3.5–5.1)
Sodium: 141 mmol/L (ref 135–145)

## 2019-09-30 MED ORDER — OXYCODONE HCL 5 MG PO TABS: 5.0000 mg | ORAL_TABLET | ORAL | 0 refills | Status: DC | PRN

## 2019-09-30 MED ORDER — ENOXAPARIN SODIUM 40 MG/0.4ML ~~LOC~~ SOLN: 40.0000 mg | SUBCUTANEOUS | 0 refills | Status: DC

## 2019-09-30 NOTE — Progress Notes (Signed)
Physical Therapy Treatment Patient Details Name: Michael Holloway MRN: [id number] DOB: November 01, 1928 Today's Date: 09/30/2019    History of Present Illness Pt admitted for R TKR. History includes DJD, GERD, HTN, HLD, pacemaker and history of L TKR.    PT Comments    Pt motivated for session.  Participated in exercises as described below.  Excellent knee flexion and able to cross RLE over left to don shoes with ease and minimal grimacing.  He is able stand and walk x 2 laps around small pod with RW and min guard.  Some unsteadiness noted and cues for walker position but no bucking and good step through gait.  Remained in recliner after session.     Follow Up Recommendations  Home health PT     Equipment Recommendations  None recommended by PT    Recommendations for Other Services       Precautions / Restrictions Precautions Precautions: Knee;Fall Precaution Booklet Issued: No Restrictions Weight Bearing Restrictions: Yes RLE Weight Bearing: Weight bearing as tolerated    Mobility  Bed Mobility Overal bed mobility: Needs Assistance Bed Mobility: Supine to Sit     Supine to sit: Supervision     General bed mobility comments: safe technique with upright posture.  Transfers Overall transfer level: Needs assistance Equipment used: Rolling walker (2 wheeled) Transfers: Sit to/from Stand Sit to Stand: Min guard         General transfer comment: safe technique with upright posture with RW use.  Ambulation/Gait Ambulation/Gait assistance: Min guard;Min assist Gait Distance (Feet): 240 Feet Assistive device: Rolling walker (2 wheeled) Gait Pattern/deviations: Step-through pattern Gait velocity: decreased   General Gait Details: step through gait pattern with no buckling or hesitancy noted.  some generaly unsteadiness but no LOB noted   Stairs             Wheelchair Mobility    Modified Rankin (Stroke Patients Only)       Balance Overall balance  assessment: Needs assistance Sitting-balance support: Feet supported;Bilateral upper extremity supported Sitting balance-Leahy Scale: Good     Standing balance support: Bilateral upper extremity supported Standing balance-Leahy Scale: Fair Standing balance comment: pt generally steady but requries +1 at this time                            Cognition Arousal/Alertness: Awake/alert Behavior During Therapy: WFL for tasks assessed/performed Overall Cognitive Status: Within Functional Limits for tasks assessed                                        Exercises Total Joint Exercises Goniometric ROM: 3-115 with limited pain Other Exercises Other Exercises: supine ther-ex performed x 10 reps quad sets, SLRs, hip abd/add, heel slides, LAQ and knee flexion in sitting.    General Comments        Pertinent Vitals/Pain Pain Score: 1  Pain Location: R knee Pain Descriptors / Indicators: Sore Pain Intervention(s): Monitored during session;Premedicated before session;Ice applied    Home Living                      Prior Function            PT Goals (current goals can now be found in the care plan section) Progress towards PT goals: Progressing toward goals    Frequency    BID  PT Plan Current plan remains appropriate    Co-evaluation              AM-PAC PT "6 Clicks" Mobility   Outcome Measure  Help needed turning from your back to your side while in a flat bed without using bedrails?: None Help needed moving from lying on your back to sitting on the side of a flat bed without using bedrails?: None Help needed moving to and from a bed to a chair (including a wheelchair)?: A Little Help needed standing up from a chair using your arms (e.g., wheelchair or bedside chair)?: A Little Help needed to walk in hospital room?: A Little Help needed climbing 3-5 steps with a railing? : A Little 6 Click Score: 20    End of Session  Equipment Utilized During Treatment: Gait belt Activity Tolerance: Patient tolerated treatment well Patient left: in chair;with chair alarm set;with call bell/phone within reach Nurse Communication: Mobility status Pain - Right/Left: Right Pain - part of body: Knee     Time: [id number] PT Time Calculation (min) (ACUTE ONLY): 27 min  Charges:  $Gait Training: 8-22 mins $Therapeutic Exercise: 8-22 mins                    Chesley Noon, PTA 09/30/19, 11:51 AM

## 2019-09-30 NOTE — Anesthesia Postprocedure Evaluation (Signed)
Anesthesia Post Note  Patient: Michael Holloway  Procedure(s) Performed: TOTAL KNEE ARTHROPLASTY (Right Knee)  Patient location during evaluation: Nursing Unit Anesthesia Type: Spinal Level of consciousness: oriented and awake and alert Pain management: pain level controlled Vital Signs Assessment: post-procedure vital signs reviewed and stable Respiratory status: spontaneous breathing Cardiovascular status: blood pressure returned to baseline and stable Postop Assessment: no headache, no backache, no apparent nausea or vomiting and patient able to bend at knees Anesthetic complications: no Comments: Patient reports that he is feeling his pre existing peripheral neuropathy in his lower extremities now that the spinal has worn off.  He reports that it is at his baseline and that he attributes it to him holding some of his medicines yesterday.     Last Vitals:  Vitals:   09/30/19 0445 09/30/19 0743  BP: 140/72 (!) 150/64  Pulse: 60 63  Resp: 14 16  Temp: 36.8 C 37 C  SpO2: 98% 99%    Last Pain:  Vitals:   09/30/19 0743  TempSrc: Oral  PainSc:                  Precious Haws Aasiyah Auerbach

## 2019-09-30 NOTE — Progress Notes (Signed)
Subjective: 1 Day Post-Op Procedure(s) (LRB): TOTAL KNEE ARTHROPLASTY (Right) Patient reports pain as mild.   Patient is well but is having some increased discomfort from his peripheral neuropathy. PT and care management to assist with discharge planning. Negative for chest pain and shortness of breath Fever: no Gastrointestinal:Negative for nausea and vomiting  Objective: Vital signs in last 24 hours: Temp:  [96.8 F (36 C)-98.6 F (37 C)] 98.6 F (37 C) (05/21 0743) Pulse Rate:  [60-107] 63 (05/21 0743) Resp:  [9-19] 16 (05/21 0743) BP: (98-150)/(64-83) 150/64 (05/21 0743) SpO2:  [96 %-100 %] 99 % (05/21 0743)  Intake/Output from previous day:  Intake/Output Summary (Last 24 hours) at 09/30/2019 0811 Last data filed at 09/30/2019 0610 Gross per 24 hour  Intake 1670.97 ml  Output 3870 ml  Net -2199.03 ml    Intake/Output this shift: No intake/output data recorded.  Labs: No results for input(s): HGB in the last 72 hours. No results for input(s): WBC, RBC, HCT, PLT in the last 72 hours. Recent Labs    09/30/19 0353  NA 141  K 4.5  CL 108  CO2 26  BUN 24*  CREATININE 1.66*  GLUCOSE 107*  CALCIUM 8.4*   No results for input(s): LABPT, INR in the last 72 hours.   EXAM General - Patient is Alert, Appropriate and Oriented Extremity - ABD soft Intact pulses distally Dorsiflexion/Plantar flexion intact Incision: scant drainage  Decrease sensation to bilateral lower extremities from chronic neuropathy. Dressing/Incision - blood tinged drainage Motor Function - intact, moving foot and toes well on exam.   Past Medical History:  Diagnosis Date  . Arthritis   . Cancer ([place])    Basal Cell Skin Cancer  . Chronic kidney disease    stage 111  . Dysrhythmia   . GERD (gastroesophageal reflux disease)   . Gout   . History of kidney stones   . Hypertension   . Neuropathy   . Spinal stenosis    Assessment/Plan: 1 Day Post-Op Procedure(s) (LRB): TOTAL KNEE  ARTHROPLASTY (Right) Active Problems:   Status post total knee replacement using cement, right  Estimated body mass index is 27.02 kg/m as calculated from the following:   Height as of this encounter: 6\' 4"  (1.93 m).   Weight as of this encounter: 100.7 kg. Advance diet Up with therapy D/C IV fluids when tolerating po intake.  Labs reviewed this AM. Continue with PT today. Begin working on BM. Possible d/c home tomorrow pending progress with PT.  DVT Prophylaxis - Lovenox, Foot Pumps and TED hose Weight-Bearing as tolerated to right leg  J. Cameron Proud, PA-C [facility] Orthopaedic [facility] 09/30/2019, 8:11 AM

## 2019-09-30 NOTE — Progress Notes (Signed)
Physical Therapy Treatment Patient Details Name: Michael Holloway MRN: [id number] DOB: Mar 19, 1929 Today's Date: 09/30/2019    History of Present Illness Pt admitted for R TKR. History includes DJD, GERD, HTN, HLD, pacemaker and history of L TKR.    PT Comments    Stood and was able to complete 3 laps around nursing pod with step through gait.  Some unsteadiness remains but no LOB or buckling.  Returned to bed after session with CPM donned per his request and increased to 80 degrees.    Pt stated he is hoping to discharge home tomorrow.  Will need stair training in am.  Recommend and encouraged +1 assist with mobility for general safety with gait and mobility at home due to some continued imbalances but anticipate daily improvements.  Pt stated his son will be able to assist and daughter arrives Monday.   Follow Up Recommendations  Home health PT;Supervision for mobility/OOB     Equipment Recommendations  None recommended by PT    Recommendations for Other Services       Precautions / Restrictions Precautions Precautions: Knee;Fall Precaution Booklet Issued: No Restrictions Weight Bearing Restrictions: Yes RLE Weight Bearing: Weight bearing as tolerated    Mobility  Bed Mobility Overal bed mobility: Modified Independent Bed Mobility: Sit to Supine     Supine to sit: Supervision Sit to supine: Modified independent (Device/Increase time)   General bed mobility comments: safe technique with upright posture.  Transfers Overall transfer level: Needs assistance Equipment used: Rolling walker (2 wheeled) Transfers: Sit to/from Stand Sit to Stand: Min guard         General transfer comment: safe technique with upright posture with RW use.  Ambulation/Gait Ambulation/Gait assistance: Min guard;Min assist Gait Distance (Feet): 360 Feet Assistive device: Rolling walker (2 wheeled) Gait Pattern/deviations: Step-through pattern Gait velocity: decreased   General Gait  Details: 3 laps around small pod   Stairs             Wheelchair Mobility    Modified Rankin (Stroke Patients Only)       Balance Overall balance assessment: Needs assistance Sitting-balance support: Feet supported;Bilateral upper extremity supported Sitting balance-Leahy Scale: Good     Standing balance support: Bilateral upper extremity supported Standing balance-Leahy Scale: Fair Standing balance comment: pt generally steady but requries +1 at this time                            Cognition Arousal/Alertness: Awake/alert Behavior During Therapy: WFL for tasks assessed/performed Overall Cognitive Status: Within Functional Limits for tasks assessed                                        Exercises Total Joint Exercises Goniometric ROM: 3-115 with limited pain Other Exercises Other Exercises: CPM applied per request and flexion increased to 80 degrees.    General Comments        Pertinent Vitals/Pain Pain Assessment: 0-10 Pain Score: 1  Pain Location: R knee Pain Descriptors / Indicators: Sore Pain Intervention(s): Limited activity within patient's tolerance;Monitored during session;Repositioned;Ice applied    Home Living                      Prior Function            PT Goals (current goals can now be found in the care plan  section) Progress towards PT goals: Progressing toward goals    Frequency    BID      PT Plan Current plan remains appropriate    Co-evaluation              AM-PAC PT "6 Clicks" Mobility   Outcome Measure  Help needed turning from your back to your side while in a flat bed without using bedrails?: None Help needed moving from lying on your back to sitting on the side of a flat bed without using bedrails?: None Help needed moving to and from a bed to a chair (including a wheelchair)?: A Little Help needed standing up from a chair using your arms (e.g., wheelchair or bedside  chair)?: A Little Help needed to walk in hospital room?: A Little Help needed climbing 3-5 steps with a railing? : A Little 6 Click Score: 20    End of Session Equipment Utilized During Treatment: Gait belt Activity Tolerance: Patient tolerated treatment well Patient left: in bed;in CPM;with call bell/phone within reach;with bed alarm set Nurse Communication: Mobility status;Other (comment)(CPM placed and increased to 80) Pain - Right/Left: Right Pain - part of body: Knee     Time: [id number] PT Time Calculation (min) (ACUTE ONLY): 25 min  Charges:  $Gait Training: 23-37 mins $Therapeutic Exercise: 8-22 mins                    Chesley Noon, PTA 09/30/19, 1:46 PM

## 2019-10-01 LAB — ABO/RH: ABO/RH(D): B POS

## 2019-10-01 LAB — BASIC METABOLIC PANEL
Anion gap: 7 (ref 5–15)
BUN: 26 mg/dL — ABNORMAL HIGH (ref 8–23)
CO2: 25 mmol/L (ref 22–32)
Calcium: 8.4 mg/dL — ABNORMAL LOW (ref 8.9–10.3)
Chloride: 107 mmol/L (ref 98–111)
Creatinine, Ser: 1.51 mg/dL — ABNORMAL HIGH (ref 0.61–1.24)
GFR calc Af Amer: 47 mL/min — ABNORMAL LOW (ref >60)
GFR calc non Af Amer: 41 mL/min — ABNORMAL LOW (ref >60)
Glucose, Bld: 92 mg/dL (ref 70–99)
Potassium: 3.9 mmol/L (ref 3.5–5.1)
Sodium: 139 mmol/L (ref 135–145)

## 2019-10-01 NOTE — Plan of Care (Signed)

## 2019-10-01 NOTE — Discharge Summary (Signed)
Physician Discharge Summary  Patient ID: Michael Holloway MRN: [id number] DOB/AGE: 1928/08/17 86 y.o.  Admit date: 09/29/2019 Discharge date: 10/01/2019  Admission Diagnoses:  Status post total knee replacement using cement, right [Z96.651]  Discharge Diagnoses: Patient Active Problem List   Diagnosis Date Noted  . Status post total knee replacement using cement, right 09/29/2019  . Ambulates with cane 12/23/2018  . Elevated hemoglobin A1c 12/23/2018  . Spinal stenosis of lumbar region with neurogenic claudication 12/23/2018  . S/P placement of cardiac pacemaker 08/11/2018  . Sinus pause 07/07/2018  . Hyperlipidemia 06/10/2017  . Status post placement of implantable loop recorder 06/10/2017  . Bladder tumor 03/31/2017  . Carotid stenosis 01/07/2017  . Gross hematuria 07/23/2016  . Lumbar radiculitis 06/10/2016  . Near syncope 04/24/2016  . Bigeminal rhythm 04/24/2016  . Paresthesia of bilateral legs 03/07/2016  . Chronic lumbar radiculopathy 03/07/2016  . Essential hypertension 09/03/2015  . Age-related macular degeneration, dry, right eye 06/07/2015  . Exudative age-related macular degeneration, left eye, with active choroidal neovascularization ([place]) 04/02/2015  . Degenerative disc disease, cervical 03/03/2015  . CKD (chronic kidney disease) stage 3, GFR 30-59 ml/min (HCC) 09/28/2014  . 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 Other allergic rhinitis 09/28/2014  . History of recent fall 09/28/2014  . Gastroesophageal reflux disease without esophagitis 09/28/2014  . Chronic neck pain 09/28/2014  . Benign essential hypertension 09/28/2014    Past Medical History:  Diagnosis Date  . Arthritis   . Cancer ([place])    Basal Cell Skin Cancer  . Chronic kidney disease    stage 111  . Dysrhythmia   . GERD (gastroesophageal reflux disease)   . Gout   . History of kidney stones   . Hypertension   . Neuropathy   . Spinal stenosis      Transfusion: None.   Consultants (if any):    Discharged Condition: Improved  Hospital Course: Michael Holloway is an 86 y.o. male who was admitted 09/29/2019 with a diagnosis of primary osteoarthritis of the right knee and went to the operating room on 09/29/2019 and underwent the above named procedures.    Surgeries: Procedure(s): TOTAL KNEE ARTHROPLASTY on 09/29/2019 Patient tolerated the surgery well. Taken to PACU where she was stabilized and then transferred to the orthopedic floor.  Started on Lovenox 40mg  q 24 hrs. Foot pumps applied bilaterally at 80 mm. Heels elevated on bed with rolled towels. No evidence of DVT. Negative Homan. Physical therapy started on day #1 for gait training and transfer. OT started day #1 for ADL and assisted devices.  Patient's IV was removed on POD2.  Implants: Right TKA using all-cemented [employer] Vanguard system with a 75 mm PCR femur, an 83 mm tibial tray with a 10 mm anterior stabilized E-poly insert, and a 37 x 10 mm all-poly 3-pegged domed patella.  He was given perioperative antibiotics:  Anti-infectives (From admission, onward)   Start     Dose/Rate Route Frequency Ordered Stop   09/29/19 1330  ceFAZolin (ANCEF) IVPB 2g/100 mL premix     2 g 200 mL/hr over 30 Minutes Intravenous Every 6 hours 09/29/19 1102 09/30/19 0143   09/29/19 0624  ceFAZolin (ANCEF) 2-4 GM/100ML-% IVPB    Note to Pharmacy: Leonia Reader   : cabinet override      09/29/19 0624 09/29/19 0733   09/29/19 0615  ceFAZolin (ANCEF) IVPB 2g/100 mL premix     2 g 200 mL/hr over 30 Minutes Intravenous On call to O.R. 09/29/19 [id number] 09/29/19 0800    .  He was given sequential compression devices, early ambulation, and Lovenox for DVT prophylaxis.  He benefited maximally from the hospital stay and there were no complications.    Recent vital signs:  Vitals:   09/30/19 1239 09/30/19 1551  BP: 135/66 138/64  Pulse: 61 67  Resp: 16 16  Temp: 98.8 F (37.1 C) 98.6 F (37 C)  SpO2: 97% 99%    Recent laboratory studies:   Lab Results  Component Value Date   HGB 14.0 09/22/2019   HGB 14.1 07/13/2019   HGB 13.2 07/20/2018   Lab Results  Component Value Date   WBC 7.6 09/22/2019   PLT 263 09/22/2019   Lab Results  Component Value Date   INR 1.0 07/20/2018   Lab Results  Component Value Date   NA 139 10/01/2019   K 3.9 10/01/2019   CL 107 10/01/2019   CO2 25 10/01/2019   BUN 26 (H) 10/01/2019   CREATININE 1.51 (H) 10/01/2019   GLUCOSE 92 10/01/2019    Discharge Medications:   Allergies as of 10/01/2019      Reactions   Sulfa Antibiotics Rash      Medication List    TAKE these medications   allopurinol 100 MG tablet Commonly known as: ZYLOPRIM Take 100 mg by mouth at bedtime.   amLODipine 10 MG tablet Commonly known as: NORVASC Take 5 mg by mouth at bedtime.   aspirin EC 81 MG tablet Take 81 mg by mouth daily.   Biotin 10 MG Caps Take 10 mg by mouth daily.   CENTRUM ADULTS PO Take 1 tablet by mouth daily.   PRESERVISION AREDS 2 PO Take 1 capsule by mouth 2 (two) times daily.   cetirizine 10 MG tablet Commonly known as: ZYRTEC Take 10 mg by mouth every morning.   enoxaparin 40 MG/0.4ML injection Commonly known as: LOVENOX Inject 0.4 mLs (40 mg total) into the skin daily.   gabapentin 600 MG tablet Commonly known as: NEURONTIN Take 600 mg by mouth 3 (three) times daily.   gentamicin ointment 0.1 % Commonly known as: GARAMYCIN Apply 1 application topically in the morning and at bedtime.   losartan 50 MG tablet Commonly known as: COZAAR Take 50 mg by mouth every morning.   omeprazole 20 MG capsule Commonly known as: PRILOSEC Take 20 mg by mouth every morning.   OVER THE COUNTER MEDICATION Take 1 tablet by mouth in the morning and at bedtime. NERVOGEN   oxyCODONE 5 MG immediate release tablet Commonly known as: Oxy IR/ROXICODONE Take 1-2 tablets (5-10 mg total) by mouth every 4 (four) hours as needed for moderate pain (pain score 4-6).   STOOL SOFTENER  PO Take 1 tablet by mouth daily.   traMADol 50 MG tablet Commonly known as: ULTRAM Take 50 mg by mouth 3 (three) times daily as needed for moderate pain.   XLEAR SINUS CARE SPRAY NA Place 1 spray into the nose daily.   XYLIMELTS MT Use as directed 1 tablet in the mouth or throat at bedtime.            Durable Medical Equipment  (From admission, onward)         Start     Ordered   09/29/19 1103  DME Bedside commode  Once    Question:  Patient needs a bedside commode to treat with the following condition  Answer:  Status post total knee replacement using cement, right   09/29/19 1102   09/29/19 1103  DME 3 n 1  Once     09/29/19 1102   09/29/19 1103  DME Walker rolling  Once    Question Answer Comment  Walker: With 5 Inch Wheels   Patient needs a walker to treat with the following condition Status post total knee replacement using cement, right      09/29/19 1102         Diagnostic Studies: DG Knee Right Port  Result Date: 09/29/2019 CLINICAL DATA:  Status post right knee replacement. EXAM: PORTABLE RIGHT KNEE - 1-2 VIEW COMPARISON:  None. FINDINGS: The femoral and tibial components appear to be well situated. Expected postoperative changes are noted in the soft tissues anteriorly. Vascular calcifications are noted IMPRESSION: Status post right total knee arthroplasty. Electronically Signed   By: Marijo Conception M.D.   On: 09/29/2019 10:38   Disposition: Plan for discharge home today after morning session of PT.  Follow-up Information    Elizjah, Siemen, PA-C Follow up in 14 day(s).   Specialty: Physician Assistant Why: Electa Sniff information: [place] [id number] [phone]          Signed: GOPAL MELODY PA-C 10/01/2019, 8:02 AM

## 2019-10-01 NOTE — Progress Notes (Signed)
MD order received in [facility] to discharge pt home with home health today; TOC previously established home health physical therapy with [facility]; verbally reviewed AVS with pt, gave printed Rxs to pt; no questions voiced at this time; pt discharged via wheelchair by nursing to the [facility]

## 2019-10-01 NOTE — Progress Notes (Signed)
Physical Therapy Treatment Patient Details Name: Michael Holloway MRN: [id number] DOB: July 16, 1928 Today's Date: 10/01/2019    History of Present Illness Pt admitted for R TKR. History includes DJD, GERD, HTN, HLD, pacemaker and history of L TKR.    PT Comments    Patient demonstrates modI for supine > sit and STS transfer to date, with increased time needed and min VC reminder for hand placement for set up of STS. Patient is able to ambulate 427ft with chair follow for safety and cuing for increased L step length and RW negotiation with turning. Following demo and cuing for stair negotiation patient is able to demonstrate safety with this with CGA with BUE support on unilateral handrail. PT continues recommendation for HHPT following D/C.     Follow Up Recommendations  Home health PT;Supervision for mobility/OOB     Equipment Recommendations  None recommended by PT    Recommendations for Other Services       Precautions / Restrictions Precautions Precautions: Knee;Fall Restrictions Weight Bearing Restrictions: Yes RLE Weight Bearing: Weight bearing as tolerated    Mobility  Bed Mobility Overal bed mobility: Modified Independent Bed Mobility: Sit to Supine     Supine to sit: Modified independent (Device/Increase time)     General bed mobility comments: not seen today  Transfers Overall transfer level: Needs assistance Equipment used: Rolling walker (2 wheeled) Transfers: Sit to/from Stand Sit to Stand: Modified independent (Device/Increase time)         General transfer comment: min cuing for RW negotiation  Ambulation/Gait Ambulation/Gait assistance: Modified independent (Device/Increase time) Gait Distance (Feet): 400 Feet Assistive device: Rolling walker (2 wheeled) Gait Pattern/deviations: Step-through pattern Gait velocity: decreased   General Gait Details: decreased L step length, better with cuing   Stairs             Wheelchair Mobility     Modified Rankin (Stroke Patients Only)       Balance Overall balance assessment: Needs assistance Sitting-balance support: Feet supported;Bilateral upper extremity supported Sitting balance-Leahy Scale: Good     Standing balance support: During functional activity Standing balance-Leahy Scale: Fair Standing balance comment: pt generally steady but requries +1 at this time                            Cognition Arousal/Alertness: Awake/alert Behavior During Therapy: WFL for tasks assessed/performed Overall Cognitive Status: Within Functional Limits for tasks assessed                                        Exercises Other Exercises Other Exercises: amb over 452ft with chair follow for safety, good carry over of cuing for increased L step length, more cuing needed around turns to "stay close to RW" with decent compliance Other Exercises: stair negotiation over 4 steps, good carry over of technique for safety following demo and cuing with BUE support on single handrail to mimic home    General Comments        Pertinent Vitals/Pain Pain Score: 1  Pain Location: R knee Pain Descriptors / Indicators: Sore Pain Intervention(s): Monitored during session    Home Living                      Prior Function            PT Goals (current goals can  now be found in the care plan section) Acute Rehab PT Goals Patient Stated Goal: to go home PT Goal Formulation: With patient Time For Goal Achievement: 10/13/19 Potential to Achieve Goals: Good Additional Goals Additional Goal #1: Pt will be able to perform bed mobiltiy/transfers with supervision and safe technique in order to improve functional independence Progress towards PT goals: Progressing toward goals    Frequency    BID      PT Plan Current plan remains appropriate    Co-evaluation              AM-PAC PT "6 Clicks" Mobility   Outcome Measure    Help needed moving  from lying on your back to sitting on the side of a flat bed without using bedrails?: None Help needed moving to and from a bed to a chair (including a wheelchair)?: A Little Help needed standing up from a chair using your arms (e.g., wheelchair or bedside chair)?: A Little Help needed to walk in hospital room?: A Little   6 Click Score: 13    End of Session Equipment Utilized During Treatment: Gait belt Activity Tolerance: Patient tolerated treatment well Patient left: with call bell/phone within reach;in chair;with SCD's reapplied;with chair alarm set Nurse Communication: Mobility status;Other (comment) PT Visit Diagnosis: Muscle weakness (generalized) (M62.81);Difficulty in walking, not elsewhere classified (R26.2);Pain Pain - Right/Left: Right Pain - part of body: Knee     Time: 0901-0926 PT Time Calculation (min) (ACUTE ONLY): 25 min  Charges:  $Gait Training: 8-22 mins $Therapeutic Activity: 8-22 mins                     Shelton Silvas PT, DPT   [place] 10/01/2019, 10:07 AM

## 2019-10-01 NOTE — Discharge Instructions (Signed)
Diet: As you were doing prior to hospitalization   Shower:  May shower but keep the wounds dry, use an occlusive plastic wrap, NO SOAKING IN TUB.  If the bandage gets wet, change with a clean dry gauze.  Dressing:  You may change your dressing as needed. Change the dressing with sterile gauze dressing.    Activity:  Increase activity slowly as tolerated, but follow the weight bearing instructions below.  No lifting or driving for 6 weeks.  Weight Bearing:   Weight bearing as tolerated to right lower extremity  To prevent constipation: you may use a stool softener such as -  Colace (over the counter) 100 mg by mouth twice a day  Drink plenty of fluids (prune juice may be helpful) and high fiber foods Miralax (over the counter) for constipation as needed.    Itching:  If you experience itching with your medications, try taking only a single pain pill, or even half a pain pill at a time.  You may take up to 10 pain pills per day, and you can also use benadryl over the counter for itching or also to help with sleep.   Precautions:  If you experience chest pain or shortness of breath - call 911 immediately for transfer to the hospital emergency department!!  If you develop a fever greater that 101 F, purulent drainage from wound, increased redness or drainage from wound, or calf pain-Call [place]                                               Follow- Up Appointment:  Please call for an appointment to be seen in 2 weeks at [place]

## 2019-10-01 NOTE — Progress Notes (Signed)
Subjective: 2 Days Post-Op Procedure(s) (LRB): TOTAL KNEE ARTHROPLASTY (Right) Patient reports pain as mild.   Patient is well, neuropathy pain under better control. Slept well. Plan for d/c home today following morning session of PT. Negative for chest pain and shortness of breath Fever: no Gastrointestinal:Negative for nausea and vomiting  Objective: Vital signs in last 24 hours: Temp:  [98.6 F (37 C)-98.8 F (37.1 C)] 98.6 F (37 C) (05/21 1551) Pulse Rate:  [61-67] 67 (05/21 1551) Resp:  [16] 16 (05/21 1551) BP: (135-138)/(64-66) 138/64 (05/21 1551) SpO2:  [97 %-99 %] 99 % (05/21 1551)  Intake/Output from previous day:  Intake/Output Summary (Last 24 hours) at 10/01/2019 0800 Last data filed at 10/01/2019 0326 Gross per 24 hour  Intake --  Output 1525 ml  Net -1525 ml    Intake/Output this shift: No intake/output data recorded.  Labs: No results for input(s): HGB in the last 72 hours. No results for input(s): WBC, RBC, HCT, PLT in the last 72 hours. Recent Labs    09/30/19 0353 10/01/19 0416  NA 141 139  K 4.5 3.9  CL 108 107  CO2 26 25  BUN 24* 26*  CREATININE 1.66* 1.51*  GLUCOSE 107* 92  CALCIUM 8.4* 8.4*   No results for input(s): LABPT, INR in the last 72 hours.   EXAM General - Patient is Alert, Appropriate and Oriented Extremity - ABD soft Intact pulses distally Dorsiflexion/Plantar flexion intact Incision: scant drainage No cellulitis present  Decrease sensation to bilateral lower extremities from chronic neuropathy. Dressing/Incision - blood tinged drainage Motor Function - intact, moving foot and toes well on exam.  Abdomen soft with normal BS. Negative Homan's to b/l legs.  Past Medical History:  Diagnosis Date  . Arthritis   . Cancer ([place])    Basal Cell Skin Cancer  . Chronic kidney disease    stage 111  . Dysrhythmia   . GERD (gastroesophageal reflux disease)   . Gout   . History of kidney stones   . Hypertension   .  Neuropathy   . Spinal stenosis    Assessment/Plan: 2 Days Post-Op Procedure(s) (LRB): TOTAL KNEE ARTHROPLASTY (Right) Active Problems:   Status post total knee replacement using cement, right  Estimated body mass index is 27.02 kg/m as calculated from the following:   Height as of this encounter: 6\' 4"  (1.93 m).   Weight as of this encounter: 100.7 kg. Advance diet Up with therapy D/C IV fluids when tolerating po intake.  Labs reviewed this AM. Continue with PT today.  Needs to clear stairs prior to d/c. Begin working on BM.  Passing gas without pain. Plan for d/chome with HHPT today following stair session with PT.  DVT Prophylaxis - Lovenox, Foot Pumps and TED hose Weight-Bearing as tolerated to right leg  J. Cameron Proud, PA-C [facility] 10/01/2019, 8:00 AM

## 2019-10-25 ENCOUNTER — Other Ambulatory Visit: Payer: Medicare Other | Admitting: Urology

## 2019-12-05 NOTE — Progress Notes (Signed)
   12/06/2019  CC:  Chief Complaint  Patient presents with  . Cysto    HPI: Michael Holloway is a 86 y.o. male diagnosed with low-grade TA TCC with focal high-grade components on 08/25/2016 found to have another small recurrence who presents today for a cystoscopy.   In 03/2017, he was noted to have small recurrences most consistent with low-grade noninvasive lesions.  He is now undergone 3 office procedures for an office fulguration for low-grade superficial appearing tumors including on 03/31/2017, 05/2016, and 11/2017.  Today for routine surveillance cystoscopy.  Most recent upper tract imaging in the form of CT abdomen and pelvis without contrast on 3/18 which was unremarkable. Bilateral retrograde pyelogram in the operating room of 08/2016 also negative.  He returned to the operating room on 06/2018 for repeat TURBT for small tumors involving the prostatic urethra and bladder neck, small.  Tumors were primarily low-grade with some high-grade features, superficial.  Muscularis propria was present but not involved.  Intravesical gemcitabine was instilled.  He additional low-grade appearing recurrence at the dome and underwent office fulguration of these 01/2019  Postoperatively, the plan was to undergo induction course of BCG, however in light of COVID-19 global pandemic, these have been deferred in light of PPE issues.  He since completed this about 3 months ago (05/2019).  He underwent a small TURBT on 07/18/2019. Carpeting of low-grade appearing papillary tumor at dome of bladder, just to the right of previously fulgurated area in the office, measuring approximately 1 cm.  There is a few small satellite lesions as well ranging from 1 to 2 mm.  Unremarkable retrograde pyelogram.  Patient was admitted to [facility] from 09/29/2019 to 10/01/2019 and underwent a total right knee replacement.   He does smoking history, 3/4 ppd x 20 years. Quit in 1980.      Blood pressure (!) 78/47, pulse (!)  178. NED. A&Ox3.   No respiratory distress   Abd soft, NT, ND Normal external genitalia with patent urethral meatus  Cystoscopy Procedure Note  Patient identification was confirmed, informed consent was obtained, and patient was prepped using Betadine solution.  Lidocaine jelly was administered per urethral meatus.    Procedure: - Flexible cystoscope introduced, without any difficulty.   - Thorough search of the bladder revealed:    normal urethral meatus    normal urothelium    Multiple scars primarily at the dome of the bladder.    no stones    no ulcers     no tumors    no urethral polyps    Mild trabeculation  - Ureteral orifices were normal in position and appearance.  Retroflexion showed small intravesical median lobe.   Post-Procedure: - Patient tolerated the procedure well  Assessment/ Plan:  1. Malignant neoplasm of posterior wall (HCC) Cystoscopy was unremarkable with no evidence of disease. Continue close surveillance given recurrent bladder cancer episodes.  Follow up in 3 months.     Fransico Him, am acting as a scribe for Dr. Hollice Espy.  I have reviewed the above documentation for accuracy and completeness, and I agree with the above.   Hollice Espy, MD

## 2019-12-06 ENCOUNTER — Ambulatory Visit (INDEPENDENT_AMBULATORY_CARE_PROVIDER_SITE_OTHER): Payer: Medicare Other | Admitting: Urology

## 2019-12-06 ENCOUNTER — Encounter: Payer: Self-pay | Admitting: Urology

## 2019-12-06 ENCOUNTER — Other Ambulatory Visit: Payer: Self-pay

## 2019-12-06 VITALS — BP 78/47 | HR 178

## 2019-12-06 DIAGNOSIS — Z8551 Personal history of malignant neoplasm of bladder: Secondary | ICD-10-CM | POA: Diagnosis not present

## 2019-12-07 LAB — MICROSCOPIC EXAMINATION: Bacteria, UA: NONE SEEN

## 2019-12-07 LAB — URINALYSIS, COMPLETE
Bilirubin, UA: NEGATIVE
Glucose, UA: NEGATIVE
Leukocytes,UA: NEGATIVE
Nitrite, UA: NEGATIVE
RBC, UA: NEGATIVE
Specific Gravity, UA: 1.015 (ref 1.005–1.030)
Urobilinogen, Ur: 1 mg/dL (ref 0.2–1.0)
pH, UA: 6 (ref 5.0–7.5)

## 2020-01-10 ENCOUNTER — Other Ambulatory Visit (INDEPENDENT_AMBULATORY_CARE_PROVIDER_SITE_OTHER): Payer: Self-pay | Admitting: Nurse Practitioner

## 2020-01-10 DIAGNOSIS — I6523 Occlusion and stenosis of bilateral carotid arteries: Secondary | ICD-10-CM

## 2020-01-11 ENCOUNTER — Ambulatory Visit (INDEPENDENT_AMBULATORY_CARE_PROVIDER_SITE_OTHER): Payer: Medicare Other | Admitting: Nurse Practitioner

## 2020-01-11 ENCOUNTER — Encounter (INDEPENDENT_AMBULATORY_CARE_PROVIDER_SITE_OTHER): Payer: Self-pay | Admitting: Nurse Practitioner

## 2020-01-11 ENCOUNTER — Ambulatory Visit (INDEPENDENT_AMBULATORY_CARE_PROVIDER_SITE_OTHER): Payer: Medicare Other

## 2020-01-11 ENCOUNTER — Other Ambulatory Visit: Payer: Self-pay

## 2020-01-11 VITALS — BP 146/80 | HR 72 | Ht 76.0 in | Wt 209.0 lb

## 2020-01-11 DIAGNOSIS — I6523 Occlusion and stenosis of bilateral carotid arteries: Secondary | ICD-10-CM

## 2020-01-11 DIAGNOSIS — E782 Mixed hyperlipidemia: Secondary | ICD-10-CM

## 2020-01-11 DIAGNOSIS — I10 Essential (primary) hypertension: Secondary | ICD-10-CM

## 2020-01-16 ENCOUNTER — Encounter (INDEPENDENT_AMBULATORY_CARE_PROVIDER_SITE_OTHER): Payer: Self-pay | Admitting: Nurse Practitioner

## 2020-01-16 NOTE — Progress Notes (Signed)
Subjective:    Patient ID: Michael Holloway, male    DOB: 05/25/1928, 86 y.o.   MRN: [id number] Chief Complaint  Patient presents with  . Follow-up    U/S follow up    The patient is seen for follow up evaluation of carotid stenosis. The carotid stenosis followed by ultrasound.   The patient denies amaurosis fugax. There is no recent history of TIA symptoms or focal motor deficits. There is no prior documented CVA.  The patient is taking enteric-coated aspirin 81 mg daily.  There is no history of migraine headaches. There is no history of seizures.  The patient has a history of coronary artery disease, no recent episodes of angina or shortness of breath. The patient denies PAD or claudication symptoms. There is a history of hyperlipidemia which is being treated with a statin.    Carotid Duplex done today shows 1-39% stenosis bilaterally.  No change compared to last study in 01/10/2019   Review of Systems  Eyes: Negative for visual disturbance.  Musculoskeletal: Positive for arthralgias and back pain.       Objective:   Physical Exam Vitals reviewed.  Neck:     Vascular: No carotid bruit.  Cardiovascular:     Rate and Rhythm: Normal rate and regular rhythm.     Pulses: Normal pulses.  Pulmonary:     Effort: Pulmonary effort is normal.  Skin:    General: Skin is warm and dry.  Neurological:     Mental Status: He is alert and oriented to person, place, and time.  Psychiatric:        Mood and Affect: Mood normal.        Behavior: Behavior normal.        Thought Content: Thought content normal.        Judgment: Judgment normal.     BP (!) 146/80   Pulse 72   Ht 6\' 4"  (1.93 m)   Wt 209 lb (94.8 kg)   BMI 25.44 kg/m   Past Medical History:  Diagnosis Date  . Arthritis   . Cancer ([place])    Basal Cell Skin Cancer  . Chronic kidney disease    stage 111  . Dysrhythmia   . GERD (gastroesophageal reflux disease)   . Gout   . History of kidney stones   .  Hypertension   . Neuropathy   . Spinal stenosis     Social History   Socioeconomic History  . Marital status: Widowed    Spouse name: Not on file  . Number of children: Not on file  . Years of education: Not on file  . Highest education level: Not on file  Occupational History  . Not on file  Tobacco Use  . Smoking status: Former Smoker    Packs/day: 0.50    Years: 30.00    Pack years: 15.00    Types: Cigarettes    Quit date: 08/11/1978    Years since quitting: 41.4  . Smokeless tobacco: Never Used  Vaping Use  . Vaping Use: Never used  Substance and Sexual Activity  . Alcohol use: Not Currently    Comment: 1 BEER DAILY  . Drug use: No  . Sexual activity: Yes  Other Topics Concern  . Not on file  Social History Narrative  . Not on file   Social Determinants of Health   Financial Resource Strain:   . Difficulty of Paying Living Expenses: Not on file  Food Insecurity:   . Worried  About Running Out of Food in the Last Year: Not on file  . Ran Out of Food in the Last Year: Not on file  Transportation Needs:   . Lack of Transportation (Medical): Not on file  . Lack of Transportation (Non-Medical): Not on file  Physical Activity:   . Days of Exercise per Week: Not on file  . Minutes of Exercise per Session: Not on file  Stress:   . Feeling of Stress : Not on file  Social Connections:   . Frequency of Communication with Friends and Family: Not on file  . Frequency of Social Gatherings with Friends and Family: Not on file  . Attends Religious Services: Not on file  . Active Member of Clubs or Organizations: Not on file  . Attends Archivist Meetings: Not on file  . Marital Status: Not on file  Intimate Partner Violence:   . Fear of Current or Ex-Partner: Not on file  . Emotionally Abused: Not on file  . Physically Abused: Not on file  . Sexually Abused: Not on file    Past Surgical History:  Procedure Laterality Date  . BACK SURGERY    . CERVICAL  SPINE SURGERY    . CHOLECYSTECTOMY    . CYSTOSCOPY W/ RETROGRADES Bilateral 08/25/2016   Procedure: CYSTOSCOPY WITH RETROGRADE PYELOGRAM;  Surgeon: Hollice Espy, MD;  Location: [facility];  Service: Urology;  Laterality: Bilateral;  . CYSTOSCOPY W/ RETROGRADES Bilateral 07/18/2019   Procedure: CYSTOSCOPY WITH RETROGRADE PYELOGRAM;  Surgeon: Hollice Espy, MD;  Location: [facility];  Service: Urology;  Laterality: Bilateral;  . EYE SURGERY Bilateral    Cataract Extraction with IOL  . LOOP RECORDER INSERTION N/A 06/03/2017   Procedure: LOOP RECORDER INSERTION;  Surgeon: Isaias Cowman, MD;  Location: [facility];  Service: Cardiovascular;  Laterality: N/A;  . LUMBAR LAMINECTOMY    . PACEMAKER INSERTION Left 08/03/2018   Procedure: INSERTION PACEMAKER DUALCHAMBER;  Surgeon: Isaias Cowman, MD;  Location: [facility];  Service: Cardiovascular;  Laterality: Left;  . PROSTATE SURGERY  1990s   Dr. Eliberto Ivory ,in office procedure  . TONSILLECTOMY    . TOTAL KNEE ARTHROPLASTY Right 09/29/2019   Procedure: TOTAL KNEE ARTHROPLASTY;  Surgeon: Corky Mull, MD;  Location: [facility];  Service: Orthopedics;  Laterality: Right;  . TRANSURETHRAL RESECTION OF BLADDER TUMOR WITH MITOMYCIN-C N/A 08/25/2016   Procedure: TRANSURETHRAL RESECTION OF BLADDER TUMOR WITH MITOMYCIN-C;  Surgeon: Hollice Espy, MD;  Location: [facility];  Service: Urology;  Laterality: N/A;  . TRANSURETHRAL RESECTION OF BLADDER TUMOR WITH MITOMYCIN-C N/A 07/18/2019   Procedure: TRANSURETHRAL RESECTION OF BLADDER TUMOR WITH gemcitabine;  Surgeon: Hollice Espy, MD;  Location: [facility];  Service: Urology;  Laterality: N/A;  . TRANSURETHRAL RESECTION OF PROSTATE N/A 06/14/2018   Procedure: TRANSURETHRAL RESECTION OF THE PROSTATE (TURP) with Gemcitabine;  Surgeon: Hollice Espy, MD;  Location: [facility];  Service: Urology;  Laterality: N/A;    Family History  Problem Relation Age of Onset  . Bladder Cancer Neg Hx   . Kidney cancer Neg  Hx   . Prostate cancer Neg Hx     Allergies  Allergen Reactions  . Sulfa Antibiotics Rash       Assessment & Plan:   1. Bilateral carotid artery stenosis Recommend:  Given the patient's asymptomatic subcritical stenosis no further invasive testing or surgery at this time.  Duplex ultrasound shows 1 to 39% stenosis bilaterally.  Continue antiplatelet therapy as prescribed Continue management of CAD, HTN and Hyperlipidemia Healthy  heart diet,  encouraged exercise at least 4 times per week Follow up in 12 months with duplex ultrasound and physical exam   2. Essential hypertension Continue antihypertensive medications as already ordered, these medications have been reviewed and there are no changes at this time.   3. Mixed hyperlipidemia Continue statin as ordered and reviewed, no changes at this time    Current Outpatient Medications on File Prior to Visit  Medication Sig Dispense Refill  . allopurinol (ZYLOPRIM) 100 MG tablet Take 100 mg by mouth at bedtime.     Marland Kitchen aspirin EC 81 MG tablet Take 81 mg by mouth daily.    . Biotin 10 MG CAPS Take 10 mg by mouth daily.     . cetirizine (ZYRTEC) 10 MG tablet Take 10 mg by mouth every morning.     Mariane Baumgarten Calcium (STOOL SOFTENER PO) Take 1 tablet by mouth daily.    Marland Kitchen gabapentin (NEURONTIN) 600 MG tablet Take 600 mg by mouth 3 (three) times daily.     Marland Kitchen gentamicin ointment (GARAMYCIN) 0.1 % Apply 1 application topically in the morning and at bedtime.    . Multiple Vitamins-Minerals (PRESERVISION AREDS 2 PO) Take 1 capsule by mouth 2 (two) times daily.     Marland Kitchen omeprazole (PRILOSEC) 20 MG capsule Take 20 mg by mouth every morning.     . Sodium Chloride-Xylitol (XLEAR SINUS CARE SPRAY NA) Place 1 spray into the nose daily.    . traMADol (ULTRAM) 50 MG tablet Take 50 mg by mouth 3 (three) times daily as needed for moderate pain.     . Xylitol (XYLIMELTS MT) Use as directed 1 tablet in the mouth or throat at bedtime.     . Multiple  Vitamins-Minerals (CENTRUM ADULTS PO) Take 1 tablet by mouth daily.      Current Facility-Administered Medications on File Prior to Visit  Medication Dose Route Frequency Provider Last Rate Last Admin  . gemcitabine (GEMZAR) 2,000 mg in sodium chloride irrigation 0.9 % chemo infusion  2,000 mg Irrigation Once Hollice Espy, MD      . gemcitabine [facility]) chemo syringe for bladder instillation 2,000 mg  2,000 mg Bladder Instillation Once Hollice Espy, MD      . lidocaine (XYLOCAINE) 2 % (with pres) injection 1,000 mg  50 mL Other Once Hollice Espy, MD      . lidocaine (XYLOCAINE) 2 % jelly 1 application  1 application Urethral Once Hollice Espy, MD        There are no Patient Instructions on file for this visit. No follow-ups on file.   Kris Hartmann, NP

## 2020-01-19 ENCOUNTER — Other Ambulatory Visit: Payer: Self-pay

## 2020-03-06 NOTE — Progress Notes (Signed)
° °  03/07/2020  CC:  Chief Complaint  Patient presents with   Cysto    HQP:Michael Holloway is a 86 y.o. male who is seen today for 3 month surveillance cystoscopy.   In 03/2017, he was noted to have small recurrences most consistent with low-grade noninvasive lesions. He is now undergone 3 office procedures for an office fulguration for low-grade superficial appearing tumors including on 03/31/2017, 05/2016, and 11/2017. Today for routine surveillance cystoscopy.  Most recent upper tract imaging in the form of CT abdomen and pelvis without contrast on 3/18 which was unremarkable. Bilateral retrograde pyelogram in the operating room of 08/2016 also negative.  He returned to the operating room on 06/2018 for repeat TURBT for small tumors involving the prostatic urethra and bladder neck, small. Tumors were primarily low-grade with some high-grade features, superficial. Muscularis propria was present but not involved. Intravesical gemcitabine was instilled.  He additional low-grade appearing recurrence at the dome and underwent office fulguration of these 01/2019  Patient underwent BCG and full inductio course in 05/2019.   He underwent a small TURBT on 07/18/2019. Carpeting of low-grade appearing papillary tumor at dome of bladder, just to the right of previously fulgurated area in the office, measuring approximately 1 cm. There is a few small satellite lesions as well ranging from 1 to 2 mm. Unremarkable retrograde pyelogram.  He does smoking history, 3/4 ppd x 20 years. Quit in 1980.    Blood pressure 103/62, pulse 85. NED. A&Ox3.   No respiratory distress   Abd soft, NT, ND Normal phallus with bilateral descended testicles  Cystoscopy Procedure Note  Patient identification was confirmed, informed consent was obtained, and patient was prepped using Betadine solution.  Lidocaine jelly was administered per urethral meatus.     Pre-Procedure: - Inspection reveals a normal  caliber ureteral meatus.  Procedure: The flexible cystoscope was introduced without difficulty - No urethral strictures/lesions are present. - Enlarged prostate  - Normal bladder neck - Bilateral ureteral orifices identified - Bladder mucosa  reveals no ulcers or lesions - Multiple stellate scars  - Low grade papillary carpeting 1 cm x 1.5 cm consistent with bladder cancer recurrence at right - No bladder stones - Mild trabeculation  Retroflexion shows small intravesical median lobe.    Post-Procedure: - Patient tolerated the procedure well  Assessment/ Plan:  1. Bladder cancer of the right dome Patient underwent and failed BCG and full induction course in 05/2019; recurrent disease today  Recommend TURBT, bilateral retrograde pyelogram and intravesical gemcitabine over at the cancer center.   Risk of bleeding, infection, damage surrounding structures, injury to the bladder/ urethra,irritative voiding symptoms were all discussed in detail.    Patient is agreeable with plan.  Given that he is failed BCG induction course with yet another recurrence, will consider induction course of intravesical chemotherapy following this resection  -Urine culture sent.    Fransico Him, am acting as a scribe for Dr. Hollice Espy.  I have reviewed the above documentation for accuracy and completeness, and I agree with the above.   Hollice Espy, MD

## 2020-03-06 NOTE — H&P (View-Only) (Signed)
° °  03/07/2020  CC:  Chief Complaint  Patient presents with   Cysto    [id number]:[id number] Michael Holloway is a 86 y.o. male who is seen today for 3 month surveillance cystoscopy.   In 03/2017, he was noted to have small recurrences most consistent with low-grade noninvasive lesions. He is now undergone 3 office procedures for an office fulguration for low-grade superficial appearing tumors including on 03/31/2017, 05/2016, and 11/2017. Today for routine surveillance cystoscopy.  Most recent upper tract imaging in the form of CT abdomen and pelvis without contrast on 3/18 which was unremarkable. Bilateral retrograde pyelogram in the operating room of 08/2016 also negative.  He returned to the operating room on 06/2018 for repeat TURBT for small tumors involving the prostatic urethra and bladder neck, small. Tumors were primarily low-grade with some high-grade features, superficial. Muscularis propria was present but not involved. Intravesical gemcitabine was instilled.  He additional low-grade appearing recurrence at the dome and underwent office fulguration of these 01/2019  Patient underwent BCG and full inductio course in 05/2019.   He underwent a small TURBT on 07/18/2019. Carpeting of low-grade appearing papillary tumor at dome of bladder, just to the right of previously fulgurated area in the office, measuring approximately 1 cm. There is a few small satellite lesions as well ranging from 1 to 2 mm. Unremarkable retrograde pyelogram.  He does smoking history, 3/4 ppd x 20 years. Quit in 1980.    Blood pressure 103/62, pulse 85. NED. A&Ox3.   No respiratory distress   Abd soft, NT, ND Normal phallus with bilateral descended testicles  Cystoscopy Procedure Note  Patient identification was confirmed, informed consent was obtained, and patient was prepped using Betadine solution.  Lidocaine jelly was administered per urethral meatus.     Pre-Procedure: - Inspection reveals a normal  caliber ureteral meatus.  Procedure: The flexible cystoscope was introduced without difficulty - No urethral strictures/lesions are present. - Enlarged prostate  - Normal bladder neck - Bilateral ureteral orifices identified - Bladder mucosa  reveals no ulcers or lesions - Multiple stellate scars  - Low grade papillary carpeting 1 cm x 1.5 cm consistent with bladder cancer recurrence at right - No bladder stones - Mild trabeculation  Retroflexion shows small intravesical median lobe.    Post-Procedure: - Patient tolerated the procedure well  Assessment/ Plan:  1. Bladder cancer of the right dome Patient underwent and failed BCG and full induction course in 05/2019; recurrent disease today  Recommend TURBT, bilateral retrograde pyelogram and intravesical gemcitabine over at the cancer center.   Risk of bleeding, infection, damage surrounding structures, injury to the bladder/ urethra,irritative voiding symptoms were all discussed in detail.    Patient is agreeable with plan.  Given that he is failed BCG induction course with yet another recurrence, will consider induction course of intravesical chemotherapy following this resection  -Urine culture sent.    Michael Holloway, am acting as a scribe for Dr. Hollice Holloway.  I have reviewed the above documentation for accuracy and completeness, and I agree with the above.   Michael Espy, MD

## 2020-03-07 ENCOUNTER — Other Ambulatory Visit: Payer: Self-pay | Admitting: Radiology

## 2020-03-07 ENCOUNTER — Ambulatory Visit (INDEPENDENT_AMBULATORY_CARE_PROVIDER_SITE_OTHER): Payer: Medicare Other | Admitting: Urology

## 2020-03-07 ENCOUNTER — Other Ambulatory Visit: Payer: Self-pay

## 2020-03-07 VITALS — BP 103/62 | HR 85

## 2020-03-07 DIAGNOSIS — Z8551 Personal history of malignant neoplasm of bladder: Secondary | ICD-10-CM | POA: Diagnosis not present

## 2020-03-07 DIAGNOSIS — R310 Gross hematuria: Secondary | ICD-10-CM

## 2020-03-07 DIAGNOSIS — C671 Malignant neoplasm of dome of bladder: Secondary | ICD-10-CM

## 2020-03-07 MED ORDER — GEMCITABINE CHEMO FOR BLADDER INSTILLATION 2000 MG: 2000.0000 mg | Freq: Once | INTRAVENOUS | Status: DC

## 2020-03-08 LAB — URINALYSIS, COMPLETE
Bilirubin, UA: NEGATIVE
Glucose, UA: NEGATIVE
Ketones, UA: NEGATIVE
Leukocytes,UA: NEGATIVE
Nitrite, UA: NEGATIVE
RBC, UA: NEGATIVE
Specific Gravity, UA: 1.02 (ref 1.005–1.030)
Urobilinogen, Ur: 0.2 mg/dL (ref 0.2–1.0)
pH, UA: 6 (ref 5.0–7.5)

## 2020-03-08 LAB — MICROSCOPIC EXAMINATION: Bacteria, UA: NONE SEEN

## 2020-03-14 LAB — CULTURE, URINE COMPREHENSIVE

## 2020-03-16 ENCOUNTER — Other Ambulatory Visit: Payer: Medicare Other

## 2020-03-16 ENCOUNTER — Encounter
Admission: RE | Admit: 2020-03-16 | Discharge: 2020-03-16 | Disposition: A | Discharge status: Home / Self Care | Payer: Medicare Other | Source: Ambulatory Visit | Attending: Urology | Admitting: Urology

## 2020-03-16 ENCOUNTER — Other Ambulatory Visit: Payer: Self-pay

## 2020-03-16 DIAGNOSIS — Z8551 Personal history of malignant neoplasm of bladder: Secondary | ICD-10-CM

## 2020-03-16 DIAGNOSIS — Z01818 Encounter for other preprocedural examination: Secondary | ICD-10-CM | POA: Diagnosis present

## 2020-03-16 DIAGNOSIS — R310 Gross hematuria: Secondary | ICD-10-CM

## 2020-03-16 LAB — URINALYSIS, COMPLETE
Bilirubin, UA: NEGATIVE
Glucose, UA: NEGATIVE
Leukocytes,UA: NEGATIVE
Nitrite, UA: NEGATIVE
Specific Gravity, UA: 1.02 (ref 1.005–1.030)
Urobilinogen, Ur: 0.2 mg/dL (ref 0.2–1.0)
pH, UA: 6 (ref 5.0–7.5)

## 2020-03-16 LAB — CBC
HCT: 41.7 % (ref 39.0–52.0)
Hemoglobin: 13 g/dL (ref 13.0–17.0)
MCH: 27.5 pg (ref 26.0–34.0)
MCHC: 31.2 g/dL (ref 30.0–36.0)
MCV: 88.2 fL (ref 80.0–100.0)
Platelets: 240 10*3/uL (ref 150–400)
RBC: 4.73 MIL/uL (ref 4.22–5.81)
RDW: 14.2 % (ref 11.5–15.5)
WBC: 8.2 10*3/uL (ref 4.0–10.5)
nRBC: 0 % (ref 0.0–0.2)

## 2020-03-16 LAB — MICROSCOPIC EXAMINATION: Bacteria, UA: NONE SEEN

## 2020-03-16 LAB — BASIC METABOLIC PANEL
Anion gap: 8 (ref 5–15)
BUN: 23 mg/dL (ref 8–23)
CO2: 28 mmol/L (ref 22–32)
Calcium: 8.6 mg/dL — ABNORMAL LOW (ref 8.9–10.3)
Chloride: 108 mmol/L (ref 98–111)
Creatinine, Ser: 1.51 mg/dL — ABNORMAL HIGH (ref 0.61–1.24)
GFR, Estimated: 44 mL/min — ABNORMAL LOW (ref >60)
Glucose, Bld: 111 mg/dL — ABNORMAL HIGH (ref 70–99)
Potassium: 4.4 mmol/L (ref 3.5–5.1)
Sodium: 144 mmol/L (ref 135–145)

## 2020-03-16 NOTE — Pre-Procedure Instructions (Signed)
Progress Notes - documented in this encounter  Michael Cowman, MD - 03/14/2020 10:30 AM EDT Formatting of this note is different from the original. Established Patient Visit   Chief Complaint: Chief Complaint  Patient presents with   Pacer-ICD  Date of Service: 03/14/2020 Date of Birth: 1928-09-12 PCP: Harrold Donath, MD  History of Present Illness: Michael Holloway is a 86 y.o.male patient who returns for  1. Recurrent presyncope 2. Sick sinus syndrome/sinus pauses 3. Essential hypertension 4. Status post Linq 06/03/2017 5. Status post dual-chamber pacemaker 08/03/2018 6. Peripheral neuropathy  The patient has a history of peripheral neuropathy with extensive degenerative disc disease status post multiple back surgeries. He has had a 1-year history of recurrent episodes of lightheadedness, fatigue and postural dizziness. The patient had a presyncopal episode while singing in the choir in church. Patient also had a presyncopal episode, fell to the floor in his kitchen, without complete loss of consciousness. He reports 2 episodes of lightheadedness per month. He saw his primary care provider; ECG revealed sinus rhythm, but computerized reading was atrial fibrillation, and the patient was sent to [facility] ED, where he was noted to be in sinus rhythm. Previous 24-hour Holter monitor on 04/10/2016 revealed normal sinus rhythm, with frequent PVCs and PACs, with one 7 beat atrial run.  The patient was seen 07/07/2018, reported more frequent episodes of lightheadedness, with near syncope, especially in the morning, associated with weakness. Patient also reported experiencing chest heaviness with exercise. Linq downloads 11/11/2017 to 01/04/2018, 01/18/2018 to 01/30/2018, 06/02/2020 - 2/21 2020 revealed 124 pauses lifetime, longest recent pause of 4.8 seconds on 06/30/2018. Patient underwent successful dual-chamber pacemaker implantation on 08/03/2018. He had one episode of heartburn on 08/05/2018  with associated heart racing without recurrence.   Patient returns today, reports "doing well". He denies chest pain or shortness of breath. He denies palpitations or heart racing. He denies peripheral edema. He denies recurrent presyncope or syncope. The patient remains active, rides a stationary bike and lifts light weights regularly at the [facility]. Pacemaker interrogation per [facility] 12/23/2019 revealed predominant atrial pacing with ventricular sensing, less than 0.1% AT/AF, with longevity of 12.7 years.  The patient has essential hypertension, systolic blood pressure mildly elevated on diet therapy. The patient follows a low-sodium, no added salt diet.  Past Medical and Surgical History  Past Medical History Past Medical History:  Diagnosis Date   Allergic state   Barrett's esophagus 2001   BPH (benign prostatic hyperplasia)  s/p TUNA procedure   Cancer (CMS-HCC) 1980  Basil Cell   Carotid artery occlusion 2013  Partial Blockage left carotid artery   Cataract cortical, senile 2002  Lens emplant   Chronic renal insufficiency   DJD (degenerative joint disease) of knee  bilateral knees   GERD (gastroesophageal reflux disease)   Gout   Hyperlipidemia   Hypertension   Kidney disease 2003   Macular degeneration   Neuropathy of left lower extremity   Other allergic rhinitis   Pacemaker   Past Surgical History He has a past surgical history that includes Cholecystectomy (1985); Replacement total knee (Left, 2008); TUNA procedure; Laminectomy Lumbar Spine; Posterior laminectomy / decompression cervical spine (07/03/2004); Colonoscopy (09/18/1997); Colonoscopy (04/18/2002); Colonoscopy (09/08/2005); egd (09/18/1997); egd (12/29/2011, 11/02/2008, 09/08/2005, 10/26/2000); lens eye surgery (Bilateral); Prostate surgery (TUNA 1997); Cataract extraction (2004); Tonsillectomy (1945); Joint replacement (2008); Knee arthroscopy (2001); and Right TKA using all-cemented Biomet  Vanguard system with a 75 mm PCR femur, an 83 mm tibial tray with a 10 mm  anterior stabilized E-poly insert, and a 37 x 10 mm all-poly 3-pegged domed patella. (Right, 09/29/2019).   Medications and Allergies  Current Medications  Current Outpatient Medications  Medication Sig Dispense Refill   allopurinoL (ZYLOPRIM) 100 MG tablet TAKE ONE TABLET BY MOUTH DAILY 90 tablet 1   aspirin 81 MG EC tablet Take 81 mg by mouth once daily   biotin 10,000 mcg Cap Take by mouth   cetirizine (ZYRTEC) 10 MG tablet Take by mouth Take 10 mg by mouth every morning.   gabapentin (NEURONTIN) 600 MG tablet Take 1 tablet (600 mg total) by mouth 3 (three) times daily And 1/2 extra prn 315 tablet 3   gentamicin (GARAMYCIN) 0.3 % (3 mg/gram) ophthalmic ointment 0.5 inches 2 (two) times daily   omeprazole (PRILOSEC) 20 MG DR capsule TAKE ONE CAPSULE BY MOUTH DAILY AS NEEDED 90 capsule 1   UNABLE TO FIND Nervogen- 1 tablet in the morning and at night   vit C/E/Zn/coppr/lutein/zeaxan (PRESERVISION AREDS-2 ORAL) Take 1 capsule by mouth 2 (two) times daily   XYLITOL, BULK, MISC Use Use as directed 1 tablet in the mouth or throat at bedtime.   No current facility-administered medications for this visit.   Allergies: Sulfa (sulfonamide antibiotics)  Social and Family History  Social History reports that he quit smoking about 41 years ago. He started smoking about 71 years ago. He has a 15.00 pack-year smoking history. He has never used smokeless tobacco. He reports current alcohol use of about 7.0 - 10.0 standard drinks of alcohol per week. He reports that he does not use drugs.  Family History Family History  Problem Relation Age of Onset   Lung cancer Mother   Obesity Mother   Stroke Father   Macular degeneration Cousin   Diabetes type II Maternal Aunt   Diabetes type I Daughter   Diabetes type II Daughter   Glaucoma Neg Hx   Blindness Neg Hx   High blood pressure (Hypertension) Neg Hx    Cataracts Neg Hx   Thyroid disease Neg Hx   Vision loss Neg Hx   Review of Systems   Review of Systems: The patient denies chest pain, shortness of breath, orthopnea, paroxysmal nocturnal dyspnea, pedal edema, palpitations, heart racing, reports presyncope, syncope, with postural lightheadedness. Review of 12 Systems is negative except as described above.  Physical Examination   Vitals:BP 142/76   Pulse 67   Ht 190.5 cm (6\' 3" )   Wt 94.3 kg (208 lb)   SpO2 99%   BMI 26.00 kg/m  Ht:190.5 cm (6\' 3" ) Wt:94.3 kg (208 lb) KWI:OXBD surface area is 2.23 meters squared. Body mass index is 26 kg/m.  General: Alert and oriented. Well-appearing. No acute distress. HEENT: Pupils equally reactive to light and accomodation  Neck: Supple, no JVD Lungs: Normal effort of breathing; clear to auscultation bilaterally; no wheezes, rales, rhonchi Heart: Regular rate and rhythm. No murmur, rub, or gallop Incision site: Left upper chest wall, steri strips intact and removed, revealing well-healing insertion site without surrounding erythema, warmth, edema, or active drainage or bleeding. Abdomen: nondistended, with normal bowel sounds Extremities: no cyanosis, clubbing, or edema Peripheral Pulses: 2+ radial Skin: Warm, dry, no diaphoresis  Assessment   86 y.o. male with  1. Sinus pause  2. Need for vaccination  3. Sick sinus syndrome (CMS-HCC)  4. Essential hypertension  5. S/P placement of cardiac pacemaker  6. Status post placement of implantable loop recorder   86 year old gentleman with infrequent recurrent presyncopal episodes  with near loss of consciousness, with unremarkable previous 24-hour Holter monitor. Linq interrogation did not reveal episodes of atrial fibrillation, however it did show sinus pauses the longest lasting up to 5 seconds. The patient reported more frequent episodes of lightheadedness, near syncope, and generalized weakness. He underwent successful dual-chamber pacemaker  implantation on 08/03/2018 with overall clinical improvement. Patient has essential hypertension, blood pressure well controlled on diet therapy.   Plan   1. Continue current medications 2. Counseled patient about low-sodium diet 3. DASH diet printed instructions given to the patient 4. Pacemaker interrogation per [facility] 5. Return to clinic for follow-up in 6 months  No orders of the defined types were placed in this encounter.  Return in about 6 months (around 09/11/2020).  Michael Cowman, MD PhD [facility]   Electronically signed by Michael Cowman, MD at 03/14/2020 10:49 AM EDT  Plan of Treatment - documented as of this encounter Upcoming Encounters Upcoming Encounters  Date Type Specialty Care Team Description  04/11/2020 Office Visit Podiatry Yvetta Coder, [place]  [place], [place] [address]  [phone] (Work)  [phone] (Fax)    04/13/2020 Office Visit Orthopaedics Poggi, Smith Mince, MD  [place]  [facility] [place], [place] [address]  [phone] (Work)  [phone] (Fax)    05/29/2020 Procedure visit Cardiology Michael Cowman, MD  [place]  [facility]  [place], [place] [address]  [phone] (Work)  [phone] (Fax)    06/28/2020 Ancillary Orders Lab Harrold Donath, MD  [place]  [facility] [place], [place] [address]  [phone] (Work)  [phone] (Fax)    07/05/2020 Office Visit Internal Medicine Harrold Donath, MD  [place]  [facility] [place], [place] [address]  [phone] (Work)  [phone] (Fax)    02/14/2021 Office Visit Ophthalmology Janna Arch, [place]  [place], [place] [address]  [phone] (Work)  [phone] (Fax)    Goals - documented as of this encounter Goal Patient Goal Type Associated Problems Recent Progress  Patient-Stated? Author  Maintain health/healthy lifestyle  Lifestyle   No Francene Finders, LPN  Safe mobility / fall prevention  Lifestyle   No Francene Finders, LPN  Visit Diagnoses - documented in this encounter Diagnosis  Sinus pause - Primary   Need for vaccination  Need for prophylactic vaccination and inoculation against unspecified single disease   Sick sinus syndrome (CMS-HCC)  Sinoatrial node dysfunction   Essential hypertension   S/P placement of cardiac pacemaker   Status post placement of implantable loop recorder   Care Teams - documented as of this encounter Team Member Relationship Specialty Start Date End Date  Harrold Donath, MD  [facility] [place], [place] [address]  [phone] (Work)  [phone] (Fax)  PCP - General Internal Medicine 11/23/18   Images Patient Demographics  Patient Address Communication Language Race / Ethnicity Marital Status  [address] [facility]) [place], [place] [address]  Former (Sep 27, 2010 - Jan. 09, 2017): [address] [place]) [place], [place] [address] [phone] [facility]) [phone] (Home) [email or web address] English (Preferred) White / Not Hispanic or Latino Widowed  Patient Contacts  Contact Name Contact Address Communication Relationship to Patient  Doak Mah. Unknown [phone] (Home) [phone] (Mobile) [email or web address] Son or Daughter, Emergency Contact  Document Information  Primary Care Provider Other Service Providers Document Coverage Dates  Caleen Essex  III, MD (Jul. 14, 2020July 14, 2020 - Present) [phone] (Work) [phone] (Fax) [facility] [place], [place] [address] Internal Medicine [facility] [address] [place], [place] [address]  Nov. 03, 2021November 03, [place] [address] [place], [place]  [address]   Encounter Providers Encounter Date  Michael Cowman, MD (Attending) [phone] (Work) [phone] (Fax) [address] [facility] [place],  [address] Cardiovascular Disease Nov. 03, 2021November 03, 2021   Legal Authenticator   Daneil Dan    Show All Sections

## 2020-03-20 ENCOUNTER — Other Ambulatory Visit: Payer: Self-pay

## 2020-03-20 ENCOUNTER — Encounter
Admission: RE | Admit: 2020-03-20 | Discharge: 2020-03-20 | Disposition: A | Discharge status: Home / Self Care | Payer: Medicare Other | Source: Ambulatory Visit | Attending: Urology | Admitting: Urology

## 2020-03-20 LAB — CULTURE, URINE COMPREHENSIVE

## 2020-03-20 NOTE — Patient Instructions (Signed)
INSTRUCTIONS FOR SURGERY     Your surgery is scheduled for:   Monday, November 15TH     To find out your arrival time for the day of surgery,          please call [phone] between 1 pm and 3 pm on :  Friday, November 12TH     When you arrive for surgery, report to the [facility].       Do NOT stop on the first floor to register.  CHECK IN AT THE SURGICAL DESK.    REMEMBER: Instructions that are not followed completely may result in serious medical risk,  up to and including death, or upon the discretion of your surgeon and anesthesiologist,            your surgery may need to be rescheduled.  __X__ 1. Do not eat food after midnight the night before your procedure.                    No gum, candy, lozenger, tic tacs, tums or hard candies.                  ABSOLUTELY NOTHING SOLID IN YOUR MOUTH AFTER MIDNIGHT (no XYLITOL tabs)                    You may drink unlimited clear liquids up to 2 hours before you are scheduled to arrive for surgery.                   Do not drink anything within those 2 hours unless you need to take medicine, then take the                   smallest amount you need.  Clear liquids include:  water, apple juice without pulp,                   any flavor Gatorade, Black coffee, black tea.  Sugar may be added but no dairy/ honey /lemon.                        Broth and jello is not considered a clear liquid.  __x__  2. On the morning of surgery, please brush your teeth with toothpaste and water. You may rinse with                  mouthwash if you wish but DO NOT SWALLOW TOOTHPASTE OR MOUTHWASH  __X___3. NO alcohol for 24 hours before or after surgery.  __x___ 4.  Do NOT smoke or use e-cigarettes for 24 HOURS PRIOR TO SURGERY.                      DO NOT Use any chewable tobacco products for at least 6 hours prior to surgery.  __x___ 5. If you start any new medication after this  appointment and prior to surgery, please                   Bring it with you on the day of surgery.  ___x__  6. Notify your doctor if there is any change in your medical condition, such as fever,                   infection, vomitting, diarrhea or any open sores.  __x___ 7.  USE ANTIBACTERIAL SOAP as instructed, the night before surgery OR the day of surgery.                   Once you have washed with this soap, do NOT use any of the following: Powders, perfumes                    or lotions. Please do not wear make up, hairpins, clips or nail polish. You MAY wear deodorant.                   Men may shave their face and neck.  Women need to shave 48 hours prior to surgery.                   DO NOT wear ANY jewelry on the day of surgery. If there are rings that are too tight to                    remove easily, please address this prior to the surgery day. Piercings need to be removed.                                                                     NO METAL ON YOUR BODY.                     Do NOT bring any valuables.  If you came to Pre-Admit testing then you will not need license,                     insurance card or credit card.  If you will be staying overnight, please either leave your things in                     the car or have your family be responsible for these items.                      [facility] IS NOT RESPONSIBLE FOR BELONGINGS OR VALUABLES.  ___X__ 8. DO NOT wear contact lenses on surgery day.  You may not have dentures,                     Hearing aides, contacts or glasses in the operating room. These items can be                    Placed in the Recovery Room to receive immediately after surgery.  __x___ 9. IF YOU ARE SCHEDULED TO GO HOME ON THE SAME DAY, YOU MUST                   Have someone to drive you home and to stay with you  for the first 24 hours.                    Have an arrangement prior to arriving  on surgery day.  ___x__ 10. Take the following  medications on the morning of surgery with a sip of water:                              1. ALLOPURINOL                     2. ZYRTEC                     3. GABAPENTIN                     4. PRILOSEC(extra dose night before surgery)                     5. SINUS CARE SPRAY                     6.  _____ 11.  Follow any instructions provided to you by your surgeon.                        Such as enema, clear liquid bowel prep  __X__  12. STOP ALL ASPIRIN PRODUCTS AS OF TODAY, 03/20/20                       THIS INCLUDES BC POWDERS / GOODIES POWDER  __x___ 13. STOP Anti-inflammatories as of TODAY, 03/20/20                      This includes IBUPROFEN / MOTRIN / ADVIL / ALEVE/ NAPROXYN                    YOU MAY TAKE TYLENOL ANY TIME PRIOR TO SURGERY.  __X___ 14.  Stop supplements until after surgery.                     This includes: BIOTIN // PRESERVISION AREDS                 __X__17.  Continue to take the following medications but do not take on the morning of surgery:                     COLACE  ___X___18.  Wear clean and comfortable clothing to the hospital.

## 2020-03-21 ENCOUNTER — Other Ambulatory Visit: Payer: Medicare Other

## 2020-03-22 ENCOUNTER — Other Ambulatory Visit: Payer: Self-pay

## 2020-03-22 ENCOUNTER — Other Ambulatory Visit
Admission: RE | Admit: 2020-03-22 | Discharge: 2020-03-22 | Disposition: A | Discharge status: Home / Self Care | Payer: Medicare Other | Source: Ambulatory Visit | Attending: Urology | Admitting: Urology

## 2020-03-22 DIAGNOSIS — Z20822 Contact with and (suspected) exposure to covid-19: Secondary | ICD-10-CM | POA: Insufficient documentation

## 2020-03-22 DIAGNOSIS — Z01812 Encounter for preprocedural laboratory examination: Secondary | ICD-10-CM | POA: Diagnosis present

## 2020-03-22 LAB — SARS CORONAVIRUS 2 (TAT 6-24 HRS): SARS Coronavirus 2: NEGATIVE

## 2020-03-26 ENCOUNTER — Ambulatory Visit: Payer: Medicare Other

## 2020-03-26 ENCOUNTER — Other Ambulatory Visit: Payer: Self-pay

## 2020-03-26 ENCOUNTER — Ambulatory Visit: Payer: Medicare Other | Admitting: Urgent Care

## 2020-03-26 ENCOUNTER — Ambulatory Visit
Admission: RE | Admit: 2020-03-26 | Discharge: 2020-03-26 | Disposition: A | Discharge status: Home / Self Care | Payer: Medicare Other | Attending: Urology | Admitting: Urology

## 2020-03-26 ENCOUNTER — Encounter: Payer: Self-pay | Admitting: Urology

## 2020-03-26 ENCOUNTER — Encounter
Admission: RE | Disposition: A | Discharge status: Home / Self Care | Payer: Self-pay | Source: Home / Self Care | Attending: Urology

## 2020-03-26 DIAGNOSIS — Z950 Presence of cardiac pacemaker: Secondary | ICD-10-CM | POA: Insufficient documentation

## 2020-03-26 DIAGNOSIS — Z8551 Personal history of malignant neoplasm of bladder: Secondary | ICD-10-CM | POA: Diagnosis not present

## 2020-03-26 DIAGNOSIS — Z87891 Personal history of nicotine dependence: Secondary | ICD-10-CM | POA: Insufficient documentation

## 2020-03-26 DIAGNOSIS — C671 Malignant neoplasm of dome of bladder: Secondary | ICD-10-CM | POA: Insufficient documentation

## 2020-03-26 DIAGNOSIS — D494 Neoplasm of unspecified behavior of bladder: Secondary | ICD-10-CM | POA: Diagnosis present

## 2020-03-26 DIAGNOSIS — C672 Malignant neoplasm of lateral wall of bladder: Secondary | ICD-10-CM | POA: Diagnosis not present

## 2020-03-26 HISTORY — PX: CYSTOSCOPY W/ RETROGRADES: SHX1426

## 2020-03-26 HISTORY — PX: TRANSURETHRAL RESECTION OF BLADDER TUMOR WITH MITOMYCIN-C: SHX6459

## 2020-03-26 SURGERY — TRANSURETHRAL RESECTION OF BLADDER TUMOR WITH MITOMYCIN-C
Anesthesia: General | Site: Ureter

## 2020-03-26 MED ORDER — CEFAZOLIN SODIUM-DEXTROSE 2-4 GM/100ML-% IV SOLN
INTRAVENOUS | Status: AC
  Filled 2020-03-26: qty 100

## 2020-03-26 MED ORDER — CEFAZOLIN SODIUM-DEXTROSE 2-4 GM/100ML-% IV SOLN
2.0000 g | INTRAVENOUS | Status: AC
  Administered 2020-03-26: 2 g via INTRAVENOUS

## 2020-03-26 MED ORDER — ONDANSETRON HCL 4 MG/2ML IJ SOLN: 4.0000 mg | Freq: Once | INTRAMUSCULAR | Status: DC | PRN

## 2020-03-26 MED ORDER — CHLORHEXIDINE GLUCONATE 0.12 % MT SOLN: 15.0000 mL | Freq: Once | OROMUCOSAL | Status: AC

## 2020-03-26 MED ORDER — GLYCOPYRROLATE 0.2 MG/ML IJ SOLN
INTRAMUSCULAR | Status: DC | PRN
  Administered 2020-03-26: .2 mg via INTRAVENOUS

## 2020-03-26 MED ORDER — LACTATED RINGERS IV SOLN: INTRAVENOUS | Status: DC | PRN

## 2020-03-26 MED ORDER — FENTANYL CITRATE (PF) 100 MCG/2ML IJ SOLN: 25.0000 ug | INTRAMUSCULAR | Status: DC | PRN

## 2020-03-26 MED ORDER — LACTATED RINGERS IV SOLN: INTRAVENOUS | Status: DC

## 2020-03-26 MED ORDER — LIDOCAINE HCL (CARDIAC) PF 100 MG/5ML IV SOSY
PREFILLED_SYRINGE | INTRAVENOUS | Status: DC | PRN
  Administered 2020-03-26: 100 mg via INTRAVENOUS

## 2020-03-26 MED ORDER — CHLORHEXIDINE GLUCONATE 0.12 % MT SOLN
OROMUCOSAL | Status: AC
  Administered 2020-03-26: 15 mL via OROMUCOSAL
  Filled 2020-03-26: qty 15

## 2020-03-26 MED ORDER — PROPOFOL 10 MG/ML IV BOLUS
INTRAVENOUS | Status: DC | PRN
  Administered 2020-03-26: 150 mg via INTRAVENOUS

## 2020-03-26 MED ORDER — DEXAMETHASONE SODIUM PHOSPHATE 10 MG/ML IJ SOLN
INTRAMUSCULAR | Status: DC | PRN
  Administered 2020-03-26: 10 mg via INTRAVENOUS

## 2020-03-26 MED ORDER — FENTANYL CITRATE (PF) 100 MCG/2ML IJ SOLN
INTRAMUSCULAR | Status: DC | PRN
  Administered 2020-03-26: 25 ug via INTRAVENOUS
  Administered 2020-03-26: 50 ug via INTRAVENOUS
  Administered 2020-03-26: 25 ug via INTRAVENOUS

## 2020-03-26 MED ORDER — FENTANYL CITRATE (PF) 100 MCG/2ML IJ SOLN
INTRAMUSCULAR | Status: AC
  Filled 2020-03-26: qty 2

## 2020-03-26 MED ORDER — GEMCITABINE CHEMO FOR BLADDER INSTILLATION 2000 MG
INTRAVENOUS | Status: DC | PRN
  Administered 2020-03-26: 2000 mg via INTRAVESICAL

## 2020-03-26 MED ORDER — ORAL CARE MOUTH RINSE: 15.0000 mL | Freq: Once | OROMUCOSAL | Status: AC

## 2020-03-26 MED ORDER — ONDANSETRON HCL 4 MG/2ML IJ SOLN
INTRAMUSCULAR | Status: DC | PRN
  Administered 2020-03-26: 4 mg via INTRAVENOUS

## 2020-03-26 MED ORDER — IOHEXOL 180 MG/ML  SOLN
INTRAMUSCULAR | Status: DC | PRN
  Administered 2020-03-26: 20 mL

## 2020-03-26 SURGICAL SUPPLY — 38 items
BAG DRAIN CYSTO-URO LG1000N (MISCELLANEOUS) ×3 IMPLANT
BAG DRN RND TRDRP ANRFLXCHMBR (UROLOGICAL SUPPLIES) ×2
BAG URINE DRAIN 2000ML AR STRL (UROLOGICAL SUPPLIES) ×3 IMPLANT
BRUSH SCRUB EZ  4% CHG (MISCELLANEOUS) ×3
BRUSH SCRUB EZ 1% IODOPHOR (MISCELLANEOUS) ×3 IMPLANT
BRUSH SCRUB EZ 4% CHG (MISCELLANEOUS) ×2 IMPLANT
CATH FOLEY 2WAY  5CC 16FR (CATHETERS) ×3
CATH FOLEY 2WAY 5CC 16FR (CATHETERS) ×2
CATH URETL 5X70 OPEN END (CATHETERS) ×3 IMPLANT
CATH URTH 16FR FL 2W BLN LF (CATHETERS) ×2 IMPLANT
DRAPE UTILITY 15X26 TOWEL STRL (DRAPES) ×3 IMPLANT
DRSG TELFA 4X3 1S NADH ST (GAUZE/BANDAGES/DRESSINGS) ×3 IMPLANT
ELECT LOOP 22F BIPOLAR SML (ELECTROSURGICAL)
ELECT REM PT RETURN 9FT ADLT (ELECTROSURGICAL)
ELECTRODE LOOP 22F BIPOLAR SML (ELECTROSURGICAL) IMPLANT
ELECTRODE REM PT RTRN 9FT ADLT (ELECTROSURGICAL) IMPLANT
GLOVE BIO SURGEON STRL SZ 6.5 (GLOVE) ×3 IMPLANT
GOWN STRL REUS W/ TWL LRG LVL3 (GOWN DISPOSABLE) ×4 IMPLANT
GOWN STRL REUS W/TWL LRG LVL3 (GOWN DISPOSABLE) ×6
GUIDEWIRE STR DUAL SENSOR (WIRE) ×3 IMPLANT
IV NS IRRIG 3000ML ARTHROMATIC (IV SOLUTION) IMPLANT
KIT TURNOVER CYSTO (KITS) ×3 IMPLANT
LOOP CUT BIPOLAR 24F LRG (ELECTROSURGICAL) IMPLANT
MANIFOLD NEPTUNE II (INSTRUMENTS) ×3 IMPLANT
NDL SAFETY ECLIPSE 18X1.5 (NEEDLE) ×2 IMPLANT
NEEDLE HYPO 18GX1.5 SHARP (NEEDLE) ×3
PACK CYSTO AR (MISCELLANEOUS) ×3 IMPLANT
PAD ARMBOARD 7.5X6 YLW CONV (MISCELLANEOUS) ×3 IMPLANT
SET CYSTO W/LG BORE CLAMP LF (SET/KITS/TRAYS/PACK) ×3 IMPLANT
SET IRRIG Y TYPE TUR BLADDER L (SET/KITS/TRAYS/PACK) ×3 IMPLANT
SOL .9 NS 3000ML IRR  AL (IV SOLUTION) ×3
SOL .9 NS 3000ML IRR AL (IV SOLUTION) ×2
SOL .9 NS 3000ML IRR UROMATIC (IV SOLUTION) ×2 IMPLANT
SURGILUBE 2OZ TUBE FLIPTOP (MISCELLANEOUS) ×3 IMPLANT
SYR TOOMEY IRRIG 70ML (MISCELLANEOUS) ×3
SYRINGE TOOMEY IRRIG 70ML (MISCELLANEOUS) ×2 IMPLANT
WATER STERILE IRR 1000ML POUR (IV SOLUTION) ×3 IMPLANT
WATER STERILE IRR 3000ML UROMA (IV SOLUTION) IMPLANT

## 2020-03-26 NOTE — Interval H&P Note (Signed)
History and Physical Interval Note:  03/26/2020 10:12 AM  Michael Holloway  has presented today for surgery, with the diagnosis of bladder cancer.  The various methods of treatment have been discussed with the patient and family. After consideration of risks, benefits and other options for treatment, the patient has consented to  Procedure(s): TRANSURETHRAL RESECTION OF BLADDER TUMOR WITH gemcitabine (N/A) CYSTOSCOPY WITH RETROGRADE PYELOGRAM (Bilateral) as a surgical intervention.  The patient's history has been reviewed, patient examined, no change in status, stable for surgery.  I have reviewed the patient's chart and labs.  Questions were answered to the patient's satisfaction.    RRR CTAB  Hollice Espy

## 2020-03-26 NOTE — Transfer of Care (Signed)
Immediate Anesthesia Transfer of Care Note  Patient: Rowland Lathe  Procedure(s) Performed: TRANSURETHRAL RESECTION OF BLADDER TUMOR WITH gemcitabine (N/A Bladder) CYSTOSCOPY WITH RETROGRADE PYELOGRAM (Bilateral Ureter)  Patient Location: PACU  Anesthesia Type:General  Level of Consciousness: awake, alert  and patient cooperative  Airway & Oxygen Therapy: Patient Spontanous Breathing and Patient connected to face mask oxygen  Post-op Assessment: Report given to RN and Post -op Vital signs reviewed and stable  Post vital signs: Reviewed and stable  Last Vitals:  Vitals Value Taken Time  BP 109/67 03/26/20 1120  Temp 36.3 C 03/26/20 1120  Pulse 62 03/26/20 1132  Resp 16 03/26/20 1132  SpO2 100 % 03/26/20 1132  Vitals shown include unvalidated device data.  Last Pain:  Vitals:   03/26/20 1120  TempSrc:   PainSc: 0-No pain         Complications: No complications documented.

## 2020-03-26 NOTE — Anesthesia Preprocedure Evaluation (Signed)
Anesthesia Evaluation  Patient identified by MRN, date of birth, ID band Patient awake    Reviewed: Allergy & Precautions, H&P , NPO status , Patient's Chart, lab work & pertinent test results  History of Anesthesia Complications Negative for: history of anesthetic complications  Airway Mallampati: III  TM Distance: >3 FB Neck ROM: limited    Dental  (+) Chipped, Poor Dentition   Pulmonary neg pulmonary ROS, neg shortness of breath, neg recent URI, former smoker,    Pulmonary exam normal        Cardiovascular Exercise Tolerance: Good hypertension, (-) angina(-) Past MI, (-) Cardiac Stents and (-) DOE Normal cardiovascular exam+ dysrhythmias + pacemaker (-) Valvular Problems/Murmurs     Neuro/Psych  Neuromuscular disease negative psych ROS   GI/Hepatic negative GI ROS, Neg liver ROS, GERD  Controlled and Medicated,  Endo/Other  negative endocrine ROS  Renal/GU CRFRenal disease     Musculoskeletal  (+) Arthritis ,   Abdominal   Peds  Hematology negative hematology ROS (+)   Anesthesia Other Findings Past Medical History: No date: Arthritis No date: Cancer (HCC)     Comment:  Basal Cell Skin Cancer No date: Chronic kidney disease     Comment:  stage 111 No date: Dysrhythmia No date: GERD (gastroesophageal reflux disease) No date: Gout No date: History of kidney stones No date: Hypertension No date: Neuropathy No date: Spinal stenosis  Past Surgical History: No date: BACK SURGERY No date: CERVICAL SPINE SURGERY No date: CHOLECYSTECTOMY 08/25/2016: CYSTOSCOPY W/ RETROGRADES; Bilateral     Comment:  Procedure: CYSTOSCOPY WITH RETROGRADE PYELOGRAM;                Surgeon: Hollice Espy, MD;  Location: [facility] ORS;                Service: Urology;  Laterality: Bilateral; 07/18/2019: CYSTOSCOPY W/ RETROGRADES; Bilateral     Comment:  Procedure: CYSTOSCOPY WITH RETROGRADE PYELOGRAM;                Surgeon: Hollice Espy, MD;  Location: [facility] ORS;                Service: Urology;  Laterality: Bilateral; No date: EYE SURGERY; Bilateral     Comment:  Cataract Extraction with IOL 06/03/2017: LOOP RECORDER INSERTION; N/A     Comment:  Procedure: LOOP RECORDER INSERTION;  Surgeon: Isaias Cowman, MD;  Location: [facility] CV [facility];  Service:              Cardiovascular;  Laterality: N/A; No date: LUMBAR LAMINECTOMY 08/03/2018: PACEMAKER INSERTION; Left     Comment:  Procedure: INSERTION PACEMAKER DUALCHAMBER;  Surgeon:               Isaias Cowman, MD;  Location: [facility] ORS;  Service:               Cardiovascular;  Laterality: Left; 1990s: PROSTATE SURGERY     Comment:  Dr. Eliberto Ivory ,in office procedure No date: TONSILLECTOMY 08/25/2016: TRANSURETHRAL RESECTION OF BLADDER TUMOR WITH MITOMYCIN-C;  N/A     Comment:  Procedure: TRANSURETHRAL RESECTION OF BLADDER TUMOR WITH              MITOMYCIN-C;  Surgeon: Hollice Espy, MD;  Location:               [facility] ORS;  Service: Urology;  Laterality: N/A; 07/18/2019: TRANSURETHRAL RESECTION OF BLADDER TUMOR WITH MITOMYCIN-C;  N/A     Comment:  Procedure: TRANSURETHRAL RESECTION OF BLADDER TUMOR WITH              gemcitabine;  Surgeon: Hollice Espy, MD;  Location:               [facility] ORS;  Service: Urology;  Laterality: N/A; 06/14/2018: TRANSURETHRAL RESECTION OF PROSTATE; N/A     Comment:  Procedure: TRANSURETHRAL RESECTION OF THE PROSTATE               (TURP) with Gemcitabine;  Surgeon: Hollice Espy, MD;                Location: [facility] ORS;  Service: Urology;  Laterality: N/A;  BMI    Body Mass Index: 27.02 kg/m      Reproductive/Obstetrics negative OB ROS                             Anesthesia Physical  Anesthesia Plan  ASA: III  Anesthesia Plan: General   Post-op Pain Management:    Induction: Intravenous  PONV Risk Score and Plan: 2 and Ondansetron, Dexamethasone and Treatment may vary due to age  or medical condition  Airway Management Planned: LMA  Additional Equipment:   Intra-op Plan:   Post-operative Plan: Extubation in OR  Informed Consent: I have reviewed the patients History and Physical, chart, labs and discussed the procedure including the risks, benefits and alternatives for the proposed anesthesia with the patient or authorized representative who has indicated his/her understanding and acceptance.     Dental Advisory Given  Plan Discussed with: Anesthesiologist, CRNA and Surgeon  Anesthesia Plan Comments: (Patient reports no bleeding problems and no anticoagulant use.  Plan for spinal with backup GA  Patient consented for risks of anesthesia including but not limited to:  - adverse reactions to medications - damage to eyes, teeth, lips or other oral mucosa - nerve damage due to positioning  - risk of bleeding, infection, nerve damage and headache - risk of failed spinal - damage to teeth, lips or other oral mucosa - sore throat or hoarseness - damage to heart, brain, nerves, lungs or loss of life  Patient voiced understanding.)        Anesthesia Quick Evaluation

## 2020-03-26 NOTE — Op Note (Signed)
Date of procedure: 03/26/20  Preoperative diagnosis:  1. History of recurrent bladder cancer 2. Right lateral wall bladder tumor  Postoperative diagnosis:  1. Same as above 2. Bladder tumor, dome  Procedure: 1. Cystoscopy 2. Bilateral retrograde pyelogram 3. TURBT, medium 4. Instillation of intravesical gemcitabine  Surgeon: Hollice Espy, MD  Anesthesia: General  Complications: None  Intraoperative findings: Papillary carpeting involving the right lateral bladder wall extending towards bladder neck, at least 2.5 cm x 2 cm area in diameter.  There is also a small area of carpeting at the dome, less than 1 cm.  Bilateral retrograde unremarkable.  EBL: Minimal  Specimens: Bladder tumor, carpeting-like appearance of right lateral bladder wall and dome  Drains: 16 French Foley catheter  Indication: Michael Holloway is a 86 y.o. patient with recurrent bladder cancer presenting with recurrence.  After reviewing the management options for treatment, he elected to proceed with the above surgical procedure(s). We have discussed the potential benefits and risks of the procedure, side effects of the proposed treatment, the likelihood of the patient achieving the goals of the procedure, and any potential problems that might occur during the procedure or recuperation. Informed consent has been obtained.  Description of procedure:  The patient was taken to the operating room and general anesthesia was induced.  The patient was placed in the dorsal lithotomy position, prepped and draped in the usual sterile fashion, and preoperative antibiotics were administered. A preoperative time-out was performed.   A 21 French scope was advanced per urethra into the bladder.  The bladder was carefully inspected.  There was a papillary carpeting of a wide broad area in the right lateral bladder wall adjacent to the bladder neck which wrapped around anteriorly.  This is relatively broad-based rather than a  heaped up discrete tumor.  Measured least 2.5 cm x 2 cm in diameter.  Inspection of the main of the bladder did reveal some very subtle papillary-like areas on the dome with additional satellite areas, less than 1 cm.  No other heaped up bladder tumor was identified.  Attention was first turned to the right ureteral orifice which cannulated using a 5 [place] open-ended ureteral catheter.  Gentle retrograde pyelogram on the side revealed no hydroureteronephrosis or filling defects.  This same procedure was found on the left side which was also unremarkable without hydroureteronephrosis or filling defects.  Next, the use cold cup biopsy forceps to biopsy an area on the dome as well as some representative areas in the right lateral wall.  To the extent of this lesion, entire resection of this area with cold cup was not possible.  I exchanged the rigid cystoscope for a 26 French resectoscope and using the small bipolar loop, was able to resect the carpeting area on the right lateral bladder wall.  Adjacent areas were also fulgurated extensively with destructive purposes.  The dome was also extensively fulgurated in the area that was remotely abnormal to produce the risk of recurrence.  Bladder was inspected one additional time.  Hemostasis was excellent.  There is no other suspicious areas throughout the bladder.  Bladder was then drained.  A 16 French Foley catheter was placed and the balloon was filled with 10 cc of sterile water.  The patient was then cleaned and dried, repositioned in supine position, wrist myesthesia and taken the PACU in stable condition.  2000 mg of intravesical gemcitabine was instilled to the bladder.  Was allowed to dwell in the PACU for 1 hour.  After 1  hour, the chemotherapeutic agent was drained and the catheter was removed.  I will call him with his pathology results.  Have him return to the office in a few weeks to discuss alternatives given that he continues to have recurrences  despite induction BCG most recently.  All questions answered.   Hollice Espy, M.D.

## 2020-03-26 NOTE — Discharge Instructions (Signed)
Transurethral Resection of Bladder Tumor (TURBT) or Bladder Biopsy ° ° °Definition: ° Transurethral Resection of the Bladder Tumor is a surgical procedure used to diagnose and remove tumors within the bladder. TURBT is the most common treatment for early stage bladder cancer. ° °General instructions: °   ° Your recent bladder surgery requires very little post hospital care but some definite precautions. ° °Despite the fact that no skin incisions were used, the area around the bladder incisions are raw and covered with scabs to promote healing and prevent bleeding. Certain precautions are needed to insure that the scabs are not disturbed over the next 2-4 weeks while the healing proceeds. ° °Because the raw surface inside your bladder and the irritating effects of urine you may expect frequency of urination and/or urgency (a stronger desire to urinate) and perhaps even getting up at night more often. This will usually resolve or improve slowly over the healing period. You may see some blood in your urine over the first 6 weeks. Do not be alarmed, even if the urine was clear for a while. Get off your feet and drink lots of fluids until clearing occurs. If you start to pass clots or don't improve call us. ° °Diet: ° °You may return to your normal diet immediately. Because of the raw surface of your bladder, alcohol, spicy foods, foods high in acid and drinks with caffeine may cause irritation or frequency and should be used in moderation. To keep your urine flowing freely and avoid constipation, drink plenty of fluids during the day (8-10 glasses). Tip: Avoid cranberry juice because it is very acidic. ° °Activity: ° °Your physical activity doesn't need to be restricted. However, if you are very active, you may see some blood in the urine. We suggest that you reduce your activity under the circumstances until the bleeding has stopped. ° °Bowels: ° °It is important to keep your bowels regular during the postoperative  period. Straining with bowel movements can cause bleeding. A bowel movement every other day is reasonable. Use a mild laxative if needed, such as milk of magnesia 2-3 tablespoons, or 2 Dulcolax tablets. Call if you continue to have problems. If you had been taking narcotics for pain, before, during or after your surgery, you may be constipated. Take a laxative if necessary. ° ° ° °Medication: ° °You should resume your pre-surgery medications unless told not to. In addition you may be given an antibiotic to prevent or treat infection. Antibiotics are not always necessary. All medication should be taken as prescribed until the bottles are finished unless you are having an unusual reaction to one of the drugs. ° ° °[facility] °[place], [place] [address] °[phone] ° ° ° °AMBULATORY SURGERY  °DISCHARGE INSTRUCTIONS ° ° °1) The drugs that you were given will stay in your system until tomorrow so for the next 24 hours you should not: ° °A) Drive an automobile °B) Make any legal decisions °C) Drink any alcoholic beverage ° ° °2) You may resume regular meals tomorrow.  Today it is better to start with liquids and gradually work up to solid foods. ° °You may eat anything you prefer, but it is better to start with liquids, then soup and crackers, and gradually work up to solid foods. ° ° °3) Please notify your doctor immediately if you have any unusual bleeding, trouble breathing, redness and pain at the surgery site, drainage, fever, or pain not relieved by medication. ° ° ° °4) Additional Instructions: ° ° ° ° ° ° ° °  Please contact your physician with any problems or Same Day Surgery at [phone], Monday through Friday 6 am to 4 pm, or [place] [facility] [place] [facility] number at [phone]. ° °

## 2020-03-26 NOTE — Anesthesia Postprocedure Evaluation (Signed)
Anesthesia Post Note  Patient: Michael Holloway  Procedure(s) Performed: TRANSURETHRAL RESECTION OF BLADDER TUMOR WITH gemcitabine (N/A Bladder) CYSTOSCOPY WITH RETROGRADE PYELOGRAM (Bilateral Ureter)  Patient location during evaluation: PACU Anesthesia Type: General Level of consciousness: awake and alert Pain management: pain level controlled Vital Signs Assessment: post-procedure vital signs reviewed and stable Respiratory status: spontaneous breathing, nonlabored ventilation, respiratory function stable and patient connected to nasal cannula oxygen Cardiovascular status: blood pressure returned to baseline and stable Postop Assessment: no apparent nausea or vomiting Anesthetic complications: no   No complications documented.   Last Vitals:  Vitals:   03/26/20 1220 03/26/20 1250  BP: (!) 147/76 137/80  Pulse: 61 80  Resp: 12 18  Temp: 36.4 C (!) 36.1 C  SpO2: 100% 100%    Last Pain:  Vitals:   03/26/20 1250  TempSrc: Temporal  PainSc: 0-No pain                 Molli Barrows

## 2020-03-26 NOTE — Anesthesia Procedure Notes (Signed)
Procedure Name: Intubation Performed by: Kelton Pillar, CRNA Pre-anesthesia Checklist: Patient identified, Emergency Drugs available, Suction available and Patient being monitored Patient Re-evaluated:Patient Re-evaluated prior to induction Oxygen Delivery Method: Circle system utilized Preoxygenation: Pre-oxygenation with 100% oxygen Induction Type: IV induction Ventilation: Mask ventilation without difficulty LMA: LMA inserted LMA Size: 4.0 Tube type: Oral Number of attempts: 1 Airway Equipment and Method: Oral airway Placement Confirmation: positive ETCO2,  breath sounds checked- equal and bilateral and CO2 detector Tube secured with: Tape Dental Injury: Teeth and Oropharynx as per pre-operative assessment

## 2020-03-27 LAB — SURGICAL PATHOLOGY

## 2020-04-11 ENCOUNTER — Other Ambulatory Visit: Payer: Self-pay

## 2020-04-11 ENCOUNTER — Ambulatory Visit: Payer: Medicare Other | Admitting: Urology

## 2020-04-11 ENCOUNTER — Encounter: Payer: Self-pay | Admitting: Urology

## 2020-04-11 ENCOUNTER — Ambulatory Visit (INDEPENDENT_AMBULATORY_CARE_PROVIDER_SITE_OTHER): Payer: Medicare Other | Admitting: Urology

## 2020-04-11 VITALS — BP 135/72 | HR 82 | Ht 75.0 in | Wt 207.0 lb

## 2020-04-11 DIAGNOSIS — I6523 Occlusion and stenosis of bilateral carotid arteries: Secondary | ICD-10-CM | POA: Diagnosis not present

## 2020-04-11 DIAGNOSIS — C671 Malignant neoplasm of dome of bladder: Secondary | ICD-10-CM

## 2020-04-11 NOTE — Progress Notes (Signed)
04/11/2020 9:33 AM   Michael Holloway Dec 18, 1928 [id number]  Referring provider: Kirk Ruths, MD [facility] [place],  [place] [address]  Chief Complaint  Patient presents with   Post-op Follow-up    discuss results    HPI: 86 y.o. male with recurrent urothelial carcinoma who returns today to discuss postop results as well as management of frequent recurrences.  In 03/2017, he was noted to have small recurrences most consistent with low-grade noninvasive lesions. He is now undergone 3 office procedures for an office fulguration for low-grade superficial appearing tumors including on 03/31/2017, 05/2016, and 11/2017.   Most recent upper tract imaging in the form of CT abdomen and pelvis without contrast on 3/18 which was unremarkable. Bilateral retrograde pyelogram in the operating room of 08/2016 also negative.  He returned to the operating room on 06/2018 for repeat TURBT for small tumors involving the prostatic urethra and bladder neck, small. Tumors were primarily low-grade with some high-grade features, superficial. Muscularis propria was present but not involved. Intravesical gemcitabine was instilled.  He additional low-grade appearing recurrence at the dome and underwent office fulguration of these 01/2019  Patient underwent BCG and full inductio course in 05/2019.   He underwent a small TURBT on 07/18/2019.Carpeting of low-grade appearing papillary tumor at dome of bladder, just to the right of previously fulgurated area in the office, measuring approximately 1 cm. There is a few small satellite lesions as well ranging from 1 to 2 mm. Unremarkable retrograde pyelogram.  He had another recurrence and return to the operating room on 03/26/2020.  Papillary carpeting involving the right lateral bladder wall extending towards bladder neck, at least 2.5 cm x 2 cm area in diameter.  There is also a small area of carpeting at the dome,  less than 1 cm.  Bilateral retrograde unremarkable.  Chemotherapy was instilled postoperatively, gemcitabine.  Surgical pathology consistent with recurrent low-grade noninvasive disease.  He does smoking history, 3/4 ppd x 20 years. Quit in 1980.    No postoperative issues.  No dysuria or gross hematuria.  PMH: Past Medical History:  Diagnosis Date   Arthritis    Cancer ([place])    Basal Cell Skin Cancer   Chronic kidney disease    Stage 3 CKD   Dysrhythmia    GERD (gastroesophageal reflux disease)    Gout    History of kidney stones    Hypertension    Neuropathy    Presence of permanent cardiac pacemaker 2020   Spinal stenosis     Surgical History: Past Surgical History:  Procedure Laterality Date   BACK SURGERY     CERVICAL SPINE SURGERY      X 2   CHOLECYSTECTOMY     CYSTOSCOPY W/ RETROGRADES Bilateral 08/25/2016   Procedure: CYSTOSCOPY WITH RETROGRADE PYELOGRAM;  Surgeon: Hollice Espy, MD;  Location: [facility];  Service: Urology;  Laterality: Bilateral;   CYSTOSCOPY W/ RETROGRADES Bilateral 07/18/2019   Procedure: CYSTOSCOPY WITH RETROGRADE PYELOGRAM;  Surgeon: Hollice Espy, MD;  Location: [facility];  Service: Urology;  Laterality: Bilateral;   CYSTOSCOPY W/ RETROGRADES Bilateral 03/26/2020   Procedure: CYSTOSCOPY WITH RETROGRADE PYELOGRAM;  Surgeon: Hollice Espy, MD;  Location: [facility];  Service: Urology;  Laterality: Bilateral;   EYE SURGERY Bilateral    Cataract Extraction with IOL   JOINT REPLACEMENT Bilateral    total knee replacements   LOOP RECORDER INSERTION N/A 06/03/2017   Procedure: LOOP RECORDER INSERTION;  Surgeon: Isaias Cowman, MD;  Location: [facility];  Service: Cardiovascular;  Laterality: N/A;   LUMBAR LAMINECTOMY      X 4   PACEMAKER INSERTION Left 08/03/2018   Procedure: INSERTION PACEMAKER DUALCHAMBER;  Surgeon: Isaias Cowman, MD;  Location: [facility];  Service: Cardiovascular;  Laterality: Left;    PROSTATE SURGERY  1990s   Dr. Eliberto Ivory ,in office procedure   TONSILLECTOMY     TOTAL KNEE ARTHROPLASTY Right 09/29/2019   Procedure: TOTAL KNEE ARTHROPLASTY;  Surgeon: Corky Mull, MD;  Location: [facility];  Service: Orthopedics;  Laterality: Right;   TRANSURETHRAL RESECTION OF BLADDER TUMOR WITH MITOMYCIN-C N/A 08/25/2016   Procedure: TRANSURETHRAL RESECTION OF BLADDER TUMOR WITH MITOMYCIN-C;  Surgeon: Hollice Espy, MD;  Location: [facility];  Service: Urology;  Laterality: N/A;   TRANSURETHRAL RESECTION OF BLADDER TUMOR WITH MITOMYCIN-C N/A 07/18/2019   Procedure: TRANSURETHRAL RESECTION OF BLADDER TUMOR WITH gemcitabine;  Surgeon: Hollice Espy, MD;  Location: [facility];  Service: Urology;  Laterality: N/A;   TRANSURETHRAL RESECTION OF BLADDER TUMOR WITH MITOMYCIN-C N/A 03/26/2020   Procedure: TRANSURETHRAL RESECTION OF BLADDER TUMOR WITH gemcitabine;  Surgeon: Hollice Espy, MD;  Location: [facility];  Service: Urology;  Laterality: N/A;   TRANSURETHRAL RESECTION OF PROSTATE N/A 06/14/2018   Procedure: TRANSURETHRAL RESECTION OF THE PROSTATE (TURP) with Gemcitabine;  Surgeon: Hollice Espy, MD;  Location: [facility];  Service: Urology;  Laterality: N/A;    Home Medications:  Allergies as of 04/11/2020      Reactions   Sulfa Antibiotics Rash      Medication List       Accurate as of April 11, 2020  9:33 AM. If you have any questions, ask your nurse or doctor.        STOP taking these medications   gentamicin ointment 0.1 % Commonly known as: GARAMYCIN Stopped by: Hollice Espy, MD     TAKE these medications   allopurinol 100 MG tablet Commonly known as: ZYLOPRIM Take 100 mg by mouth daily.   aspirin EC 81 MG tablet Take 81 mg by mouth daily.   Biotin 10 MG Caps Take 10 mg by mouth daily.   CENTRUM ADULTS PO Take 1 tablet by mouth daily. What changed: Another medication with the same name was removed. Continue taking this medication, and follow the directions you  see here. Changed by: Hollice Espy, MD   cetirizine 10 MG tablet Commonly known as: ZYRTEC Take 10 mg by mouth every morning.   gabapentin 600 MG tablet Commonly known as: NEURONTIN Take 600 mg by mouth See admin instructions. Take 600 mg by mouth in the morning and afternoon and 900 mg at bedtime   omeprazole 20 MG capsule Commonly known as: PRILOSEC Take 20 mg by mouth every morning.   STOOL SOFTENER PO Take 1 tablet by mouth daily.   traMADol 50 MG tablet Commonly known as: ULTRAM Take 50 mg by mouth 3 (three) times daily as needed for moderate pain.   XLEAR SINUS CARE SPRAY NA Place 1 spray into the nose daily.   XYLIMELTS MT Use as directed 2 tablets in the mouth or throat at bedtime.       Allergies:  Allergies  Allergen Reactions   Sulfa Antibiotics Rash    Family History: Family History  Problem Relation Age of Onset   Bladder Cancer Neg Hx    Kidney cancer Neg Hx    Prostate cancer Neg Hx     Social History:  reports that he quit smoking about 41  years ago. His smoking use included cigarettes. He has a 15.00 pack-year smoking history. He has never used smokeless tobacco. He reports previous alcohol use. He reports that he does not use drugs.   Physical Exam: BP 135/72    Pulse 82    Ht 6\' 3"  (1.905 m)    Wt 207 lb (93.9 kg)    BMI 25.87 kg/m   Constitutional:  Alert and oriented, No acute distress. HEENT: [place] AT, moist mucus membranes.  Trachea midline, no masses. Cardiovascular: No clubbing, cyanosis, or edema. Respiratory: Normal respiratory effort, no increased work of breathing. Skin: No rashes, bruises or suspicious lesions. Neurologic: Grossly intact, no focal deficits, moving all 4 extremities. Psychiatric: Normal mood and affect.    Assessment & Plan:    1. 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  We discussed the risk including risk of bleeding, bladder infection amongst others.  All questions were answered.  This will need to be performed at the cancer center to which she is agreeable.  All questions answered.  We will have him return in about 3 months for his next surveillance cystoscopy. - Ambulatory referral to [place], MD  [facility] [address], [place], [place] [address] [phone]

## 2020-04-17 ENCOUNTER — Inpatient Hospital Stay: Payer: Medicare Other

## 2020-04-17 ENCOUNTER — Inpatient Hospital Stay: Payer: Medicare Other | Attending: Oncology | Admitting: Oncology

## 2020-04-17 ENCOUNTER — Other Ambulatory Visit: Payer: Self-pay

## 2020-04-17 ENCOUNTER — Encounter: Payer: Self-pay | Admitting: Oncology

## 2020-04-17 VITALS — BP 134/77 | HR 88 | Temp 98.8°F | Resp 18 | Wt 205.2 lb

## 2020-04-17 DIAGNOSIS — Z7982 Long term (current) use of aspirin: Secondary | ICD-10-CM | POA: Insufficient documentation

## 2020-04-17 DIAGNOSIS — C678 Malignant neoplasm of overlapping sites of bladder: Secondary | ICD-10-CM | POA: Diagnosis present

## 2020-04-17 DIAGNOSIS — Z809 Family history of malignant neoplasm, unspecified: Secondary | ICD-10-CM | POA: Insufficient documentation

## 2020-04-17 DIAGNOSIS — M109 Gout, unspecified: Secondary | ICD-10-CM | POA: Insufficient documentation

## 2020-04-17 DIAGNOSIS — Z79899 Other long term (current) drug therapy: Secondary | ICD-10-CM | POA: Insufficient documentation

## 2020-04-17 DIAGNOSIS — N183 Chronic kidney disease, stage 3 unspecified: Secondary | ICD-10-CM | POA: Diagnosis not present

## 2020-04-17 DIAGNOSIS — Z87891 Personal history of nicotine dependence: Secondary | ICD-10-CM | POA: Diagnosis not present

## 2020-04-17 DIAGNOSIS — Z9049 Acquired absence of other specified parts of digestive tract: Secondary | ICD-10-CM | POA: Insufficient documentation

## 2020-04-17 DIAGNOSIS — Z85828 Personal history of other malignant neoplasm of skin: Secondary | ICD-10-CM | POA: Diagnosis not present

## 2020-04-17 DIAGNOSIS — Z950 Presence of cardiac pacemaker: Secondary | ICD-10-CM | POA: Insufficient documentation

## 2020-04-17 DIAGNOSIS — K219 Gastro-esophageal reflux disease without esophagitis: Secondary | ICD-10-CM | POA: Insufficient documentation

## 2020-04-17 DIAGNOSIS — Z5111 Encounter for antineoplastic chemotherapy: Secondary | ICD-10-CM | POA: Insufficient documentation

## 2020-04-17 DIAGNOSIS — Z87442 Personal history of urinary calculi: Secondary | ICD-10-CM | POA: Diagnosis not present

## 2020-04-17 DIAGNOSIS — Z9079 Acquired absence of other genital organ(s): Secondary | ICD-10-CM | POA: Insufficient documentation

## 2020-04-17 DIAGNOSIS — I10 Essential (primary) hypertension: Secondary | ICD-10-CM | POA: Diagnosis not present

## 2020-04-17 DIAGNOSIS — Z823 Family history of stroke: Secondary | ICD-10-CM | POA: Diagnosis not present

## 2020-04-17 DIAGNOSIS — C679 Malignant neoplasm of bladder, unspecified: Secondary | ICD-10-CM | POA: Insufficient documentation

## 2020-04-17 DIAGNOSIS — Z7189 Other specified counseling: Secondary | ICD-10-CM

## 2020-04-17 NOTE — Progress Notes (Signed)
Hematology/Oncology Consult note [facility] Telephone:([phone] [phone]   Patient Care Team: Kirk Ruths, MD as PCP - General (Internal Medicine)  REFERRING PROVIDER: Hollice Espy, MD  CHIEF COMPLAINTS/REASON FOR VISIT:  Evaluation of noninvasive bladder cancer.  HISTORY OF PRESENTING ILLNESS:   Michael Holloway is a  86 y.o.  male with PMH listed below was seen in consultation at the request of  Hollice Espy, MD  for evaluation of noninvasive bladder cancer  Patient has a history of recurrent urothelial carcinoma and follows up with Dr. Erlene Quan.  Previously received BCG He has underwent multiple procedures including TURBT for low-grade nonmuscle invasive bladder cancer. His most recent recurrence was found on 03/26/2020, per urology Dr. Cherrie Gauze note, papillary carpeting involving the right lateral bladder wall extending towards bladder neck, at least 2.5 x 2 cm. In diameter. There is also carpeting at the dome. Less than 1 cm. Bilateral retrograde unremarkable.  Chemotherapy was instilled postoperatively, gemcitabine.  03/26/2020 Pathology showed non invasive papillary urothelia carcinoma, low grade. Muscularis propria present and uninvolved.  Given that patient has had high frequency and recurrence despite induction BCG, patient was referred to hematology oncology to establish care for evaluation of intravesical gemcitabine weekly x6 to help to reduce the frequency and severity of the recurrence.  Patient reports feeling well.  He has experienced mild hematuria after recent cystoscopy and procedure.  Symptoms are getting better. Denies any dysuria, fever, chills. Appetite is fair.  Lives with his son Mild chronic fatigue , Review of Systems  Constitutional: Positive for fatigue. Negative for appetite change, chills, fever and unexpected weight change.  HENT:   Negative for hearing loss and voice change.   Eyes: Negative for eye  problems and icterus.  Respiratory: Negative for chest tightness, cough and shortness of breath.   Cardiovascular: Negative for chest pain and leg swelling.  Gastrointestinal: Negative for abdominal distention and abdominal pain.  Endocrine: Negative for hot flashes.  Genitourinary: Negative for difficulty urinating, dysuria and frequency.   Musculoskeletal: Negative for arthralgias.  Skin: Negative for itching and rash.  Neurological: Negative for light-headedness and numbness.  Hematological: Negative for adenopathy. Does not bruise/bleed easily.  Psychiatric/Behavioral: Negative for confusion.    MEDICAL HISTORY:  Past Medical History:  Diagnosis Date  . Allergy   . Arthritis   . Bladder cancer ([place])   . Cancer ([place])    Basal Cell Skin Cancer  . Cataract   . Chronic kidney disease    Stage 3 CKD  . Dysrhythmia   . GERD (gastroesophageal reflux disease)   . Gout   . History of kidney stones   . Hypertension   . Neuropathy   . Presence of permanent cardiac pacemaker 2020  . Spinal stenosis     SURGICAL HISTORY: Past Surgical History:  Procedure Laterality Date  . BACK SURGERY    . CERVICAL SPINE SURGERY      X 2  . CHOLECYSTECTOMY    . CYSTOSCOPY W/ RETROGRADES Bilateral 08/25/2016   Procedure: CYSTOSCOPY WITH RETROGRADE PYELOGRAM;  Surgeon: Hollice Espy, MD;  Location: [facility];  Service: Urology;  Laterality: Bilateral;  . CYSTOSCOPY W/ RETROGRADES Bilateral 07/18/2019   Procedure: CYSTOSCOPY WITH RETROGRADE PYELOGRAM;  Surgeon: Hollice Espy, MD;  Location: [facility];  Service: Urology;  Laterality: Bilateral;  . CYSTOSCOPY W/ RETROGRADES Bilateral 03/26/2020   Procedure: CYSTOSCOPY WITH RETROGRADE PYELOGRAM;  Surgeon: Hollice Espy, MD;  Location: [facility];  Service: Urology;  Laterality: Bilateral;  . EYE  SURGERY Bilateral    Cataract Extraction with IOL  . JOINT REPLACEMENT Bilateral    total knee replacements  . LOOP RECORDER INSERTION N/A 06/03/2017    Procedure: LOOP RECORDER INSERTION;  Surgeon: Isaias Cowman, MD;  Location: [facility];  Service: Cardiovascular;  Laterality: N/A;  . LUMBAR LAMINECTOMY      X 4  . PACEMAKER INSERTION Left 08/03/2018   Procedure: INSERTION PACEMAKER DUALCHAMBER;  Surgeon: Isaias Cowman, MD;  Location: [facility];  Service: Cardiovascular;  Laterality: Left;  . PROSTATE SURGERY  1990s   Dr. Eliberto Ivory ,in office procedure  . TONSILLECTOMY    . TOTAL KNEE ARTHROPLASTY Right 09/29/2019   Procedure: TOTAL KNEE ARTHROPLASTY;  Surgeon: Corky Mull, MD;  Location: [facility];  Service: Orthopedics;  Laterality: Right;  . TRANSURETHRAL RESECTION OF BLADDER TUMOR WITH MITOMYCIN-C N/A 08/25/2016   Procedure: TRANSURETHRAL RESECTION OF BLADDER TUMOR WITH MITOMYCIN-C;  Surgeon: Hollice Espy, MD;  Location: [facility];  Service: Urology;  Laterality: N/A;  . TRANSURETHRAL RESECTION OF BLADDER TUMOR WITH MITOMYCIN-C N/A 07/18/2019   Procedure: TRANSURETHRAL RESECTION OF BLADDER TUMOR WITH gemcitabine;  Surgeon: Hollice Espy, MD;  Location: [facility];  Service: Urology;  Laterality: N/A;  . TRANSURETHRAL RESECTION OF BLADDER TUMOR WITH MITOMYCIN-C N/A 03/26/2020   Procedure: TRANSURETHRAL RESECTION OF BLADDER TUMOR WITH gemcitabine;  Surgeon: Hollice Espy, MD;  Location: [facility];  Service: Urology;  Laterality: N/A;  . TRANSURETHRAL RESECTION OF PROSTATE N/A 06/14/2018   Procedure: TRANSURETHRAL RESECTION OF THE PROSTATE (TURP) with Gemcitabine;  Surgeon: Hollice Espy, MD;  Location: [facility];  Service: Urology;  Laterality: N/A;    SOCIAL HISTORY: Social History   Socioeconomic History  . Marital status: Widowed    Spouse name: Not on file  . Number of children: Not on file  . Years of education: Not on file  . Highest education level: Not on file  Occupational History  . Occupation: Programmer, applications  Tobacco Use  . Smoking status: Former Smoker    Packs/day: 0.50    Years:  30.00    Pack years: 15.00    Types: Cigarettes    Quit date: 08/11/1978    Years since quitting: 41.7  . Smokeless tobacco: Never Used  Vaping Use  . Vaping Use: Never used  Substance and Sexual Activity  . Alcohol use: Not Currently    Comment: 1 BEER DAILY  . Drug use: No  . Sexual activity: Not Currently  Other Topics Concern  . Not on file  Social History Narrative   Patient lives with his son, Michael Holloway.  He feels safe in his home.   Very active. Goes to the [employer] regularly, works on his computer and putters around his home.   Independant   Social Determinants of Health   Financial Resource Strain:   . Difficulty of Paying Living Expenses: Not on file  Food Insecurity:   . Worried About Charity fundraiser in the Last Year: Not on file  . Ran Out of Food in the Last Year: Not on file  Transportation Needs:   . Lack of Transportation (Medical): Not on file  . Lack of Transportation (Non-Medical): Not on file  Physical Activity:   . Days of Exercise per Week: Not on file  . Minutes of Exercise per Session: Not on file  Stress:   . Feeling of Stress : Not on file  Social Connections:   . Frequency of Communication with Friends and Family: Not on file  .  Frequency of Social Gatherings with Friends and Family: Not on file  . Attends Religious Services: Not on file  . Active Member of Clubs or Organizations: Not on file  . Attends Archivist Meetings: Not on file  . Marital Status: Not on file  Intimate Partner Violence:   . Fear of Current or Ex-Partner: Not on file  . Emotionally Abused: Not on file  . Physically Abused: Not on file  . Sexually Abused: Not on file    FAMILY HISTORY: Family History  Problem Relation Age of Onset  . Cancer Mother   . Stroke Father   . Bladder Cancer Neg Hx   . Kidney cancer Neg Hx   . Prostate cancer Neg Hx     ALLERGIES:  is allergic to sulfa antibiotics.  MEDICATIONS:  Current Outpatient Medications  Medication Sig  Dispense Refill  . allopurinol (ZYLOPRIM) 100 MG tablet Take 100 mg by mouth daily.     Marland Kitchen aspirin EC 81 MG tablet Take 81 mg by mouth daily.    . Biotin 10 MG CAPS Take 10 mg by mouth daily.     . cetirizine (ZYRTEC) 10 MG tablet Take 10 mg by mouth every morning.     Mariane Baumgarten Calcium (STOOL SOFTENER PO) Take 1 tablet by mouth daily.     Marland Kitchen gabapentin (NEURONTIN) 600 MG tablet Take 600 mg by mouth See admin instructions. Take 600 mg by mouth in the morning and afternoon and 900 mg at bedtime    . omeprazole (PRILOSEC) 20 MG capsule Take 20 mg by mouth every morning.     . traMADol (ULTRAM) 50 MG tablet Take 50 mg by mouth 3 (three) times daily as needed for moderate pain.     . Xylitol (XYLIMELTS MT) Use as directed 2 tablets in the mouth or throat at bedtime.     . Multiple Vitamins-Minerals (CENTRUM ADULTS PO) Take 1 tablet by mouth daily.  (Patient not taking: Reported on 03/20/2020)    . Sodium Chloride-Xylitol (XLEAR SINUS CARE SPRAY NA) Place 1 spray into the nose daily. (Patient not taking: Reported on 04/17/2020)     Current Facility-Administered Medications  Medication Dose Route Frequency Provider Last Rate Last Admin  . gemcitabine (GEMZAR) 2,000 mg in sodium chloride irrigation 0.9 % chemo infusion  2,000 mg Irrigation Once Hollice Espy, MD      . gemcitabine [facility]) chemo syringe for bladder instillation 2,000 mg  2,000 mg Bladder Instillation Once Hollice Espy, MD      . gemcitabine [facility]) chemo syringe for bladder instillation 2,000 mg  2,000 mg Bladder Instillation Once Hollice Espy, MD      . lidocaine (XYLOCAINE) 2 % (with pres) injection 1,000 mg  50 mL Other Once Hollice Espy, MD      . lidocaine (XYLOCAINE) 2 % jelly 1 application  1 application Urethral Once Hollice Espy, MD         PHYSICAL EXAMINATION: ECOG PERFORMANCE STATUS: 1 - Symptomatic but completely ambulatory Vitals:   04/17/20 1515  BP: 134/77  Pulse: 88  Resp: 18  Temp: 98.8 F (37.1  C)  SpO2: 99%   Filed Weights   04/17/20 1515  Weight: 205 lb 4 oz (93.1 kg)    Physical Exam Constitutional:      General: He is not in acute distress.    Comments: Patient walks with a cane.  HENT:     Head: Normocephalic and atraumatic.  Eyes:     General: No  scleral icterus. Cardiovascular:     Rate and Rhythm: Normal rate and regular rhythm.     Heart sounds: Normal heart sounds.  Pulmonary:     Effort: Pulmonary effort is normal. No respiratory distress.     Breath sounds: No wheezing.  Abdominal:     General: Bowel sounds are normal. There is no distension.     Palpations: Abdomen is soft.  Musculoskeletal:        General: No deformity. Normal range of motion.     Cervical back: Normal range of motion and neck supple.  Skin:    General: Skin is warm and dry.     Findings: No erythema or rash.  Neurological:     Mental Status: He is alert and oriented to person, place, and time. Mental status is at baseline.     Cranial Nerves: No cranial nerve deficit.     Coordination: Coordination normal.  Psychiatric:        Mood and Affect: Mood normal.     LABORATORY DATA:  I have reviewed the data as listed Lab Results  Component Value Date   WBC 8.2 03/16/2020   HGB 13.0 03/16/2020   HCT 41.7 03/16/2020   MCV 88.2 03/16/2020   PLT 240 03/16/2020   Recent Labs    09/22/19 1038 09/22/19 1038 09/30/19 0353 10/01/19 0416 03/16/20 1129  NA 141   < > 141 139 144  K 4.5   < > 4.5 3.9 4.4  CL 107   < > 108 107 108  CO2 25   < > 26 25 28   GLUCOSE 113*   < > 107* 92 111*  BUN 29*   < > 24* 26* 23  CREATININE 1.81*   < > 1.66* 1.51* 1.51*  CALCIUM 9.1   < > 8.4* 8.4* 8.6*  GFRNONAA 33*   < > 36* 41* 44*  GFRAA 38*  --  42* 47*  --   PROT 7.0  --   --   --   --   ALBUMIN 3.9  --   --   --   --   AST 19  --   --   --   --   ALT 13  --   --   --   --   ALKPHOS 106  --   --   --   --   BILITOT 0.8  --   --   --   --    < > = values in this interval not  displayed.   Iron/TIBC/Ferritin/ %Sat No results found for: IRON, TIBC, FERRITIN, IRONPCTSAT    RADIOGRAPHIC STUDIES: I have personally reviewed the radiological images as listed and agreed with the findings in the report. DG OR UROLOGY CYSTO IMAGE ([place])  Result Date: 03/26/2020 There is no interpretation for this exam.  This order is for images obtained during a surgical procedure.  Please See "Surgeries" Tab for more information regarding the procedure.   07/24/2016  CT abdomen pelvis wo contrast Numerous bilateral cystic renal lesions, some with calcifications.These cannot be fully characterized without intravenous contrast.Punctate bilateral nephrolithiasis. Irregular bladder wall thickening with possible focal bladder wall mass posteriorly on the left. Cannot exclude bladder cancer. Recommend further evaluation with direct visualization given the patient's gross hematuria. 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 Diffuse aortic atherosclerosis. Cardiomegaly, coronary artery disease.   ASSESSMENT & PLAN:  1. Malignant neoplasm of overlapping sites of  bladder ([place])   2. Goals of care, counseling/discussion    Previous images, pathology reports were reviewed and discussed with patient. Patient has recurrent noninvasive bladder cancer. Urology recommendation was reviewed with patient.  Recommend weekly gemcitabine intravesical. I reviewed the rationale and potential side effects of treatment, including but not limited to decreased immunity, low blood counts, nausea vomiting diarrhea, increased risk of infection, perforation of the bladder, urinary symptoms etc. He voices understanding and is willing to proceed with the treatment.  Patient will receive chemotherapy education.  Hopefully to start treatment in the following 1 to 2 weeks   All questions were answered. The patient knows to call the clinic with any problems  questions or concerns.  cc Hollice Espy, MD    Thank you for this kind referral and the opportunity to participate in the care of this patient. A copy of today's note is routed to referring provider    Earlie Server, MD, PhD Hematology Oncology [facility] at [facility] Pager- [phone] 04/17/2020

## 2020-04-17 NOTE — Progress Notes (Signed)
DISCONTINUE ON PATHWAY REGIMEN - Bladder  No Medical Intervention - Off Treatment.  REASON: Other Reason PRIOR TREATMENT: Bladder41: Other Intravesical Therapy in Consultation with Urology  START OFF PATHWAY REGIMEN - Bladder   [id number]:Gemcitabine Intravesical 2,000 mg D1 q7 Days x 6 Cycles:   A cycle is every 7 days:     Gemcitabine   **Always confirm dose/schedule in your pharmacy ordering system**  Patient Characteristics: Pre-Cystectomy or Nonsurgical Candidate (Clinical Staging), High-Grade cTa, cN0 or cTis/cT1, cN0, No Prior Intravesical Therapy Therapeutic Status: Pre-Cystectomy or Nonsurgical Candidate (Clinical Staging) AJCC M Category: cM0 AJCC 8 Stage Grouping: 0a AJCC T Category: cTa AJCC N Category: cN0 Intent of Therapy: Curative Intent, Discussed with Patient

## 2020-04-17 NOTE — Progress Notes (Signed)
Bladder - No Medical Intervention - Off Treatment.  Patient Characteristics: Pre-Cystectomy or Nonsurgical Candidate (Clinical Staging), High-Grade cTa, cN0 or cTis/cT1, cN0, No Prior Intravesical Therapy Therapeutic Status: Pre-Cystectomy or Nonsurgical Candidate (Clinical Staging) AJCC M Category: cM0 AJCC 8 Stage Grouping: 0a AJCC T Category: cTa AJCC N Category: cN0

## 2020-04-19 NOTE — Patient Instructions (Signed)
Gemcitabine injection What is this medicine? GEMCITABINE (jem SYE ta been) is a chemotherapy drug. This medicine is used to treat many types of cancer like breast cancer, lung cancer, pancreatic cancer, and ovarian cancer. This medicine may be used for other purposes; ask your health care provider or pharmacist if you have questions. COMMON BRAND NAME(S): Gemzar, Infugem What should I tell my health care provider before I take this medicine? They need to know if you have any of these conditions:  blood disorders  infection  kidney disease  liver disease  lung or breathing disease, like asthma  recent or ongoing radiation therapy  an unusual or allergic reaction to gemcitabine, other chemotherapy, other medicines, foods, dyes, or preservatives  pregnant or trying to get pregnant  breast-feeding How should I use this medicine? This drug is given as an infusion into a vein. It is administered in a hospital or clinic by a specially trained health care professional. Talk to your pediatrician regarding the use of this medicine in children. Special care may be needed. Overdosage: If you think you have taken too much of this medicine contact a poison control center or emergency room at once. NOTE: This medicine is only for you. Do not share this medicine with others. What if I miss a dose? It is important not to miss your dose. Call your doctor or health care professional if you are unable to keep an appointment. What may interact with this medicine?  medicines to increase blood counts like filgrastim, pegfilgrastim, sargramostim  some other chemotherapy drugs like cisplatin  vaccines Talk to your doctor or health care professional before taking any of these medicines:  acetaminophen  aspirin  ibuprofen  ketoprofen  naproxen This list may not describe all possible interactions. Give your health care provider a list of all the medicines, herbs, non-prescription drugs, or  dietary supplements you use. Also tell them if you smoke, drink alcohol, or use illegal drugs. Some items may interact with your medicine. What should I watch for while using this medicine? Visit your doctor for checks on your progress. This drug may make you feel generally unwell. This is not uncommon, as chemotherapy can affect healthy cells as well as cancer cells. Report any side effects. Continue your course of treatment even though you feel ill unless your doctor tells you to stop. In some cases, you may be given additional medicines to help with side effects. Follow all directions for their use. Call your doctor or health care professional for advice if you get a fever, chills or sore throat, or other symptoms of a cold or flu. Do not treat yourself. This drug decreases your body's ability to fight infections. Try to avoid being around people who are sick. This medicine may increase your risk to bruise or bleed. Call your doctor or health care professional if you notice any unusual bleeding. Be careful brushing and flossing your teeth or using a toothpick because you may get an infection or bleed more easily. If you have any dental work done, tell your dentist you are receiving this medicine. Avoid taking products that contain aspirin, acetaminophen, ibuprofen, naproxen, or ketoprofen unless instructed by your doctor. These medicines may hide a fever. Do not become pregnant while taking this medicine or for 6 months after stopping it. Women should inform their doctor if they wish to become pregnant or think they might be pregnant. Men should not father a child while taking this medicine and for 3 months after stopping it.   There is a potential for serious side effects to an unborn child. Talk to your health care professional or pharmacist for more information. Do not breast-feed an infant while taking this medicine or for at least 1 week after stopping it. Men should inform their doctors if they wish  to father a child. This medicine may lower sperm counts. Talk with your doctor or health care professional if you are concerned about your fertility. What side effects may I notice from receiving this medicine? Side effects that you should report to your doctor or health care professional as soon as possible:  allergic reactions like skin rash, itching or hives, swelling of the face, lips, or tongue  breathing problems  pain, redness, or irritation at site where injected  signs and symptoms of a dangerous change in heartbeat or heart rhythm like chest pain; dizziness; fast or irregular heartbeat; palpitations; feeling faint or lightheaded, falls; breathing problems  signs of decreased platelets or bleeding - bruising, pinpoint red spots on the skin, black, tarry stools, blood in the urine  signs of decreased red blood cells - unusually weak or tired, feeling faint or lightheaded, falls  signs of infection - fever or chills, cough, sore throat, pain or difficulty passing urine  signs and symptoms of kidney injury like trouble passing urine or change in the amount of urine  signs and symptoms of liver injury like dark yellow or brown urine; general ill feeling or flu-like symptoms; light-colored stools; loss of appetite; nausea; right upper belly pain; unusually weak or tired; yellowing of the eyes or skin  swelling of ankles, feet, hands Side effects that usually do not require medical attention (report to your doctor or health care professional if they continue or are bothersome):  constipation  diarrhea  hair loss  loss of appetite  nausea  rash  vomiting This list may not describe all possible side effects. Call your doctor for medical advice about side effects. You may report side effects to FDA at 1-800-FDA-1088. Where should I keep my medicine? This drug is given in a hospital or clinic and will not be stored at home. NOTE: This sheet is a summary. It may not cover all  possible information. If you have questions about this medicine, talk to your doctor, pharmacist, or health care provider.  2020 Elsevier/Gold Standard (2017-07-22 18:06:11)  

## 2020-04-20 ENCOUNTER — Other Ambulatory Visit: Payer: Self-pay

## 2020-04-20 ENCOUNTER — Inpatient Hospital Stay (HOSPITAL_BASED_OUTPATIENT_CLINIC_OR_DEPARTMENT_OTHER): Payer: Medicare Other | Admitting: Oncology

## 2020-04-20 ENCOUNTER — Inpatient Hospital Stay: Payer: Medicare Other

## 2020-04-20 DIAGNOSIS — C678 Malignant neoplasm of overlapping sites of bladder: Secondary | ICD-10-CM

## 2020-04-25 ENCOUNTER — Inpatient Hospital Stay (HOSPITAL_BASED_OUTPATIENT_CLINIC_OR_DEPARTMENT_OTHER): Payer: Medicare Other | Admitting: Hospice and Palliative Medicine

## 2020-04-25 ENCOUNTER — Other Ambulatory Visit: Payer: Self-pay

## 2020-04-25 DIAGNOSIS — C678 Malignant neoplasm of overlapping sites of bladder: Secondary | ICD-10-CM

## 2020-04-25 NOTE — Progress Notes (Signed)
Multidisciplinary Oncology Council Documentation  Michael Holloway was presented by our [facility] on 04/25/2020, which included representatives from:   Palliative Care  Dietitian  Physical/Occupational Therapist  Speech Therapist  Nurse Navigator  Michael Holloway currently presents with history of noninvasive bladder cancer  We reviewed previous medical and familial history, history of present illness, and recent lab results along with all available histopathologic and imaging studies. The [place] considered available treatment options and made the following recommendations/referrals:  Monitor for weakness and consider referral to OT if needed  The MOC is a meeting of clinicians from various specialty areas who evaluate and discuss patients for whom a multidisciplinary approach is being considered. Final determinations in the plan of care are those of the provider(s).   Todays extended care, comprehensive team conference, Michael Holloway was not present for the discussion and was not examined.

## 2020-05-03 ENCOUNTER — Inpatient Hospital Stay (HOSPITAL_BASED_OUTPATIENT_CLINIC_OR_DEPARTMENT_OTHER): Payer: Medicare Other | Admitting: Oncology

## 2020-05-03 ENCOUNTER — Inpatient Hospital Stay: Payer: Medicare Other

## 2020-05-03 ENCOUNTER — Encounter: Payer: Self-pay | Admitting: Oncology

## 2020-05-03 VITALS — BP 101/65 | HR 76 | Temp 97.8°F | Resp 16 | Wt 204.1 lb

## 2020-05-03 DIAGNOSIS — C678 Malignant neoplasm of overlapping sites of bladder: Secondary | ICD-10-CM | POA: Diagnosis not present

## 2020-05-03 DIAGNOSIS — Z7189 Other specified counseling: Secondary | ICD-10-CM

## 2020-05-03 DIAGNOSIS — N1832 Chronic kidney disease, stage 3b: Secondary | ICD-10-CM

## 2020-05-03 DIAGNOSIS — Z5111 Encounter for antineoplastic chemotherapy: Secondary | ICD-10-CM | POA: Diagnosis not present

## 2020-05-03 LAB — URINALYSIS, COMPLETE (UACMP) WITH MICROSCOPIC
Bilirubin Urine: NEGATIVE
Glucose, UA: NEGATIVE mg/dL
Hgb urine dipstick: NEGATIVE
Ketones, ur: 5 mg/dL — AB
Nitrite: NEGATIVE
Protein, ur: 100 mg/dL — AB
Specific Gravity, Urine: 1.02 (ref 1.005–1.030)
WBC, UA: >50 WBC/hpf — ABNORMAL HIGH (ref 0–5)
pH: 5 (ref 5.0–8.0)

## 2020-05-03 LAB — CBC WITH DIFFERENTIAL/PLATELET
Abs Immature Granulocytes: 0.02 10*3/uL (ref 0.00–0.07)
Basophils Absolute: 0 10*3/uL (ref 0.0–0.1)
Basophils Relative: 1 %
Eosinophils Absolute: 0.3 10*3/uL (ref 0.0–0.5)
Eosinophils Relative: 4 %
HCT: 42.6 % (ref 39.0–52.0)
Hemoglobin: 13.9 g/dL (ref 13.0–17.0)
Immature Granulocytes: 0 %
Lymphocytes Relative: 19 %
Lymphs Abs: 1.4 10*3/uL (ref 0.7–4.0)
MCH: 28.6 pg (ref 26.0–34.0)
MCHC: 32.6 g/dL (ref 30.0–36.0)
MCV: 87.7 fL (ref 80.0–100.0)
Monocytes Absolute: 0.6 10*3/uL (ref 0.1–1.0)
Monocytes Relative: 9 %
Neutro Abs: 5 10*3/uL (ref 1.7–7.7)
Neutrophils Relative %: 67 %
Platelets: 244 10*3/uL (ref 150–400)
RBC: 4.86 MIL/uL (ref 4.22–5.81)
RDW: 15 % (ref 11.5–15.5)
WBC: 7.4 10*3/uL (ref 4.0–10.5)
nRBC: 0 % (ref 0.0–0.2)

## 2020-05-03 LAB — COMPREHENSIVE METABOLIC PANEL
ALT: 12 U/L (ref 0–44)
AST: 22 U/L (ref 15–41)
Albumin: 3.8 g/dL (ref 3.5–5.0)
Alkaline Phosphatase: 100 U/L (ref 38–126)
Anion gap: 11 (ref 5–15)
BUN: 28 mg/dL — ABNORMAL HIGH (ref 8–23)
CO2: 24 mmol/L (ref 22–32)
Calcium: 8.9 mg/dL (ref 8.9–10.3)
Chloride: 104 mmol/L (ref 98–111)
Creatinine, Ser: 1.97 mg/dL — ABNORMAL HIGH (ref 0.61–1.24)
GFR, Estimated: 32 mL/min — ABNORMAL LOW (ref >60)
Glucose, Bld: 103 mg/dL — ABNORMAL HIGH (ref 70–99)
Potassium: 4 mmol/L (ref 3.5–5.1)
Sodium: 139 mmol/L (ref 135–145)
Total Bilirubin: 0.8 mg/dL (ref 0.3–1.2)
Total Protein: 7.1 g/dL (ref 6.5–8.1)

## 2020-05-03 MED ORDER — PROCHLORPERAZINE MALEATE 10 MG PO TABS
10.0000 mg | ORAL_TABLET | Freq: Once | ORAL | Status: AC
  Administered 2020-05-03: 10 mg via ORAL
  Filled 2020-05-03: qty 1

## 2020-05-03 MED ORDER — GEMCITABINE CHEMO FOR BLADDER INSTILLATION 2000 MG
2000.0000 mg | Freq: Once | INTRAVENOUS | Status: AC
  Administered 2020-05-03: 2000 mg via INTRAVESICAL
  Filled 2020-05-03: qty 52.6

## 2020-05-03 NOTE — Progress Notes (Signed)
Patient denies new problems/concerns today.   °

## 2020-05-03 NOTE — Progress Notes (Signed)
Hematology/Oncology Consult note [facility] Telephone:([phone] [phone]   Patient Care Team: Kirk Ruths, MD as PCP - General (Internal Medicine)  REFERRING PROVIDER: Kirk Ruths, MD  CHIEF COMPLAINTS/REASON FOR VISIT:  Evaluation of noninvasive bladder cancer.  HISTORY OF PRESENTING ILLNESS:   Michael Holloway is a  85 y.o.  male with PMH listed below was seen in consultation at the request of  Kirk Ruths, MD  for evaluation of noninvasive bladder cancer  Patient has a history of recurrent urothelial carcinoma and follows up with Dr. Erlene Quan.  Previously received BCG He has underwent multiple procedures including TURBT for low-grade nonmuscle invasive bladder cancer. His most recent recurrence was found on 03/26/2020, per urology Dr. Cherrie Gauze note, papillary carpeting involving the right lateral bladder wall extending towards bladder neck, at least 2.5 x 2 cm. In diameter. There is also carpeting at the dome. Less than 1 cm. Bilateral retrograde unremarkable.  Chemotherapy was instilled postoperatively, gemcitabine.  03/26/2020 Pathology showed non invasive papillary urothelia carcinoma, low grade. Muscularis propria present and uninvolved.  Given that patient has had high frequency and recurrence despite induction BCG, patient was referred to hematology oncology to establish care for evaluation of intravesical gemcitabine weekly x6 to help to reduce the frequency and severity of the recurrence.   experienced mild hematuria after recent cystoscopy and procedure.  Symptoms are gettingbetter. Appetite is fair.  Lives with his son Mild chronic fatigue ,  INTERVAL HISTORY Michael Holloway is a 86 y.o. male who has above history reviewed by me today presents for follow up visit for evaluation prior to intravesical gemcitabine treatment. Problems and complaints are listed below: Patient reports no new complaints today.  He  has been to chemotherapy class.  Denies any urinary symptoms.  No fever or chills  Review of Systems  Constitutional: Positive for fatigue. Negative for appetite change, chills, fever and unexpected weight change.  HENT:   Negative for hearing loss and voice change.   Eyes: Negative for eye problems and icterus.  Respiratory: Negative for chest tightness, cough and shortness of breath.   Cardiovascular: Negative for chest pain and leg swelling.  Gastrointestinal: Negative for abdominal distention and abdominal pain.  Endocrine: Negative for hot flashes.  Genitourinary: Negative for difficulty urinating, dysuria and frequency.   Musculoskeletal: Negative for arthralgias.  Skin: Negative for itching and rash.  Neurological: Negative for light-headedness and numbness.  Hematological: Negative for adenopathy. Does not bruise/bleed easily.  Psychiatric/Behavioral: Negative for confusion.    MEDICAL HISTORY:  Past Medical History:  Diagnosis Date  . Allergy   . Arthritis   . Bladder cancer ([place])   . Cancer ([place])    Basal Cell Skin Cancer  . Cataract   . Chronic kidney disease    Stage 3 CKD  . Dysrhythmia   . GERD (gastroesophageal reflux disease)   . Gout   . History of kidney stones   . Hypertension   . Neuropathy   . Presence of permanent cardiac pacemaker 2020  . Spinal stenosis     SURGICAL HISTORY: Past Surgical History:  Procedure Laterality Date  . BACK SURGERY    . CERVICAL SPINE SURGERY      X 2  . CHOLECYSTECTOMY    . CYSTOSCOPY W/ RETROGRADES Bilateral 08/25/2016   Procedure: CYSTOSCOPY WITH RETROGRADE PYELOGRAM;  Surgeon: Hollice Espy, MD;  Location: [facility];  Service: Urology;  Laterality: Bilateral;  . CYSTOSCOPY W/ RETROGRADES Bilateral 07/18/2019   Procedure:  CYSTOSCOPY WITH RETROGRADE PYELOGRAM;  Surgeon: Hollice Espy, MD;  Location: [facility];  Service: Urology;  Laterality: Bilateral;  . CYSTOSCOPY W/ RETROGRADES Bilateral 03/26/2020   Procedure:  CYSTOSCOPY WITH RETROGRADE PYELOGRAM;  Surgeon: Hollice Espy, MD;  Location: [facility];  Service: Urology;  Laterality: Bilateral;  . EYE SURGERY Bilateral    Cataract Extraction with IOL  . JOINT REPLACEMENT Bilateral    total knee replacements  . LOOP RECORDER INSERTION N/A 06/03/2017   Procedure: LOOP RECORDER INSERTION;  Surgeon: Isaias Cowman, MD;  Location: [facility];  Service: Cardiovascular;  Laterality: N/A;  . LUMBAR LAMINECTOMY      X 4  . PACEMAKER INSERTION Left 08/03/2018   Procedure: INSERTION PACEMAKER DUALCHAMBER;  Surgeon: Isaias Cowman, MD;  Location: [facility];  Service: Cardiovascular;  Laterality: Left;  . PROSTATE SURGERY  1990s   Dr. Eliberto Ivory ,in office procedure  . TONSILLECTOMY    . TOTAL KNEE ARTHROPLASTY Right 09/29/2019   Procedure: TOTAL KNEE ARTHROPLASTY;  Surgeon: Corky Mull, MD;  Location: [facility];  Service: Orthopedics;  Laterality: Right;  . TRANSURETHRAL RESECTION OF BLADDER TUMOR WITH MITOMYCIN-C N/A 08/25/2016   Procedure: TRANSURETHRAL RESECTION OF BLADDER TUMOR WITH MITOMYCIN-C;  Surgeon: Hollice Espy, MD;  Location: [facility];  Service: Urology;  Laterality: N/A;  . TRANSURETHRAL RESECTION OF BLADDER TUMOR WITH MITOMYCIN-C N/A 07/18/2019   Procedure: TRANSURETHRAL RESECTION OF BLADDER TUMOR WITH gemcitabine;  Surgeon: Hollice Espy, MD;  Location: [facility];  Service: Urology;  Laterality: N/A;  . TRANSURETHRAL RESECTION OF BLADDER TUMOR WITH MITOMYCIN-C N/A 03/26/2020   Procedure: TRANSURETHRAL RESECTION OF BLADDER TUMOR WITH gemcitabine;  Surgeon: Hollice Espy, MD;  Location: [facility];  Service: Urology;  Laterality: N/A;  . TRANSURETHRAL RESECTION OF PROSTATE N/A 06/14/2018   Procedure: TRANSURETHRAL RESECTION OF THE PROSTATE (TURP) with Gemcitabine;  Surgeon: Hollice Espy, MD;  Location: [facility];  Service: Urology;  Laterality: N/A;    SOCIAL HISTORY: Social History   Socioeconomic History  . Marital status: Widowed     Spouse name: Not on file  . Number of children: Not on file  . Years of education: Not on file  . Highest education level: Not on file  Occupational History  . Occupation: Programmer, applications  Tobacco Use  . Smoking status: Former Smoker    Packs/day: 0.50    Years: 30.00    Pack years: 15.00    Types: Cigarettes    Quit date: 08/11/1978    Years since quitting: 41.7  . Smokeless tobacco: Never Used  Vaping Use  . Vaping Use: Never used  Substance and Sexual Activity  . Alcohol use: Not Currently    Comment: 1 BEER DAILY  . Drug use: No  . Sexual activity: Not Currently  Other Topics Concern  . Not on file  Social History Narrative   Patient lives with his son, Jaquez.  He feels safe in his home.   Very active. Goes to the [employer] regularly, works on his computer and putters around his home.   Independant   Social Determinants of Health   Financial Resource Strain: Not on file  Food Insecurity: Not on file  Transportation Needs: Not on file  Physical Activity: Not on file  Stress: Not on file  Social Connections: Not on file  Intimate Partner Violence: Not on file    FAMILY HISTORY: Family History  Problem Relation Age of Onset  . Cancer Mother   . Stroke Father   . Bladder Cancer  Neg Hx   . Kidney cancer Neg Hx   . Prostate cancer Neg Hx     ALLERGIES:  is allergic to sulfa antibiotics.  MEDICATIONS:  Current Outpatient Medications  Medication Sig Dispense Refill  . allopurinol (ZYLOPRIM) 100 MG tablet Take 100 mg by mouth daily.     Marland Kitchen aspirin EC 81 MG tablet Take 81 mg by mouth daily.    . Biotin 10 MG CAPS Take 10 mg by mouth daily.     . cetirizine (ZYRTEC) 10 MG tablet Take 10 mg by mouth every morning.     Tery Sanfilippo Calcium (STOOL SOFTENER PO) Take 1 tablet by mouth daily.     Marland Kitchen gabapentin (NEURONTIN) 600 MG tablet Take 600 mg by mouth See admin instructions. Take 600 mg by mouth in the morning and afternoon and 900 mg at bedtime    .  omeprazole (PRILOSEC) 20 MG capsule Take 20 mg by mouth every morning.     . traMADol (ULTRAM) 50 MG tablet Take 50 mg by mouth 3 (three) times daily as needed for moderate pain.     . Xylitol (XYLIMELTS MT) Use as directed 2 tablets in the mouth or throat at bedtime.    . Sodium Chloride-Xylitol (XLEAR SINUS CARE SPRAY NA) Place 1 spray into the nose daily. (Patient not taking: No sig reported)     Current Facility-Administered Medications  Medication Dose Route Frequency Provider Last Rate Last Admin  . gemcitabine (GEMZAR) 2,000 mg in sodium chloride irrigation 0.9 % chemo infusion  2,000 mg Irrigation Once Vanna Scotland, MD      . gemcitabine [facility]) chemo syringe for bladder instillation 2,000 mg  2,000 mg Bladder Instillation Once Vanna Scotland, MD      . gemcitabine [facility]) chemo syringe for bladder instillation 2,000 mg  2,000 mg Bladder Instillation Once Vanna Scotland, MD      . lidocaine (XYLOCAINE) 2 % (with pres) injection 1,000 mg  50 mL Other Once Vanna Scotland, MD      . lidocaine (XYLOCAINE) 2 % jelly 1 application  1 application Urethral Once Vanna Scotland, MD         PHYSICAL EXAMINATION: ECOG PERFORMANCE STATUS: 1 - Symptomatic but completely ambulatory Vitals:   05/03/20 0923  BP: 101/65  Pulse: 76  Resp: 16  Temp: 97.8 F (36.6 C)   Filed Weights   05/03/20 0923  Weight: 204 lb 1.6 oz (92.6 kg)    Physical Exam Constitutional:      General: He is not in acute distress.    Comments: Patient walks with a cane.  HENT:     Head: Normocephalic and atraumatic.  Eyes:     General: No scleral icterus. Cardiovascular:     Rate and Rhythm: Normal rate and regular rhythm.     Heart sounds: Normal heart sounds.  Pulmonary:     Effort: Pulmonary effort is normal. No respiratory distress.     Breath sounds: No wheezing.  Abdominal:     General: Bowel sounds are normal. There is no distension.     Palpations: Abdomen is soft.  Musculoskeletal:         General: No deformity. Normal range of motion.     Cervical back: Normal range of motion and neck supple.  Skin:    General: Skin is warm and dry.     Findings: No erythema or rash.  Neurological:     Mental Status: He is alert and oriented to person, place, and time.  Mental status is at baseline.     Cranial Nerves: No cranial nerve deficit.     Coordination: Coordination normal.  Psychiatric:        Mood and Affect: Mood normal.     LABORATORY DATA:  I have reviewed the data as listed Lab Results  Component Value Date   WBC 7.4 05/03/2020   HGB 13.9 05/03/2020   HCT 42.6 05/03/2020   MCV 87.7 05/03/2020   PLT 244 05/03/2020   Recent Labs    09/22/19 1038 09/30/19 0353 10/01/19 0416 03/16/20 1129 05/03/20 0847  NA 141 141 139 144 139  K 4.5 4.5 3.9 4.4 4.0  CL 107 108 107 108 104  CO2 25 26 25 28 24   GLUCOSE 113* 107* 92 111* 103*  BUN 29* 24* 26* 23 28*  CREATININE 1.81* 1.66* 1.51* 1.51* 1.97*  CALCIUM 9.1 8.4* 8.4* 8.6* 8.9  GFRNONAA 33* 36* 41* 44* 32*  GFRAA 38* 42* 47*  --   --   PROT 7.0  --   --   --  7.1  ALBUMIN 3.9  --   --   --  3.8  AST 19  --   --   --  22  ALT 13  --   --   --  12  ALKPHOS 106  --   --   --  100  BILITOT 0.8  --   --   --  0.8   Iron/TIBC/Ferritin/ %Sat No results found for: IRON, TIBC, FERRITIN, IRONPCTSAT    RADIOGRAPHIC STUDIES: I have personally reviewed the radiological images as listed and agreed with the findings in the report. No results found. 07/24/2016  CT abdomen pelvis wo contrast Numerous bilateral cystic renal lesions, some with calcifications.These cannot be fully characterized without intravenous contrast.Punctate bilateral nephrolithiasis. Irregular bladder wall thickening with possible focal bladder wall mass posteriorly on the left. Cannot exclude bladder cancer. Recommend further evaluation with direct visualization given the patient's gross hematuria. Ectatic aorta, 3.1 cm with probable focal chronic  dissection in the upper abdominal aorta. Diffuse aortic atherosclerosis. Cardiomegaly, coronary artery disease.   ASSESSMENT & PLAN:  1. Malignant neoplasm of overlapping sites of bladder ([place])   2. Goals of care, counseling/discussion   3. Stage 3b chronic kidney disease ([place])    Previous images, pathology reports were reviewed and discussed with patient. Labs reviewed and discussed with patient. Proceed with cycle 1 weekly gemcitabine.  #UA showed large leukocyte, negative nitrate, microscopic hematuria Clinically he is asymptomatic with no urinary tract symptoms. I will send the sample for culture.  May start him on antibiotics if cultures grow significant bacterial colonies.  #CKD, stage III Recommend patient to avoid nephrotoxins.  Creatinine level is slightly higher than his baseline.  I encourage patient to increase oral hydration.  Follow-up creatinine in 1 week.   All questions were answered. The patient knows to call the clinic with any problems questions or concerns. Follow-up in 1 week Earlie Server, MD, PhD Hematology Oncology [facility] Pager- [phone] 05/03/2020

## 2020-05-03 NOTE — Progress Notes (Signed)
1135: Per Janeann Merl RN per Dr. Tasia Catchings, UA has been reviewed and okay to proceed with treatment. (Protein 100, Leukocytes: large, WBC: >50, Bacteria: Rare).   1157: Foley clamped and Gemzar inserted.  1158: Foley unclamped, Pt reports "a lot of pressure".  1315: Foley removed. Pt tolerated procedure well.  Pt stable at discharge.

## 2020-05-07 ENCOUNTER — Telehealth: Payer: Self-pay

## 2020-05-07 NOTE — Telephone Encounter (Signed)
Telephone call to patient for follow up after receiving first infusion.   No answer but left message stating we were calling to check on them.  Encouraged patient to call for any questions or concerns.   

## 2020-05-10 ENCOUNTER — Inpatient Hospital Stay (HOSPITAL_BASED_OUTPATIENT_CLINIC_OR_DEPARTMENT_OTHER): Payer: Medicare Other | Admitting: Oncology

## 2020-05-10 ENCOUNTER — Inpatient Hospital Stay: Payer: Medicare Other

## 2020-05-10 ENCOUNTER — Other Ambulatory Visit: Payer: Self-pay

## 2020-05-10 ENCOUNTER — Encounter: Payer: Self-pay | Admitting: Oncology

## 2020-05-10 VITALS — BP 120/66 | HR 71 | Temp 97.9°F | Resp 18 | Wt 206.9 lb

## 2020-05-10 DIAGNOSIS — N1832 Chronic kidney disease, stage 3b: Secondary | ICD-10-CM

## 2020-05-10 DIAGNOSIS — C678 Malignant neoplasm of overlapping sites of bladder: Secondary | ICD-10-CM | POA: Diagnosis not present

## 2020-05-10 DIAGNOSIS — Z5111 Encounter for antineoplastic chemotherapy: Secondary | ICD-10-CM | POA: Diagnosis not present

## 2020-05-10 LAB — COMPREHENSIVE METABOLIC PANEL
ALT: 10 U/L (ref 0–44)
AST: 18 U/L (ref 15–41)
Albumin: 3.7 g/dL (ref 3.5–5.0)
Alkaline Phosphatase: 88 U/L (ref 38–126)
Anion gap: 12 (ref 5–15)
BUN: 28 mg/dL — ABNORMAL HIGH (ref 8–23)
CO2: 23 mmol/L (ref 22–32)
Calcium: 8.8 mg/dL — ABNORMAL LOW (ref 8.9–10.3)
Chloride: 105 mmol/L (ref 98–111)
Creatinine, Ser: 1.85 mg/dL — ABNORMAL HIGH (ref 0.61–1.24)
GFR, Estimated: 34 mL/min — ABNORMAL LOW (ref >60)
Glucose, Bld: 101 mg/dL — ABNORMAL HIGH (ref 70–99)
Potassium: 4.2 mmol/L (ref 3.5–5.1)
Sodium: 140 mmol/L (ref 135–145)
Total Bilirubin: 0.8 mg/dL (ref 0.3–1.2)
Total Protein: 7.1 g/dL (ref 6.5–8.1)

## 2020-05-10 LAB — CBC WITH DIFFERENTIAL/PLATELET
Abs Immature Granulocytes: 0.02 10*3/uL (ref 0.00–0.07)
Basophils Absolute: 0 10*3/uL (ref 0.0–0.1)
Basophils Relative: 1 %
Eosinophils Absolute: 0.2 10*3/uL (ref 0.0–0.5)
Eosinophils Relative: 3 %
HCT: 40.6 % (ref 39.0–52.0)
Hemoglobin: 13.3 g/dL (ref 13.0–17.0)
Immature Granulocytes: 0 %
Lymphocytes Relative: 21 %
Lymphs Abs: 1.2 10*3/uL (ref 0.7–4.0)
MCH: 28.4 pg (ref 26.0–34.0)
MCHC: 32.8 g/dL (ref 30.0–36.0)
MCV: 86.8 fL (ref 80.0–100.0)
Monocytes Absolute: 0.4 10*3/uL (ref 0.1–1.0)
Monocytes Relative: 8 %
Neutro Abs: 3.7 10*3/uL (ref 1.7–7.7)
Neutrophils Relative %: 67 %
Platelets: 211 10*3/uL (ref 150–400)
RBC: 4.68 MIL/uL (ref 4.22–5.81)
RDW: 14.5 % (ref 11.5–15.5)
WBC: 5.5 10*3/uL (ref 4.0–10.5)
nRBC: 0 % (ref 0.0–0.2)

## 2020-05-10 LAB — URINALYSIS, COMPLETE (UACMP) WITH MICROSCOPIC
Bacteria, UA: NONE SEEN
Bilirubin Urine: NEGATIVE
Glucose, UA: NEGATIVE mg/dL
Ketones, ur: NEGATIVE mg/dL
Nitrite: NEGATIVE
Protein, ur: 100 mg/dL — AB
RBC / HPF: >50 RBC/hpf — ABNORMAL HIGH (ref 0–5)
Specific Gravity, Urine: 1.017 (ref 1.005–1.030)
WBC, UA: >50 WBC/hpf — ABNORMAL HIGH (ref 0–5)
pH: 5 (ref 5.0–8.0)

## 2020-05-10 MED ORDER — GEMCITABINE CHEMO FOR BLADDER INSTILLATION 2000 MG
2000.0000 mg | Freq: Once | INTRAVENOUS | Status: AC
  Administered 2020-05-10: 2000 mg via INTRAVESICAL
  Filled 2020-05-10: qty 52.6

## 2020-05-10 MED ORDER — PROCHLORPERAZINE MALEATE 10 MG PO TABS
10.0000 mg | ORAL_TABLET | Freq: Once | ORAL | Status: AC
Start: 2020-05-10 — End: 2020-05-10
  Administered 2020-05-10: 10 mg via ORAL

## 2020-05-10 NOTE — Progress Notes (Signed)
Hematology/Oncology Consult note [facility] Telephone:([phone] [phone]   Patient Care Team: Kirk Ruths, MD as PCP - General (Internal Medicine)  REFERRING PROVIDER: Kirk Ruths, MD  CHIEF COMPLAINTS/REASON FOR VISIT:  Evaluation of noninvasive bladder cancer.  HISTORY OF PRESENTING ILLNESS:   Michael Holloway is a  86 y.o.  male with PMH listed below was seen in consultation at the request of  Kirk Ruths, MD  for evaluation of noninvasive bladder cancer  Patient has a history of recurrent urothelial carcinoma and follows up with Dr. Erlene Quan.  Previously received BCG He has underwent multiple procedures including TURBT for low-grade nonmuscle invasive bladder cancer. His most recent recurrence was found on 03/26/2020, per urology Dr. Cherrie Gauze note, papillary carpeting involving the right lateral bladder wall extending towards bladder neck, at least 2.5 x 2 cm. In diameter. There is also carpeting at the dome. Less than 1 cm. Bilateral retrograde unremarkable.  Chemotherapy was instilled postoperatively, gemcitabine.  03/26/2020 Pathology showed non invasive papillary urothelia carcinoma, low grade. Muscularis propria present and uninvolved.  Given that patient has had high frequency and recurrence despite induction BCG, patient was referred to hematology oncology to establish care for evaluation of intravesical gemcitabine weekly x6 to help to reduce the frequency and severity of the recurrence.   experienced mild hematuria after recent cystoscopy and procedure.  Symptoms are gettingbetter. Appetite is fair.  Lives with his son Mild chronic fatigue ,  INTERVAL HISTORY Michael Holloway is a 86 y.o. male who has above history reviewed by me today presents for follow up visit for evaluation prior to intravesical gemcitabine treatment. Problems and complaints are listed below: Patient reports episodes of feeling "  fainting", sometimes triggered by exercise, sometimes with no triggers. Occasionally he reports palpitation. He has a pacemaker and reports that he has had such sensations intermittently before he starts treatment for bladder cancer. Otherwise no new complaints. Denies any nausea vomiting diarrhea, fever or chills, dysuria. He noticed that his urine is pinkish sometimes.    Review of Systems  Constitutional: Positive for fatigue. Negative for appetite change, chills, fever and unexpected weight change.  HENT:   Negative for hearing loss and voice change.   Eyes: Negative for eye problems and icterus.  Respiratory: Negative for chest tightness, cough and shortness of breath.   Cardiovascular: Positive for palpitations. Negative for chest pain and leg swelling.  Gastrointestinal: Negative for abdominal distention and abdominal pain.  Endocrine: Negative for hot flashes.  Genitourinary: Negative for difficulty urinating, dysuria and frequency.   Musculoskeletal: Negative for arthralgias.  Skin: Negative for itching and rash.  Neurological: Negative for light-headedness and numbness.  Hematological: Negative for adenopathy. Does not bruise/bleed easily.  Psychiatric/Behavioral: Negative for confusion.    MEDICAL HISTORY:  Past Medical History:  Diagnosis Date  . Allergy   . Arthritis   . Bladder cancer ([place])   . Cancer ([place])    Basal Cell Skin Cancer  . Cataract   . Chronic kidney disease    Stage 3 CKD  . Dysrhythmia   . GERD (gastroesophageal reflux disease)   . Gout   . History of kidney stones   . Hypertension   . Neuropathy   . Presence of permanent cardiac pacemaker 2020  . Spinal stenosis     SURGICAL HISTORY: Past Surgical History:  Procedure Laterality Date  . BACK SURGERY    . CERVICAL SPINE SURGERY      X 2  .  CHOLECYSTECTOMY    . CYSTOSCOPY W/ RETROGRADES Bilateral 08/25/2016   Procedure: CYSTOSCOPY WITH RETROGRADE PYELOGRAM;  Surgeon: Hollice Espy, MD;   Location: [facility];  Service: Urology;  Laterality: Bilateral;  . CYSTOSCOPY W/ RETROGRADES Bilateral 07/18/2019   Procedure: CYSTOSCOPY WITH RETROGRADE PYELOGRAM;  Surgeon: Hollice Espy, MD;  Location: [facility];  Service: Urology;  Laterality: Bilateral;  . CYSTOSCOPY W/ RETROGRADES Bilateral 03/26/2020   Procedure: CYSTOSCOPY WITH RETROGRADE PYELOGRAM;  Surgeon: Hollice Espy, MD;  Location: [facility];  Service: Urology;  Laterality: Bilateral;  . EYE SURGERY Bilateral    Cataract Extraction with IOL  . JOINT REPLACEMENT Bilateral    total knee replacements  . LOOP RECORDER INSERTION N/A 06/03/2017   Procedure: LOOP RECORDER INSERTION;  Surgeon: Isaias Cowman, MD;  Location: [place] [facility];  Service: Cardiovascular;  Laterality: N/A;  . LUMBAR LAMINECTOMY      X 4  . PACEMAKER INSERTION Left 08/03/2018   Procedure: INSERTION PACEMAKER DUALCHAMBER;  Surgeon: Isaias Cowman, MD;  Location: [facility];  Service: Cardiovascular;  Laterality: Left;  . PROSTATE SURGERY  1990s   Dr. Eliberto Ivory ,in office procedure  . TONSILLECTOMY    . TOTAL KNEE ARTHROPLASTY Right 09/29/2019   Procedure: TOTAL KNEE ARTHROPLASTY;  Surgeon: Corky Mull, MD;  Location: [facility];  Service: Orthopedics;  Laterality: Right;  . TRANSURETHRAL RESECTION OF BLADDER TUMOR WITH MITOMYCIN-C N/A 08/25/2016   Procedure: TRANSURETHRAL RESECTION OF BLADDER TUMOR WITH MITOMYCIN-C;  Surgeon: Hollice Espy, MD;  Location: [facility];  Service: Urology;  Laterality: N/A;  . TRANSURETHRAL RESECTION OF BLADDER TUMOR WITH MITOMYCIN-C N/A 07/18/2019   Procedure: TRANSURETHRAL RESECTION OF BLADDER TUMOR WITH gemcitabine;  Surgeon: Hollice Espy, MD;  Location: [facility];  Service: Urology;  Laterality: N/A;  . TRANSURETHRAL RESECTION OF BLADDER TUMOR WITH MITOMYCIN-C N/A 03/26/2020   Procedure: TRANSURETHRAL RESECTION OF BLADDER TUMOR WITH gemcitabine;  Surgeon: Hollice Espy, MD;  Location: [facility];  Service: Urology;   Laterality: N/A;  . TRANSURETHRAL RESECTION OF PROSTATE N/A 06/14/2018   Procedure: TRANSURETHRAL RESECTION OF THE PROSTATE (TURP) with Gemcitabine;  Surgeon: Hollice Espy, MD;  Location: [facility];  Service: Urology;  Laterality: N/A;    SOCIAL HISTORY: Social History   Socioeconomic History  . Marital status: Widowed    Spouse name: Not on file  . Number of children: Not on file  . Years of education: Not on file  . Highest education level: Not on file  Occupational History  . Occupation: Programmer, applications  Tobacco Use  . Smoking status: Former Smoker    Packs/day: 0.50    Years: 30.00    Pack years: 15.00    Types: Cigarettes    Quit date: 08/11/1978    Years since quitting: 41.7  . Smokeless tobacco: Never Used  Vaping Use  . Vaping Use: Never used  Substance and Sexual Activity  . Alcohol use: Not Currently    Comment: 1 BEER DAILY  . Drug use: No  . Sexual activity: Not Currently  Other Topics Concern  . Not on file  Social History Narrative   Patient lives with his son, Kaiven.  He feels safe in his home.   Very active. Goes to the [employer] regularly, works on his computer and putters around his home.   Independant   Social Determinants of Health   Financial Resource Strain: Not on file  Food Insecurity: Not on file  Transportation Needs: Not on file  Physical Activity: Not on file  Stress: Not  on file  Social Connections: Not on file  Intimate Partner Violence: Not on file    FAMILY HISTORY: Family History  Problem Relation Age of Onset  . Cancer Mother   . Stroke Father   . Bladder Cancer Neg Hx   . Kidney cancer Neg Hx   . Prostate cancer Neg Hx     ALLERGIES:  is allergic to sulfa antibiotics.  MEDICATIONS:  Current Outpatient Medications  Medication Sig Dispense Refill  . allopurinol (ZYLOPRIM) 100 MG tablet Take 100 mg by mouth daily.     Marland Kitchen aspirin EC 81 MG tablet Take 81 mg by mouth daily.    . Biotin 10 MG CAPS Take 10 mg by  mouth daily.     . cetirizine (ZYRTEC) 10 MG tablet Take 10 mg by mouth every morning.     Mariane Baumgarten Calcium (STOOL SOFTENER PO) Take 1 tablet by mouth daily.     Marland Kitchen gabapentin (NEURONTIN) 600 MG tablet Take 600 mg by mouth See admin instructions. Take 600 mg by mouth in the morning and afternoon and 900 mg at bedtime    . omeprazole (PRILOSEC) 20 MG capsule Take 20 mg by mouth every morning.     . traMADol (ULTRAM) 50 MG tablet Take 50 mg by mouth 3 (three) times daily as needed for moderate pain.     . Xylitol (XYLIMELTS MT) Use as directed 2 tablets in the mouth or throat at bedtime.    . Sodium Chloride-Xylitol (XLEAR SINUS CARE SPRAY NA) Place 1 spray into the nose daily. (Patient not taking: Reported on 05/10/2020)     Current Facility-Administered Medications  Medication Dose Route Frequency Provider Last Rate Last Admin  . gemcitabine (GEMZAR) 2,000 mg in sodium chloride irrigation 0.9 % chemo infusion  2,000 mg Irrigation Once Hollice Espy, MD      . gemcitabine [facility]) chemo syringe for bladder instillation 2,000 mg  2,000 mg Bladder Instillation Once Hollice Espy, MD      . gemcitabine [facility]) chemo syringe for bladder instillation 2,000 mg  2,000 mg Bladder Instillation Once Hollice Espy, MD      . lidocaine (XYLOCAINE) 2 % (with pres) injection 1,000 mg  50 mL Other Once Hollice Espy, MD      . lidocaine (XYLOCAINE) 2 % jelly 1 application  1 application Urethral Once Hollice Espy, MD         PHYSICAL EXAMINATION: ECOG PERFORMANCE STATUS: 1 - Symptomatic but completely ambulatory Vitals:   05/10/20 0927  BP: 120/66  Pulse: 71  Resp: 18  Temp: 97.9 F (36.6 C)   Filed Weights   05/10/20 0927  Weight: 206 lb 14.4 oz (93.8 kg)    Physical Exam Constitutional:      General: He is not in acute distress.    Comments: Patient walks with a cane.  HENT:     Head: Normocephalic and atraumatic.  Eyes:     General: No scleral icterus. Cardiovascular:      Rate and Rhythm: Normal rate and regular rhythm.     Heart sounds: Normal heart sounds.  Pulmonary:     Effort: Pulmonary effort is normal. No respiratory distress.     Breath sounds: No wheezing.  Abdominal:     General: Bowel sounds are normal. There is no distension.     Palpations: Abdomen is soft.  Musculoskeletal:        General: No deformity. Normal range of motion.     Cervical back: Normal range of  motion and neck supple.  Skin:    General: Skin is warm and dry.     Findings: No erythema or rash.  Neurological:     Mental Status: He is alert and oriented to person, place, and time. Mental status is at baseline.     Cranial Nerves: No cranial nerve deficit.     Coordination: Coordination normal.  Psychiatric:        Mood and Affect: Mood normal.     LABORATORY DATA:  I have reviewed the data as listed Lab Results  Component Value Date   WBC 5.5 05/10/2020   HGB 13.3 05/10/2020   HCT 40.6 05/10/2020   MCV 86.8 05/10/2020   PLT 211 05/10/2020   Recent Labs    09/22/19 1038 09/30/19 0353 10/01/19 0416 03/16/20 1129 05/03/20 0847 05/10/20 0834  NA 141 141 139 144 139 140  K 4.5 4.5 3.9 4.4 4.0 4.2  CL 107 108 107 108 104 105  CO2 25 26 25 28 24 23   GLUCOSE 113* 107* 92 111* 103* 101*  BUN 29* 24* 26* 23 28* 28*  CREATININE 1.81* 1.66* 1.51* 1.51* 1.97* 1.85*  CALCIUM 9.1 8.4* 8.4* 8.6* 8.9 8.8*  GFRNONAA 33* 36* 41* 44* 32* 34*  GFRAA 38* 42* 47*  --   --   --   PROT 7.0  --   --   --  7.1 7.1  ALBUMIN 3.9  --   --   --  3.8 3.7  AST 19  --   --   --  22 18  ALT 13  --   --   --  12 10  ALKPHOS 106  --   --   --  100 88  BILITOT 0.8  --   --   --  0.8 0.8   Iron/TIBC/Ferritin/ %Sat No results found for: IRON, TIBC, FERRITIN, IRONPCTSAT    RADIOGRAPHIC STUDIES: I have personally reviewed the radiological images as listed and agreed with the findings in the report. No results found. 07/24/2016  CT abdomen pelvis wo contrast Numerous bilateral cystic  renal lesions, some with calcifications.These cannot be fully characterized without intravenous contrast.Punctate bilateral nephrolithiasis. Irregular bladder wall thickening with possible focal bladder wall mass posteriorly on the left. Cannot exclude bladder cancer. Recommend further evaluation with direct visualization given the patient's gross hematuria. 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 Diffuse aortic atherosclerosis. Cardiomegaly, coronary artery disease.   ASSESSMENT & PLAN:  1. Malignant neoplasm of overlapping sites of bladder (HCC)   2. Encounter for antineoplastic chemotherapy   3. Stage 3b chronic kidney disease (HCC)    Noninvasive bladder cancer, multiple recurrence Labs are reviewed and discussed with patient. Procedure risks cycle 2 weekly intravesical gemcitabine .  #UA showed large leukocyte, negative nitrate, microscopic hematuria Patient has no urinary tract symptoms. I try to send urine culture last week however culture wasn't done due to small volume of sample. We'll send culture today  #CKD, stage III Recommend patient to avoid nephrotoxins.  Creatinine level is slightly better than last week. Encourage oral hydration All questions were answered. The patient knows to call the clinic with any problems questions or concerns. Follow-up in 1 week 07/26/2016, MD, PhD Hematology Oncology [facility] Pager- [facility] 05/10/2020

## 2020-05-10 NOTE — Progress Notes (Signed)
Pt here for follow up. Pt reports having occasional feeling of "going to faint" that lasts about 5 minutes.

## 2020-05-10 NOTE — Progress Notes (Signed)
Ok to tx per MD with urine results. Per MD send another sample for culture. Collected from foley cather . Sent to lab. Drained 175 ml- clamped foley at 1112. Instilled Gemzar at 1113. Clamped. Pt able to hold for full hour without complaints. Unclamped and drained 350 ml clear yellow. Removed foley. Pt discharged stable. Return as scheduled.

## 2020-05-11 LAB — URINE CULTURE: Culture: NO GROWTH

## 2020-05-14 NOTE — Progress Notes (Signed)
Error. Patient not seen.

## 2020-05-17 ENCOUNTER — Inpatient Hospital Stay (HOSPITAL_BASED_OUTPATIENT_CLINIC_OR_DEPARTMENT_OTHER): Payer: Medicare Other | Admitting: Oncology

## 2020-05-17 ENCOUNTER — Inpatient Hospital Stay: Payer: Medicare Other

## 2020-05-17 ENCOUNTER — Inpatient Hospital Stay: Payer: Medicare Other | Attending: Oncology

## 2020-05-17 ENCOUNTER — Encounter: Payer: Self-pay | Admitting: Oncology

## 2020-05-17 VITALS — BP 149/76 | HR 78 | Temp 96.7°F | Resp 16 | Wt 205.3 lb

## 2020-05-17 DIAGNOSIS — I959 Hypotension, unspecified: Secondary | ICD-10-CM | POA: Diagnosis not present

## 2020-05-17 DIAGNOSIS — C678 Malignant neoplasm of overlapping sites of bladder: Secondary | ICD-10-CM | POA: Diagnosis present

## 2020-05-17 DIAGNOSIS — I10 Essential (primary) hypertension: Secondary | ICD-10-CM | POA: Diagnosis not present

## 2020-05-17 DIAGNOSIS — N1832 Chronic kidney disease, stage 3b: Secondary | ICD-10-CM | POA: Insufficient documentation

## 2020-05-17 DIAGNOSIS — Z8249 Family history of ischemic heart disease and other diseases of the circulatory system: Secondary | ICD-10-CM | POA: Insufficient documentation

## 2020-05-17 DIAGNOSIS — R3129 Other microscopic hematuria: Secondary | ICD-10-CM | POA: Diagnosis not present

## 2020-05-17 DIAGNOSIS — Z79899 Other long term (current) drug therapy: Secondary | ICD-10-CM | POA: Diagnosis not present

## 2020-05-17 DIAGNOSIS — Z7982 Long term (current) use of aspirin: Secondary | ICD-10-CM | POA: Diagnosis not present

## 2020-05-17 DIAGNOSIS — Z87891 Personal history of nicotine dependence: Secondary | ICD-10-CM | POA: Diagnosis not present

## 2020-05-17 DIAGNOSIS — Z5111 Encounter for antineoplastic chemotherapy: Secondary | ICD-10-CM | POA: Diagnosis not present

## 2020-05-17 DIAGNOSIS — Z809 Family history of malignant neoplasm, unspecified: Secondary | ICD-10-CM | POA: Diagnosis not present

## 2020-05-17 LAB — URINALYSIS, COMPLETE (UACMP) WITH MICROSCOPIC
Bilirubin Urine: NEGATIVE
Glucose, UA: NEGATIVE mg/dL
Hgb urine dipstick: NEGATIVE
Ketones, ur: NEGATIVE mg/dL
Nitrite: NEGATIVE
Protein, ur: 30 mg/dL — AB
Specific Gravity, Urine: 1.016 (ref 1.005–1.030)
WBC, UA: >50 WBC/hpf — ABNORMAL HIGH (ref 0–5)
pH: 5 (ref 5.0–8.0)

## 2020-05-17 LAB — COMPREHENSIVE METABOLIC PANEL
ALT: 12 U/L (ref 0–44)
AST: 20 U/L (ref 15–41)
Albumin: 3.9 g/dL (ref 3.5–5.0)
Alkaline Phosphatase: 95 U/L (ref 38–126)
Anion gap: 10 (ref 5–15)
BUN: 25 mg/dL — ABNORMAL HIGH (ref 8–23)
CO2: 25 mmol/L (ref 22–32)
Calcium: 9 mg/dL (ref 8.9–10.3)
Chloride: 105 mmol/L (ref 98–111)
Creatinine, Ser: 1.85 mg/dL — ABNORMAL HIGH (ref 0.61–1.24)
GFR, Estimated: 34 mL/min — ABNORMAL LOW (ref >60)
Glucose, Bld: 102 mg/dL — ABNORMAL HIGH (ref 70–99)
Potassium: 4.3 mmol/L (ref 3.5–5.1)
Sodium: 140 mmol/L (ref 135–145)
Total Bilirubin: 0.5 mg/dL (ref 0.3–1.2)
Total Protein: 7.1 g/dL (ref 6.5–8.1)

## 2020-05-17 LAB — CBC WITH DIFFERENTIAL/PLATELET
Abs Immature Granulocytes: 0.03 10*3/uL (ref 0.00–0.07)
Basophils Absolute: 0 10*3/uL (ref 0.0–0.1)
Basophils Relative: 1 %
Eosinophils Absolute: 0.1 10*3/uL (ref 0.0–0.5)
Eosinophils Relative: 2 %
HCT: 40.6 % (ref 39.0–52.0)
Hemoglobin: 13.5 g/dL (ref 13.0–17.0)
Immature Granulocytes: 1 %
Lymphocytes Relative: 18 %
Lymphs Abs: 1.1 10*3/uL (ref 0.7–4.0)
MCH: 28.7 pg (ref 26.0–34.0)
MCHC: 33.3 g/dL (ref 30.0–36.0)
MCV: 86.4 fL (ref 80.0–100.0)
Monocytes Absolute: 0.5 10*3/uL (ref 0.1–1.0)
Monocytes Relative: 7 %
Neutro Abs: 4.4 10*3/uL (ref 1.7–7.7)
Neutrophils Relative %: 71 %
Platelets: 196 10*3/uL (ref 150–400)
RBC: 4.7 MIL/uL (ref 4.22–5.81)
RDW: 14.7 % (ref 11.5–15.5)
WBC: 6.1 10*3/uL (ref 4.0–10.5)
nRBC: 0 % (ref 0.0–0.2)

## 2020-05-17 MED ORDER — GEMCITABINE CHEMO FOR BLADDER INSTILLATION 2000 MG
2000.0000 mg | Freq: Once | INTRAVENOUS | Status: AC
  Administered 2020-05-17: 2000 mg via INTRAVESICAL
  Filled 2020-05-17: qty 52.6

## 2020-05-17 MED ORDER — SODIUM CHLORIDE 0.9 % IV SOLN
Freq: Once | INTRAVENOUS | Status: DC
  Filled 2020-05-17: qty 250

## 2020-05-17 MED ORDER — PROCHLORPERAZINE MALEATE 10 MG PO TABS
10.0000 mg | ORAL_TABLET | Freq: Once | ORAL | Status: AC
  Administered 2020-05-17: 10 mg via ORAL
  Filled 2020-05-17: qty 1

## 2020-05-17 NOTE — Progress Notes (Signed)
Hematology/Oncology progress note [facility] [phone] [phone]   Patient Care Team: Kirk Ruths, MD as PCP - General (Internal Medicine)  REFERRING PROVIDER: Kirk Ruths, MD  CHIEF COMPLAINTS/REASON FOR VISIT:  noninvasive bladder cancer.  HISTORY OF PRESENTING ILLNESS:   Michael [facility]ay is a  87 y.o.  male with PMH listed below was seen in consultation at the request of  Kirk Ruths, MD  for evaluation of noninvasive bladder cancer  Patient has a history of recurrent urothelial carcinoma and follows up with Dr. Erlene Quan.  Previously received BCG He has underwent multiple procedures including TURBT for low-grade nonmuscle invasive bladder cancer. His most recent recurrence was found on 03/26/2020, per urology Dr. Cherrie Gauze note, papillary carpeting involving the right lateral bladder wall extending towards bladder neck, at least 2.5 x 2 cm. In diameter. There is also carpeting at the dome. Less than 1 cm. Bilateral retrograde unremarkable.  Chemotherapy was instilled postoperatively, gemcitabine.  03/26/2020 Pathology showed non invasive papillary urothelia carcinoma, low grade. Muscularis propria present and uninvolved.  Given that patient has had high frequency and recurrence despite induction BCG, patient was referred to hematology oncology to establish care for evaluation of intravesical gemcitabine weekly x6 to help to reduce the frequency and severity of the recurrence.   experienced mild hematuria after recent cystoscopy and procedure.  Symptoms are gettingbetter. Appetite is fair.  Lives with his son Mild chronic fatigue ,  INTERVAL HISTORY Michael Holloway is a 87 y.o. male who has above history reviewed by me today presents for follow up visit for evaluation prior to intravesical gemcitabine treatment. Problems and complaints are listed below: Patient continues to experience intermittent episodes of  feeling " fainting, with nausea sometimes". Symptoms usually triggered by exercise, exertion, or sometimes no trigger. He has experienced similar symptoms even before he started  bladder cancer treatments.  He has a pacemaker.  He has called cardiologist office and has an appointment with them on 06/01/2020.   Denies any nausea vomiting diarrhea, fever or chills, dysuria, abdominal pain.    Review of Systems  Constitutional: Positive for fatigue. Negative for appetite change, chills, fever and unexpected weight change.  HENT:   Negative for hearing loss and voice change.   Eyes: Negative for eye problems and icterus.  Respiratory: Negative for chest tightness, cough and shortness of breath.   Cardiovascular: Negative for chest pain, leg swelling and palpitations.  Gastrointestinal: Negative for abdominal distention and abdominal pain.  Endocrine: Negative for hot flashes.  Genitourinary: Negative for difficulty urinating, dysuria and frequency.   Musculoskeletal: Negative for arthralgias.  Skin: Negative for itching and rash.  Neurological: Negative for light-headedness and numbness.  Hematological: Negative for adenopathy. Does not bruise/bleed easily.  Psychiatric/Behavioral: Negative for confusion.    MEDICAL HISTORY:  Past Medical History:  Diagnosis Date  . Allergy   . Arthritis   . Bladder cancer ([place])   . Cancer ([place])    Basal Cell Skin Cancer  . Cataract   . Chronic kidney disease    Stage 3 CKD  . Dysrhythmia   . GERD (gastroesophageal reflux disease)   . Gout   . History of kidney stones   . Hypertension   . Neuropathy   . Presence of permanent cardiac pacemaker 2020  . Spinal stenosis     SURGICAL HISTORY: Past Surgical History:  Procedure Laterality Date  . BACK SURGERY    . CERVICAL SPINE SURGERY  X 2  . CHOLECYSTECTOMY    . CYSTOSCOPY W/ RETROGRADES Bilateral 08/25/2016   Procedure: CYSTOSCOPY WITH RETROGRADE PYELOGRAM;  Surgeon: Hollice Espy,  MD;  Location: [facility];  Service: Urology;  Laterality: Bilateral;  . CYSTOSCOPY W/ RETROGRADES Bilateral 07/18/2019   Procedure: CYSTOSCOPY WITH RETROGRADE PYELOGRAM;  Surgeon: Hollice Espy, MD;  Location: [facility];  Service: Urology;  Laterality: Bilateral;  . CYSTOSCOPY W/ RETROGRADES Bilateral 03/26/2020   Procedure: CYSTOSCOPY WITH RETROGRADE PYELOGRAM;  Surgeon: Hollice Espy, MD;  Location: [facility];  Service: Urology;  Laterality: Bilateral;  . EYE SURGERY Bilateral    Cataract Extraction with IOL  . JOINT REPLACEMENT Bilateral    total knee replacements  . LOOP RECORDER INSERTION N/A 06/03/2017   Procedure: LOOP RECORDER INSERTION;  Surgeon: Isaias Cowman, MD;  Location: [facility];  Service: Cardiovascular;  Laterality: N/A;  . LUMBAR LAMINECTOMY      X 4  . PACEMAKER INSERTION Left 08/03/2018   Procedure: INSERTION PACEMAKER DUALCHAMBER;  Surgeon: Isaias Cowman, MD;  Location: [facility];  Service: Cardiovascular;  Laterality: Left;  . PROSTATE SURGERY  1990s   Dr. Eliberto Ivory ,in office procedure  . TONSILLECTOMY    . TOTAL KNEE ARTHROPLASTY Right 09/29/2019   Procedure: TOTAL KNEE ARTHROPLASTY;  Surgeon: Corky Mull, MD;  Location: [facility];  Service: Orthopedics;  Laterality: Right;  . TRANSURETHRAL RESECTION OF BLADDER TUMOR WITH MITOMYCIN-C N/A 08/25/2016   Procedure: TRANSURETHRAL RESECTION OF BLADDER TUMOR WITH MITOMYCIN-C;  Surgeon: Hollice Espy, MD;  Location: [facility];  Service: Urology;  Laterality: N/A;  . TRANSURETHRAL RESECTION OF BLADDER TUMOR WITH MITOMYCIN-C N/A 07/18/2019   Procedure: TRANSURETHRAL RESECTION OF BLADDER TUMOR WITH gemcitabine;  Surgeon: Hollice Espy, MD;  Location: [facility];  Service: Urology;  Laterality: N/A;  . TRANSURETHRAL RESECTION OF BLADDER TUMOR WITH MITOMYCIN-C N/A 03/26/2020   Procedure: TRANSURETHRAL RESECTION OF BLADDER TUMOR WITH gemcitabine;  Surgeon: Hollice Espy, MD;  Location: [facility];  Service: Urology;   Laterality: N/A;  . TRANSURETHRAL RESECTION OF PROSTATE N/A 06/14/2018   Procedure: TRANSURETHRAL RESECTION OF THE PROSTATE (TURP) with Gemcitabine;  Surgeon: Hollice Espy, MD;  Location: [facility];  Service: Urology;  Laterality: N/A;    SOCIAL HISTORY: Social History   Socioeconomic History  . Marital status: Widowed    Spouse name: Not on file  . Number of children: Not on file  . Years of education: Not on file  . Highest education level: Not on file  Occupational History  . Occupation: Programmer, applications  Tobacco Use  . Smoking status: Former Smoker    Packs/day: 0.50    Years: 30.00    Pack years: 15.00    Types: Cigarettes    Quit date: 08/11/1978    Years since quitting: 41.7  . Smokeless tobacco: Never Used  Vaping Use  . Vaping Use: Never used  Substance and Sexual Activity  . Alcohol use: Not Currently    Comment: 1 BEER DAILY  . Drug use: No  . Sexual activity: Not Currently  Other Topics Concern  . Not on file  Social History Narrative   Patient lives with his son, Michael Holloway.  He feels safe in his home.   Very active. Goes to the [employer] regularly, works on his computer and putters around his home.   Independant   Social Determinants of Health   Financial Resource Strain: Not on file  Food Insecurity: Not on file  Transportation Needs: Not on file  Physical Activity: Not on  file  Stress: Not on file  Social Connections: Not on file  Intimate Partner Violence: Not on file    FAMILY HISTORY: Family History  Problem Relation Age of Onset  . Cancer Mother   . Stroke Father   . Bladder Cancer Neg Hx   . Kidney cancer Neg Hx   . Prostate cancer Neg Hx     ALLERGIES:  is allergic to sulfa antibiotics.  MEDICATIONS:  Current Outpatient Medications  Medication Sig Dispense Refill  . allopurinol (ZYLOPRIM) 100 MG tablet Take 100 mg by mouth daily.     Marland Kitchen aspirin EC 81 MG tablet Take 81 mg by mouth daily.    . Biotin 10 MG CAPS Take 10 mg by  mouth daily.     . cetirizine (ZYRTEC) 10 MG tablet Take 10 mg by mouth every morning.     Mariane Baumgarten Calcium (STOOL SOFTENER PO) Take 1 tablet by mouth daily.     Marland Kitchen gabapentin (NEURONTIN) 600 MG tablet Take 600 mg by mouth See admin instructions. Take 600 mg by mouth in the morning and afternoon and 900 mg at bedtime    . omeprazole (PRILOSEC) 20 MG capsule Take 20 mg by mouth every morning.     . traMADol (ULTRAM) 50 MG tablet Take 50 mg by mouth 3 (three) times daily as needed for moderate pain.     . Xylitol (XYLIMELTS MT) Use as directed 2 tablets in the mouth or throat at bedtime.    . Sodium Chloride-Xylitol (XLEAR SINUS CARE SPRAY NA) Place 1 spray into the nose daily. (Patient not taking: No sig reported)     Current Facility-Administered Medications  Medication Dose Route Frequency Provider Last Rate Last Admin  . gemcitabine (GEMZAR) 2,000 mg in sodium chloride irrigation 0.9 % chemo infusion  2,000 mg Irrigation Once Hollice Espy, MD      . gemcitabine [facility]) chemo syringe for bladder instillation 2,000 mg  2,000 mg Bladder Instillation Once Hollice Espy, MD      . gemcitabine [facility]) chemo syringe for bladder instillation 2,000 mg  2,000 mg Bladder Instillation Once Hollice Espy, MD      . lidocaine (XYLOCAINE) 2 % (with pres) injection 1,000 mg  50 mL Other Once Hollice Espy, MD      . lidocaine (XYLOCAINE) 2 % jelly 1 application  1 application Urethral Once Hollice Espy, MD       Facility-Administered Medications Ordered in Other Visits  Medication Dose Route Frequency Provider Last Rate Last Admin  . 0.9 %  sodium chloride infusion   Intravenous Once Earlie Server, MD         PHYSICAL EXAMINATION: ECOG PERFORMANCE STATUS: 1 - Symptomatic but completely ambulatory Vitals:   05/17/20 0923  BP: (!) 149/76  Pulse: 78  Resp: 16  Temp: (!) 96.7 F (35.9 C)   Filed Weights   05/17/20 0923  Weight: 205 lb 4.8 oz (93.1 kg)    Physical Exam Constitutional:       General: He is not in acute distress.    Comments: Patient walks with a cane.  HENT:     Head: Normocephalic and atraumatic.  Eyes:     General: No scleral icterus. Cardiovascular:     Rate and Rhythm: Normal rate and regular rhythm.     Heart sounds: Normal heart sounds.  Pulmonary:     Effort: Pulmonary effort is normal. No respiratory distress.     Breath sounds: No wheezing.  Abdominal:  General: Bowel sounds are normal. There is no distension.     Palpations: Abdomen is soft.  Musculoskeletal:        General: No deformity. Normal range of motion.     Cervical back: Normal range of motion and neck supple.  Skin:    General: Skin is warm and dry.     Findings: No erythema or rash.  Neurological:     Mental Status: He is alert and oriented to person, place, and time. Mental status is at baseline.     Cranial Nerves: No cranial nerve deficit.     Coordination: Coordination normal.  Psychiatric:        Mood and Affect: Mood normal.     LABORATORY DATA:  I have reviewed the data as listed Lab Results  Component Value Date   WBC 6.1 05/17/2020   HGB 13.5 05/17/2020   HCT 40.6 05/17/2020   MCV 86.4 05/17/2020   PLT 196 05/17/2020   Recent Labs    09/22/19 1038 09/30/19 0353 10/01/19 0416 03/16/20 1129 05/03/20 0847 05/10/20 0834 05/17/20 0910  NA 141 141 139   < > 139 140 140  K 4.5 4.5 3.9   < > 4.0 4.2 4.3  CL 107 108 107   < > 104 105 105  CO2 25 26 25    < > 24 23 25   GLUCOSE 113* 107* 92   < > 103* 101* 102*  BUN 29* 24* 26*   < > 28* 28* 25*  CREATININE 1.81* 1.66* 1.51*   < > 1.97* 1.85* 1.85*  CALCIUM 9.1 8.4* 8.4*   < > 8.9 8.8* 9.0  GFRNONAA 33* 36* 41*   < > 32* 34* 34*  GFRAA 38* 42* 47*  --   --   --   --   PROT 7.0  --   --   --  7.1 7.1 7.1  ALBUMIN 3.9  --   --   --  3.8 3.7 3.9  AST 19  --   --   --  22 18 20   ALT 13  --   --   --  12 10 12   ALKPHOS 106  --   --   --  100 88 95  BILITOT 0.8  --   --   --  0.8 0.8 0.5   < > = values  in this interval not displayed.   Iron/TIBC/Ferritin/ %Sat No results found for: IRON, TIBC, FERRITIN, IRONPCTSAT    RADIOGRAPHIC STUDIES: I have personally reviewed the radiological images as listed and agreed with the findings in the report. No results found. 07/24/2016  CT abdomen pelvis wo contrast Numerous bilateral cystic renal lesions, some with calcifications.These cannot be fully characterized without intravenous contrast.Punctate bilateral nephrolithiasis. Irregular bladder wall thickening with possible focal bladder wall mass posteriorly on the left. Cannot exclude bladder cancer. Recommend further evaluation with direct visualization given the patient's gross hematuria. 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 Diffuse aortic atherosclerosis. Cardiomegaly, coronary artery disease.   ASSESSMENT & PLAN:  1. Malignant neoplasm of overlapping sites of bladder (HCC)   2. Encounter for antineoplastic chemotherapy   3. Stage 3b chronic kidney disease (HCC)    Noninvasive bladder cancer, multiple recurrence Labs are reviewed and discussed with patient.  Counts are acceptable to proceed with cycle 3 weekly intravesical gemcitabine.  Overall he tolerates well.  #UA showed large leukocyte, negative nitrate, microscopic hematuria, similar to last  week and culture was negative. Patient has no urinary tract symptoms.  #CKD, stage III Recommend patient to avoid nephrotoxins.  Creatinine is the same as last week.  Encourage oral hydration.  Offered patient IV hydration session and patient prefers to stay with oral hydration.  All questions were answered. The patient knows to call the clinic with any problems questions or concerns. Follow-up in 1 week Earlie Server, MD, PhD Hematology Oncology [facility] Pager- [phone] 05/17/2020

## 2020-05-17 NOTE — Progress Notes (Unsigned)
Patient stable at discharge.

## 2020-05-17 NOTE — Progress Notes (Signed)
Patient reports an increase in episodes of feeling weak, faint, with nausea.  The last episode was this morning and lasted about 10 minutes.

## 2020-05-24 ENCOUNTER — Inpatient Hospital Stay: Payer: Medicare Other

## 2020-05-24 ENCOUNTER — Encounter: Payer: Self-pay | Admitting: Oncology

## 2020-05-24 ENCOUNTER — Inpatient Hospital Stay (HOSPITAL_BASED_OUTPATIENT_CLINIC_OR_DEPARTMENT_OTHER): Payer: Medicare Other | Admitting: Oncology

## 2020-05-24 ENCOUNTER — Other Ambulatory Visit: Payer: Self-pay

## 2020-05-24 VITALS — BP 144/93 | HR 64 | Resp 20

## 2020-05-24 VITALS — BP 82/53 | HR 84 | Temp 98.6°F | Resp 18 | Wt 202.8 lb

## 2020-05-24 DIAGNOSIS — N1832 Chronic kidney disease, stage 3b: Secondary | ICD-10-CM

## 2020-05-24 DIAGNOSIS — I959 Hypotension, unspecified: Secondary | ICD-10-CM

## 2020-05-24 DIAGNOSIS — C678 Malignant neoplasm of overlapping sites of bladder: Secondary | ICD-10-CM

## 2020-05-24 DIAGNOSIS — Z5111 Encounter for antineoplastic chemotherapy: Secondary | ICD-10-CM

## 2020-05-24 HISTORY — DX: Hypotension, unspecified: I95.9

## 2020-05-24 LAB — CBC WITH DIFFERENTIAL/PLATELET
Abs Immature Granulocytes: 0.01 10*3/uL (ref 0.00–0.07)
Basophils Absolute: 0 10*3/uL (ref 0.0–0.1)
Basophils Relative: 0 %
Eosinophils Absolute: 0.1 10*3/uL (ref 0.0–0.5)
Eosinophils Relative: 2 %
HCT: 40.8 % (ref 39.0–52.0)
Hemoglobin: 13.7 g/dL (ref 13.0–17.0)
Immature Granulocytes: 0 %
Lymphocytes Relative: 21 %
Lymphs Abs: 0.9 10*3/uL (ref 0.7–4.0)
MCH: 29.3 pg (ref 26.0–34.0)
MCHC: 33.6 g/dL (ref 30.0–36.0)
MCV: 87.2 fL (ref 80.0–100.0)
Monocytes Absolute: 0.3 10*3/uL (ref 0.1–1.0)
Monocytes Relative: 6 %
Neutro Abs: 3.2 10*3/uL (ref 1.7–7.7)
Neutrophils Relative %: 71 %
Platelets: 231 10*3/uL (ref 150–400)
RBC: 4.68 MIL/uL (ref 4.22–5.81)
RDW: 15.4 % (ref 11.5–15.5)
WBC: 4.5 10*3/uL (ref 4.0–10.5)
nRBC: 0 % (ref 0.0–0.2)

## 2020-05-24 LAB — COMPREHENSIVE METABOLIC PANEL
ALT: 11 U/L (ref 0–44)
AST: 18 U/L (ref 15–41)
Albumin: 3.6 g/dL (ref 3.5–5.0)
Alkaline Phosphatase: 103 U/L (ref 38–126)
Anion gap: 8 (ref 5–15)
BUN: 25 mg/dL — ABNORMAL HIGH (ref 8–23)
CO2: 26 mmol/L (ref 22–32)
Calcium: 8.8 mg/dL — ABNORMAL LOW (ref 8.9–10.3)
Chloride: 105 mmol/L (ref 98–111)
Creatinine, Ser: 1.62 mg/dL — ABNORMAL HIGH (ref 0.61–1.24)
GFR, Estimated: 40 mL/min — ABNORMAL LOW (ref >60)
Glucose, Bld: 142 mg/dL — ABNORMAL HIGH (ref 70–99)
Potassium: 4.4 mmol/L (ref 3.5–5.1)
Sodium: 139 mmol/L (ref 135–145)
Total Bilirubin: 0.5 mg/dL (ref 0.3–1.2)
Total Protein: 6.8 g/dL (ref 6.5–8.1)

## 2020-05-24 LAB — URINALYSIS, COMPLETE (UACMP) WITH MICROSCOPIC
Bilirubin Urine: NEGATIVE
Glucose, UA: NEGATIVE mg/dL
Hgb urine dipstick: NEGATIVE
Ketones, ur: NEGATIVE mg/dL
Nitrite: NEGATIVE
Protein, ur: 100 mg/dL — AB
Specific Gravity, Urine: 1.014 (ref 1.005–1.030)
WBC, UA: >50 WBC/hpf — ABNORMAL HIGH (ref 0–5)
pH: 5 (ref 5.0–8.0)

## 2020-05-24 MED ORDER — SODIUM CHLORIDE 0.9 % IV SOLN
Freq: Once | INTRAVENOUS | Status: AC
  Administered 2020-05-24: 1000 mL via INTRAVENOUS
  Filled 2020-05-24: qty 250

## 2020-05-24 NOTE — Progress Notes (Signed)
Pt here for follow up and treatment. No new concerns voiced.   

## 2020-05-24 NOTE — Progress Notes (Signed)
1122- Patient tolerated 0.9% Sodium Chloride infusion well. Blood Pressure: 144/93 post 0.9% Sodium Chloride infusion. MD, Dr. Tasia Catchings, notified and aware. Per MD order: patient may be discharged to home at this time.  1127- Patient stable and discharged to home at this time.

## 2020-05-24 NOTE — Progress Notes (Signed)
Hematology/Oncology progress note [facility] [phone] [phone]   Patient Care Team: Kirk Ruths, MD as PCP - General (Internal Medicine)  REFERRING PROVIDER: Kirk Ruths, MD  CHIEF COMPLAINTS/REASON FOR VISIT:  noninvasive bladder cancer.  HISTORY OF PRESENTING ILLNESS:   Michael Holloway is a  87 y.o.  male with PMH listed below was seen in consultation at the request of  Kirk Ruths, MD  for evaluation of noninvasive bladder cancer  Patient has a history of recurrent urothelial carcinoma and follows up with Dr. Erlene Quan.  Previously received BCG He has underwent multiple procedures including TURBT for low-grade nonmuscle invasive bladder cancer. His most recent recurrence was found on 03/26/2020, per urology Dr. Cherrie Gauze note, papillary carpeting involving the right lateral bladder wall extending towards bladder neck, at least 2.5 x 2 cm. In diameter. There is also carpeting at the dome. Less than 1 cm. Bilateral retrograde unremarkable.  Chemotherapy was instilled postoperatively, gemcitabine.  03/26/2020 Pathology showed non invasive papillary urothelia carcinoma, low grade. Muscularis propria present and uninvolved.  Given that patient has had high frequency and recurrence despite induction BCG, patient was referred to hematology oncology to establish care for evaluation of intravesical gemcitabine weekly x6 to help to reduce the frequency and severity of the recurrence.   experienced mild hematuria after recent cystoscopy and procedure.  Symptoms are gettingbetter. Appetite is fair.  Lives with his son Mild chronic fatigue ,  INTERVAL HISTORY Michael Holloway is a 87 y.o. male who has above history reviewed by me today presents for follow up visit for evaluation prior to intravesical gemcitabine treatment. Problems and complaints are listed below: Patient reports feeling lightheaded today. He is not on  any blood pressure medication. Denies any fever, chills, shortness of breath, chest pain, dysuria, leg swelling. Blood pressure was 82/53 in the clinic. He has had holter monitoring done recently and will see his cardiologist to discuss results.      Review of Systems  Constitutional: Positive for fatigue. Negative for appetite change, chills, fever and unexpected weight change.  HENT:   Negative for hearing loss and voice change.   Eyes: Negative for eye problems and icterus.  Respiratory: Negative for chest tightness, cough and shortness of breath.   Cardiovascular: Negative for chest pain, leg swelling and palpitations.  Gastrointestinal: Negative for abdominal distention and abdominal pain.  Endocrine: Negative for hot flashes.  Genitourinary: Negative for difficulty urinating, dysuria and frequency.   Musculoskeletal: Negative for arthralgias.  Skin: Negative for itching and rash.  Neurological: Positive for light-headedness. Negative for numbness.  Hematological: Negative for adenopathy. Does not bruise/bleed easily.  Psychiatric/Behavioral: Negative for confusion.    MEDICAL HISTORY:  Past Medical History:  Diagnosis Date  . Allergy   . Arthritis   . Bladder cancer ([place])   . Cancer ([place])    Basal Cell Skin Cancer  . Cataract   . Chronic kidney disease    Stage 3 CKD  . Dysrhythmia   . GERD (gastroesophageal reflux disease)   . Gout   . History of kidney stones   . Hypertension   . Hypotension 05/24/2020  . Neuropathy   . Presence of permanent cardiac pacemaker 2020  . Spinal stenosis     SURGICAL HISTORY: Past Surgical History:  Procedure Laterality Date  . BACK SURGERY    . CERVICAL SPINE SURGERY      X 2  . CHOLECYSTECTOMY    . CYSTOSCOPY W/ RETROGRADES Bilateral 08/25/2016  Procedure: CYSTOSCOPY WITH RETROGRADE PYELOGRAM;  Surgeon: Hollice Espy, MD;  Location: [facility];  Service: Urology;  Laterality: Bilateral;  . CYSTOSCOPY W/ RETROGRADES Bilateral  07/18/2019   Procedure: CYSTOSCOPY WITH RETROGRADE PYELOGRAM;  Surgeon: Hollice Espy, MD;  Location: [facility];  Service: Urology;  Laterality: Bilateral;  . CYSTOSCOPY W/ RETROGRADES Bilateral 03/26/2020   Procedure: CYSTOSCOPY WITH RETROGRADE PYELOGRAM;  Surgeon: Hollice Espy, MD;  Location: [facility];  Service: Urology;  Laterality: Bilateral;  . EYE SURGERY Bilateral    Cataract Extraction with IOL  . JOINT REPLACEMENT Bilateral    total knee replacements  . LOOP RECORDER INSERTION N/A 06/03/2017   Procedure: LOOP RECORDER INSERTION;  Surgeon: Isaias Cowman, MD;  Location: [place] [facility];  Service: Cardiovascular;  Laterality: N/A;  . LUMBAR LAMINECTOMY      X 4  . PACEMAKER INSERTION Left 08/03/2018   Procedure: INSERTION PACEMAKER DUALCHAMBER;  Surgeon: Isaias Cowman, MD;  Location: [facility];  Service: Cardiovascular;  Laterality: Left;  . PROSTATE SURGERY  1990s   Dr. Eliberto Ivory ,in office procedure  . TONSILLECTOMY    . TOTAL KNEE ARTHROPLASTY Right 09/29/2019   Procedure: TOTAL KNEE ARTHROPLASTY;  Surgeon: Corky Mull, MD;  Location: [facility];  Service: Orthopedics;  Laterality: Right;  . TRANSURETHRAL RESECTION OF BLADDER TUMOR WITH MITOMYCIN-C N/A 08/25/2016   Procedure: TRANSURETHRAL RESECTION OF BLADDER TUMOR WITH MITOMYCIN-C;  Surgeon: Hollice Espy, MD;  Location: [facility];  Service: Urology;  Laterality: N/A;  . TRANSURETHRAL RESECTION OF BLADDER TUMOR WITH MITOMYCIN-C N/A 07/18/2019   Procedure: TRANSURETHRAL RESECTION OF BLADDER TUMOR WITH gemcitabine;  Surgeon: Hollice Espy, MD;  Location: [facility];  Service: Urology;  Laterality: N/A;  . TRANSURETHRAL RESECTION OF BLADDER TUMOR WITH MITOMYCIN-C N/A 03/26/2020   Procedure: TRANSURETHRAL RESECTION OF BLADDER TUMOR WITH gemcitabine;  Surgeon: Hollice Espy, MD;  Location: [facility];  Service: Urology;  Laterality: N/A;  . TRANSURETHRAL RESECTION OF PROSTATE N/A 06/14/2018   Procedure: TRANSURETHRAL RESECTION  OF THE PROSTATE (TURP) with Gemcitabine;  Surgeon: Hollice Espy, MD;  Location: [facility];  Service: Urology;  Laterality: N/A;    SOCIAL HISTORY: Social History   Socioeconomic History  . Marital status: Widowed    Spouse name: Not on file  . Number of children: Not on file  . Years of education: Not on file  . Highest education level: Not on file  Occupational History  . Occupation: Programmer, applications  Tobacco Use  . Smoking status: Former Smoker    Packs/day: 0.50    Years: 30.00    Pack years: 15.00    Types: Cigarettes    Quit date: 08/11/1978    Years since quitting: 41.8  . Smokeless tobacco: Never Used  Vaping Use  . Vaping Use: Never used  Substance and Sexual Activity  . Alcohol use: Not Currently    Comment: 1 BEER DAILY  . Drug use: No  . Sexual activity: Not Currently  Other Topics Concern  . Not on file  Social History Narrative   Patient lives with his son, Thedford.  He feels safe in his home.   Very active. Goes to the [employer] regularly, works on his computer and putters around his home.   Independant   Social Determinants of Health   Financial Resource Strain: Not on file  Food Insecurity: Not on file  Transportation Needs: Not on file  Physical Activity: Not on file  Stress: Not on file  Social Connections: Not on file  Intimate Partner Violence:  Not on file    FAMILY HISTORY: Family History  Problem Relation Age of Onset  . Cancer Mother   . Stroke Father   . Bladder Cancer Neg Hx   . Kidney cancer Neg Hx   . Prostate cancer Neg Hx     ALLERGIES:  is allergic to sulfa antibiotics.  MEDICATIONS:  Current Outpatient Medications  Medication Sig Dispense Refill  . allopurinol (ZYLOPRIM) 100 MG tablet Take 100 mg by mouth daily.     Marland Kitchen aspirin EC 81 MG tablet Take 81 mg by mouth daily.    . Biotin 10 MG CAPS Take 10 mg by mouth daily.     . cetirizine (ZYRTEC) 10 MG tablet Take 10 mg by mouth every morning.     Mariane Baumgarten  Calcium (STOOL SOFTENER PO) Take 1 tablet by mouth daily.     Marland Kitchen gabapentin (NEURONTIN) 600 MG tablet Take 600 mg by mouth See admin instructions. Take 600 mg by mouth in the morning and afternoon and 900 mg at bedtime    . omeprazole (PRILOSEC) 20 MG capsule Take 20 mg by mouth every morning.     . traMADol (ULTRAM) 50 MG tablet Take 50 mg by mouth 3 (three) times daily as needed for moderate pain.     . Xylitol (XYLIMELTS MT) Use as directed 2 tablets in the mouth or throat at bedtime.    . Sodium Chloride-Xylitol (XLEAR SINUS CARE SPRAY NA) Place 1 spray into the nose daily. (Patient not taking: No sig reported)     Current Facility-Administered Medications  Medication Dose Route Frequency Provider Last Rate Last Admin  . gemcitabine (GEMZAR) 2,000 mg in sodium chloride irrigation 0.9 % chemo infusion  2,000 mg Irrigation Once Hollice Espy, MD      . gemcitabine [facility]) chemo syringe for bladder instillation 2,000 mg  2,000 mg Bladder Instillation Once Hollice Espy, MD      . gemcitabine [facility]) chemo syringe for bladder instillation 2,000 mg  2,000 mg Bladder Instillation Once Hollice Espy, MD      . lidocaine (XYLOCAINE) 2 % (with pres) injection 1,000 mg  50 mL Other Once Hollice Espy, MD      . lidocaine (XYLOCAINE) 2 % jelly 1 application  1 application Urethral Once Hollice Espy, MD       Facility-Administered Medications Ordered in Other Visits  Medication Dose Route Frequency Provider Last Rate Last Admin  . 0.9 %  sodium chloride infusion   Intravenous Once Earlie Server, MD         PHYSICAL EXAMINATION: ECOG PERFORMANCE STATUS: 1 - Symptomatic but completely ambulatory Vitals:   05/24/20 0947 05/24/20 0948  BP: (!) 88/58 (!) 82/53  Pulse:  84  Resp:    Temp:     Filed Weights   05/24/20 0913  Weight: 202 lb 12.8 oz (92 kg)    Physical Exam Constitutional:      General: He is not in acute distress.    Comments: Patient walks with a cane.  HENT:     Head:  Normocephalic and atraumatic.  Eyes:     General: No scleral icterus. Cardiovascular:     Rate and Rhythm: Normal rate and regular rhythm.     Heart sounds: Normal heart sounds.  Pulmonary:     Effort: Pulmonary effort is normal. No respiratory distress.     Breath sounds: No wheezing.  Abdominal:     General: Bowel sounds are normal. There is no distension.  Palpations: Abdomen is soft.  Musculoskeletal:        General: No deformity. Normal range of motion.     Cervical back: Normal range of motion and neck supple.  Skin:    General: Skin is warm and dry.     Findings: No erythema or rash.  Neurological:     Mental Status: He is alert and oriented to person, place, and time. Mental status is at baseline.     Cranial Nerves: No cranial nerve deficit.     Coordination: Coordination normal.  Psychiatric:        Mood and Affect: Mood normal.     LABORATORY DATA:  I have reviewed the data as listed Lab Results  Component Value Date   WBC 4.5 05/24/2020   HGB 13.7 05/24/2020   HCT 40.8 05/24/2020   MCV 87.2 05/24/2020   PLT 231 05/24/2020   Recent Labs    09/22/19 1038 09/30/19 0353 10/01/19 0416 03/16/20 1129 05/10/20 0834 05/17/20 0910 05/24/20 0843  NA 141 141 139   < > 140 140 139  K 4.5 4.5 3.9   < > 4.2 4.3 4.4  CL 107 108 107   < > 105 105 105  CO2 25 26 25    < > 23 25 26   GLUCOSE 113* 107* 92   < > 101* 102* 142*  BUN 29* 24* 26*   < > 28* 25* 25*  CREATININE 1.81* 1.66* 1.51*   < > 1.85* 1.85* 1.62*  CALCIUM 9.1 8.4* 8.4*   < > 8.8* 9.0 8.8*  GFRNONAA 33* 36* 41*   < > 34* 34* 40*  GFRAA 38* 42* 47*  --   --   --   --   PROT 7.0  --   --    < > 7.1 7.1 6.8  ALBUMIN 3.9  --   --    < > 3.7 3.9 3.6  AST 19  --   --    < > 18 20 18   ALT 13  --   --    < > 10 12 11   ALKPHOS 106  --   --    < > 88 95 103  BILITOT 0.8  --   --    < > 0.8 0.5 0.5   < > = values in this interval not displayed.   Iron/TIBC/Ferritin/ %Sat No results found for: IRON, TIBC,  FERRITIN, IRONPCTSAT    RADIOGRAPHIC STUDIES: I have personally reviewed the radiological images as listed and agreed with the findings in the report. 07/24/2016  CT abdomen pelvis wo contrast Numerous bilateral cystic renal lesions, some with calcifications.These cannot be fully characterized without intravenous contrast.Punctate bilateral nephrolithiasis. Irregular bladder wall thickening with possible focal bladder wall mass posteriorly on the left. Cannot exclude bladder cancer. Recommend further evaluation with direct visualization given the patient's gross hematuria. 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 Diffuse aortic atherosclerosis. Cardiomegaly, coronary artery disease.   ASSESSMENT & PLAN:  1. Malignant neoplasm of overlapping sites of bladder ([place])   2. Encounter for antineoplastic chemotherapy   3. Stage 3b chronic kidney disease ([place])   4. Hypotension, unspecified hypotension type    Noninvasive bladder cancer, multiple recurrence Hold chemotherapy treatment due to hypotension  #Hypotension, etiology unknown.  ? hypovolemia. Patient will receive 1 L of IV fluid, with recheck of blood pressure.Post IV fluids blood pressure has improved to 144/93. Patient is discharged home with advised  of monitoring blood pressure at home closely.  If blood pressure gets low again, advised patient to seek medical advice from his cardiologist immediately or go to emergency room. .  #UA showed large leukocyte, negative nitrate, microscopic hematuria, similar to previous, culture was checked previously and was negative.   No urinary symptome. Repeat culture.  #CKD, stage III Recommend patient to avoid nephrotoxins.  Creatinine is better today.  All questions were answered. The patient knows to call the clinic with any problems questions or concerns.  Follow-up in 1 week Earlie Server, MD, PhD Hematology Oncology [facility]  [place] [facility] Pager- [phone] 05/24/2020

## 2020-05-26 LAB — URINE CULTURE

## 2020-05-31 ENCOUNTER — Inpatient Hospital Stay: Payer: Medicare Other

## 2020-05-31 ENCOUNTER — Inpatient Hospital Stay (HOSPITAL_BASED_OUTPATIENT_CLINIC_OR_DEPARTMENT_OTHER): Payer: Medicare Other | Admitting: Oncology

## 2020-05-31 ENCOUNTER — Encounter: Payer: Self-pay | Admitting: Oncology

## 2020-05-31 VITALS — BP 146/87 | HR 81 | Temp 97.4°F | Resp 16 | Wt 204.1 lb

## 2020-05-31 DIAGNOSIS — C678 Malignant neoplasm of overlapping sites of bladder: Secondary | ICD-10-CM

## 2020-05-31 DIAGNOSIS — Z5111 Encounter for antineoplastic chemotherapy: Secondary | ICD-10-CM

## 2020-05-31 DIAGNOSIS — N1832 Chronic kidney disease, stage 3b: Secondary | ICD-10-CM

## 2020-05-31 LAB — COMPREHENSIVE METABOLIC PANEL
ALT: 12 U/L (ref 0–44)
AST: 20 U/L (ref 15–41)
Albumin: 3.9 g/dL (ref 3.5–5.0)
Alkaline Phosphatase: 107 U/L (ref 38–126)
Anion gap: 11 (ref 5–15)
BUN: 23 mg/dL (ref 8–23)
CO2: 24 mmol/L (ref 22–32)
Calcium: 8.8 mg/dL — ABNORMAL LOW (ref 8.9–10.3)
Chloride: 103 mmol/L (ref 98–111)
Creatinine, Ser: 1.76 mg/dL — ABNORMAL HIGH (ref 0.61–1.24)
GFR, Estimated: 37 mL/min — ABNORMAL LOW (ref >60)
Glucose, Bld: 88 mg/dL (ref 70–99)
Potassium: 4.1 mmol/L (ref 3.5–5.1)
Sodium: 138 mmol/L (ref 135–145)
Total Bilirubin: 0.6 mg/dL (ref 0.3–1.2)
Total Protein: 7.3 g/dL (ref 6.5–8.1)

## 2020-05-31 LAB — URINALYSIS, COMPLETE (UACMP) WITH MICROSCOPIC
Bacteria, UA: NONE SEEN
Bilirubin Urine: NEGATIVE
Glucose, UA: NEGATIVE mg/dL
Hgb urine dipstick: NEGATIVE
Ketones, ur: NEGATIVE mg/dL
Nitrite: NEGATIVE
Protein, ur: 100 mg/dL — AB
Specific Gravity, Urine: 1.018 (ref 1.005–1.030)
WBC, UA: >50 WBC/hpf — ABNORMAL HIGH (ref 0–5)
pH: 5 (ref 5.0–8.0)

## 2020-05-31 LAB — CBC WITH DIFFERENTIAL/PLATELET
Abs Immature Granulocytes: 0.02 10*3/uL (ref 0.00–0.07)
Basophils Absolute: 0 10*3/uL (ref 0.0–0.1)
Basophils Relative: 0 %
Eosinophils Absolute: 0.2 10*3/uL (ref 0.0–0.5)
Eosinophils Relative: 3 %
HCT: 41.8 % (ref 39.0–52.0)
Hemoglobin: 14 g/dL (ref 13.0–17.0)
Immature Granulocytes: 0 %
Lymphocytes Relative: 15 %
Lymphs Abs: 1.1 10*3/uL (ref 0.7–4.0)
MCH: 29.5 pg (ref 26.0–34.0)
MCHC: 33.5 g/dL (ref 30.0–36.0)
MCV: 88 fL (ref 80.0–100.0)
Monocytes Absolute: 0.7 10*3/uL (ref 0.1–1.0)
Monocytes Relative: 10 %
Neutro Abs: 5.3 10*3/uL (ref 1.7–7.7)
Neutrophils Relative %: 72 %
Platelets: 281 10*3/uL (ref 150–400)
RBC: 4.75 MIL/uL (ref 4.22–5.81)
RDW: 16.2 % — ABNORMAL HIGH (ref 11.5–15.5)
WBC: 7.3 10*3/uL (ref 4.0–10.5)
nRBC: 0 % (ref 0.0–0.2)

## 2020-05-31 MED ORDER — PROCHLORPERAZINE MALEATE 10 MG PO TABS
10.0000 mg | ORAL_TABLET | Freq: Once | ORAL | Status: AC
  Administered 2020-05-31: 10 mg via ORAL
  Filled 2020-05-31: qty 1

## 2020-05-31 MED ORDER — GEMCITABINE CHEMO FOR BLADDER INSTILLATION 2000 MG
2000.0000 mg | Freq: Once | INTRAVENOUS | Status: AC
  Administered 2020-05-31: 2000 mg via INTRAVESICAL
  Filled 2020-05-31: qty 52.6

## 2020-05-31 NOTE — Progress Notes (Signed)
Stable at discharge 

## 2020-05-31 NOTE — Progress Notes (Signed)
Still has occasional epsiodes of feeling dizzy with last episode this morning.  Has an appt with cardiology tomorrow.

## 2020-05-31 NOTE — Progress Notes (Signed)
Hematology/Oncology progress note [facility] [phone] [phone]   Patient Care Team: Kirk Ruths, MD as PCP - General (Internal Medicine)  REFERRING PROVIDER: Kirk Ruths, MD  CHIEF COMPLAINTS/REASON FOR VISIT:  noninvasive bladder cancer.  HISTORY OF PRESENTING ILLNESS:   Michael Holloway is a  87 y.o.  male with PMH listed below was seen in consultation at the request of  Kirk Ruths, MD  for evaluation of noninvasive bladder cancer  Patient has a history of recurrent urothelial carcinoma and follows up with Dr. Erlene Quan.  Previously received BCG He has underwent multiple procedures including TURBT for low-grade nonmuscle invasive bladder cancer. His most recent recurrence was found on 03/26/2020, per urology Dr. Cherrie Gauze note, papillary carpeting involving the right lateral bladder wall extending towards bladder neck, at least 2.5 x 2 cm. In diameter. There is also carpeting at the dome. Less than 1 cm. Bilateral retrograde unremarkable.  Chemotherapy was instilled postoperatively, gemcitabine.  03/26/2020 Pathology showed non invasive papillary urothelia carcinoma, low grade. Muscularis propria present and uninvolved.  Given that patient has had high frequency and recurrence despite induction BCG, patient was referred to hematology oncology to establish care for evaluation of intravesical gemcitabine weekly x6 to help to reduce the frequency and severity of the recurrence.   experienced mild hematuria after recent cystoscopy and procedure.  Symptoms are gettingbetter. Appetite is fair.  Lives with his son Mild chronic fatigue ,  INTERVAL HISTORY Michael Holloway is a 87 y.o. male who has above history reviewed by me today presents for follow up visit for evaluation prior to intravesical gemcitabine treatment. Problems and complaints are listed below: Patient continues to experience intermittent dizziness  episodes, last episode was this morning.  This is a chronic issue for him, he had this problem even before he started on chemotherapy.  He has a pacemaker and he is seeing cardiologist tomorrow. Blood pressure was low last time in the clinic and patient received IV fluid. Today blood pressure is 146/87. Denies any nausea, vomiting, chest pain, shortness of breath, dysuria, fever, chills,      Review of Systems  Constitutional: Positive for fatigue. Negative for appetite change, chills, fever and unexpected weight change.  HENT:   Negative for hearing loss and voice change.   Eyes: Negative for eye problems and icterus.  Respiratory: Negative for chest tightness, cough and shortness of breath.   Cardiovascular: Negative for chest pain, leg swelling and palpitations.  Gastrointestinal: Negative for abdominal distention and abdominal pain.  Endocrine: Negative for hot flashes.  Genitourinary: Negative for difficulty urinating, dysuria and frequency.   Musculoskeletal: Negative for arthralgias.  Skin: Negative for itching and rash.  Neurological: Negative for numbness.       Intermittent dizziness episodes  Hematological: Negative for adenopathy. Does not bruise/bleed easily.  Psychiatric/Behavioral: Negative for confusion.    MEDICAL HISTORY:  Past Medical History:  Diagnosis Date  . Allergy   . Arthritis   . Bladder cancer ([place])   . Cancer ([place])    Basal Cell Skin Cancer  . Cataract   . Chronic kidney disease    Stage 3 CKD  . Dysrhythmia   . GERD (gastroesophageal reflux disease)   . Gout   . History of kidney stones   . Hypertension   . Hypotension 05/24/2020  . Neuropathy   . Presence of permanent cardiac pacemaker 2020  . Spinal stenosis     SURGICAL HISTORY: Past Surgical History:  Procedure Laterality  Date  . BACK SURGERY    . CERVICAL SPINE SURGERY      X 2  . CHOLECYSTECTOMY    . CYSTOSCOPY W/ RETROGRADES Bilateral 08/25/2016   Procedure: CYSTOSCOPY WITH  RETROGRADE PYELOGRAM;  Surgeon: Hollice Espy, MD;  Location: [facility];  Service: Urology;  Laterality: Bilateral;  . CYSTOSCOPY W/ RETROGRADES Bilateral 07/18/2019   Procedure: CYSTOSCOPY WITH RETROGRADE PYELOGRAM;  Surgeon: Hollice Espy, MD;  Location: [facility];  Service: Urology;  Laterality: Bilateral;  . CYSTOSCOPY W/ RETROGRADES Bilateral 03/26/2020   Procedure: CYSTOSCOPY WITH RETROGRADE PYELOGRAM;  Surgeon: Hollice Espy, MD;  Location: [facility];  Service: Urology;  Laterality: Bilateral;  . EYE SURGERY Bilateral    Cataract Extraction with IOL  . JOINT REPLACEMENT Bilateral    total knee replacements  . LOOP RECORDER INSERTION N/A 06/03/2017   Procedure: LOOP RECORDER INSERTION;  Surgeon: Isaias Cowman, MD;  Location: [facility];  Service: Cardiovascular;  Laterality: N/A;  . LUMBAR LAMINECTOMY      X 4  . PACEMAKER INSERTION Left 08/03/2018   Procedure: INSERTION PACEMAKER DUALCHAMBER;  Surgeon: Isaias Cowman, MD;  Location: [facility];  Service: Cardiovascular;  Laterality: Left;  . PROSTATE SURGERY  1990s   Dr. Eliberto Ivory ,in office procedure  . TONSILLECTOMY    . TOTAL KNEE ARTHROPLASTY Right 09/29/2019   Procedure: TOTAL KNEE ARTHROPLASTY;  Surgeon: Corky Mull, MD;  Location: [facility];  Service: Orthopedics;  Laterality: Right;  . TRANSURETHRAL RESECTION OF BLADDER TUMOR WITH MITOMYCIN-C N/A 08/25/2016   Procedure: TRANSURETHRAL RESECTION OF BLADDER TUMOR WITH MITOMYCIN-C;  Surgeon: Hollice Espy, MD;  Location: [facility];  Service: Urology;  Laterality: N/A;  . TRANSURETHRAL RESECTION OF BLADDER TUMOR WITH MITOMYCIN-C N/A 07/18/2019   Procedure: TRANSURETHRAL RESECTION OF BLADDER TUMOR WITH gemcitabine;  Surgeon: Hollice Espy, MD;  Location: [facility];  Service: Urology;  Laterality: N/A;  . TRANSURETHRAL RESECTION OF BLADDER TUMOR WITH MITOMYCIN-C N/A 03/26/2020   Procedure: TRANSURETHRAL RESECTION OF BLADDER TUMOR WITH gemcitabine;  Surgeon: Hollice Espy, MD;  Location: [facility];  Service: Urology;  Laterality: N/A;  . TRANSURETHRAL RESECTION OF PROSTATE N/A 06/14/2018   Procedure: TRANSURETHRAL RESECTION OF THE PROSTATE (TURP) with Gemcitabine;  Surgeon: Hollice Espy, MD;  Location: [facility];  Service: Urology;  Laterality: N/A;    SOCIAL HISTORY: Social History   Socioeconomic History  . Marital status: Widowed    Spouse name: Not on file  . Number of children: Not on file  . Years of education: Not on file  . Highest education level: Not on file  Occupational History  . Occupation: Programmer, applications  Tobacco Use  . Smoking status: Former Smoker    Packs/day: 0.50    Years: 30.00    Pack years: 15.00    Types: Cigarettes    Quit date: 08/11/1978    Years since quitting: 41.8  . Smokeless tobacco: Never Used  Vaping Use  . Vaping Use: Never used  Substance and Sexual Activity  . Alcohol use: Not Currently    Comment: 1 BEER DAILY  . Drug use: No  . Sexual activity: Not Currently  Other Topics Concern  . Not on file  Social History Narrative   Patient lives with his son, Peder.  He feels safe in his home.   Very active. Goes to the [employer] regularly, works on his computer and putters around his home.   Independant   Social Determinants of Health   Financial Resource Strain: Not on file  Food Insecurity: Not on file  Transportation Needs: Not on file  Physical Activity: Not on file  Stress: Not on file  Social Connections: Not on file  Intimate Partner Violence: Not on file    FAMILY HISTORY: Family History  Problem Relation Age of Onset  . Cancer Mother   . Stroke Father   . Bladder Cancer Neg Hx   . Kidney cancer Neg Hx   . Prostate cancer Neg Hx     ALLERGIES:  is allergic to sulfa antibiotics.  MEDICATIONS:  Current Outpatient Medications  Medication Sig Dispense Refill  . allopurinol (ZYLOPRIM) 100 MG tablet Take 100 mg by mouth daily.     Marland Kitchen aspirin EC 81 MG tablet Take 81 mg  by mouth daily.    . Biotin 10 MG CAPS Take 10 mg by mouth daily.     . cetirizine (ZYRTEC) 10 MG tablet Take 10 mg by mouth every morning.     Mariane Baumgarten Calcium (STOOL SOFTENER PO) Take 1 tablet by mouth daily.     Marland Kitchen gabapentin (NEURONTIN) 600 MG tablet Take 600 mg by mouth See admin instructions. Take 600 mg by mouth in the morning and afternoon and 900 mg at bedtime    . omeprazole (PRILOSEC) 20 MG capsule Take 20 mg by mouth every morning.     . traMADol (ULTRAM) 50 MG tablet Take 50 mg by mouth 3 (three) times daily as needed for moderate pain.     . Xylitol (XYLIMELTS MT) Use as directed 2 tablets in the mouth or throat at bedtime.    . Sodium Chloride-Xylitol (XLEAR SINUS CARE SPRAY NA) Place 1 spray into the nose daily. (Patient not taking: No sig reported)     Current Facility-Administered Medications  Medication Dose Route Frequency Provider Last Rate Last Admin  . gemcitabine (GEMZAR) 2,000 mg in sodium chloride irrigation 0.9 % chemo infusion  2,000 mg Irrigation Once Hollice Espy, MD      . gemcitabine [facility]) chemo syringe for bladder instillation 2,000 mg  2,000 mg Bladder Instillation Once Hollice Espy, MD      . gemcitabine [facility]) chemo syringe for bladder instillation 2,000 mg  2,000 mg Bladder Instillation Once Hollice Espy, MD      . lidocaine (XYLOCAINE) 2 % (with pres) injection 1,000 mg  50 mL Other Once Hollice Espy, MD      . lidocaine (XYLOCAINE) 2 % jelly 1 application  1 application Urethral Once Hollice Espy, MD       Facility-Administered Medications Ordered in Other Visits  Medication Dose Route Frequency Provider Last Rate Last Admin  . 0.9 %  sodium chloride infusion   Intravenous Once Earlie Server, MD         PHYSICAL EXAMINATION: ECOG PERFORMANCE STATUS: 1 - Symptomatic but completely ambulatory Vitals:   05/31/20 0918  BP: (!) 146/87  Pulse: 81  Resp: 16  Temp: (!) 97.4 F (36.3 C)   Filed Weights   05/31/20 0918  Weight: 204 lb  1.6 oz (92.6 kg)    Physical Exam Constitutional:      General: He is not in acute distress.    Comments: Patient walks with a cane.  HENT:     Head: Normocephalic and atraumatic.  Eyes:     General: No scleral icterus. Cardiovascular:     Rate and Rhythm: Normal rate and regular rhythm.     Heart sounds: Normal heart sounds.  Pulmonary:     Effort: Pulmonary effort is normal.  No respiratory distress.     Breath sounds: No wheezing.  Abdominal:     General: Bowel sounds are normal. There is no distension.     Palpations: Abdomen is soft.  Musculoskeletal:        General: No deformity. Normal range of motion.     Cervical back: Normal range of motion and neck supple.  Skin:    General: Skin is warm and dry.     Findings: No erythema or rash.  Neurological:     Mental Status: He is alert and oriented to person, place, and time. Mental status is at baseline.     Cranial Nerves: No cranial nerve deficit.     Coordination: Coordination normal.  Psychiatric:        Mood and Affect: Mood normal.     LABORATORY DATA:  I have reviewed the data as listed Lab Results  Component Value Date   WBC 7.3 05/31/2020   HGB 14.0 05/31/2020   HCT 41.8 05/31/2020   MCV 88.0 05/31/2020   PLT 281 05/31/2020   Recent Labs    09/22/19 1038 09/30/19 0353 10/01/19 0416 03/16/20 1129 05/17/20 0910 05/24/20 0843 05/31/20 0855  NA 141 141 139   < > 140 139 138  K 4.5 4.5 3.9   < > 4.3 4.4 4.1  CL 107 108 107   < > 105 105 103  CO2 25 26 25    < > 25 26 24   GLUCOSE 113* 107* 92   < > 102* 142* 88  BUN 29* 24* 26*   < > 25* 25* 23  CREATININE 1.81* 1.66* 1.51*   < > 1.85* 1.62* 1.76*  CALCIUM 9.1 8.4* 8.4*   < > 9.0 8.8* 8.8*  GFRNONAA 33* 36* 41*   < > 34* 40* 37*  GFRAA 38* 42* 47*  --   --   --   --   PROT 7.0  --   --    < > 7.1 6.8 7.3  ALBUMIN 3.9  --   --    < > 3.9 3.6 3.9  AST 19  --   --    < > 20 18 20   ALT 13  --   --    < > 12 11 12   ALKPHOS 106  --   --    < > 95 103  107  BILITOT 0.8  --   --    < > 0.5 0.5 0.6   < > = values in this interval not displayed.   Iron/TIBC/Ferritin/ %Sat No results found for: IRON, TIBC, FERRITIN, IRONPCTSAT    RADIOGRAPHIC STUDIES: I have personally reviewed the radiological images as listed and agreed with the findings in the report. 07/24/2016  CT abdomen pelvis wo contrast Numerous bilateral cystic renal lesions, some with calcifications.These cannot be fully characterized without intravenous contrast.Punctate bilateral nephrolithiasis. Irregular bladder wall thickening with possible focal bladder wall mass posteriorly on the left. Cannot exclude bladder cancer. Recommend further evaluation with direct visualization given the patient's gross hematuria. 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 Diffuse aortic atherosclerosis. Cardiomegaly, coronary artery disease.   ASSESSMENT & PLAN:  1. Encounter for antineoplastic chemotherapy   2. Malignant neoplasm of overlapping sites of bladder ([place])   3. Stage 3b chronic kidney disease ([facility])    Noninvasive bladder cancer, multiple recurrence Labs are reviewed and discussed with patient.  Counts are stable .  Proceed with intravesical gemcitabine  today.  #Hypotension, resolved.. Intermittent dizziness episodes, patient will see cardiology tomorrow to go over the data integrated from his cardiac monitor.  #UA showed large leukocyte, negative nitrate, microscopic hematuria, similar to previous collections. Previous culture showed multiple species present.  Patient has no dysuria.  Monitor for  #CKD, stage III Recommend patient to avoid nephrotoxins.   Stable creatinine all questions were answered. The patient knows to call the clinic with any problems questions or concerns.  Follow-up in 1 week Earlie Server, MD, PhD Hematology Oncology [facility] Pager- [phone] 05/31/2020

## 2020-06-07 ENCOUNTER — Other Ambulatory Visit: Payer: Self-pay

## 2020-06-07 ENCOUNTER — Inpatient Hospital Stay: Payer: Medicare Other

## 2020-06-07 ENCOUNTER — Encounter: Payer: Self-pay | Admitting: Oncology

## 2020-06-07 ENCOUNTER — Inpatient Hospital Stay (HOSPITAL_BASED_OUTPATIENT_CLINIC_OR_DEPARTMENT_OTHER): Payer: Medicare Other | Admitting: Oncology

## 2020-06-07 VITALS — BP 160/88 | HR 80 | Temp 97.3°F | Resp 16 | Wt 204.9 lb

## 2020-06-07 DIAGNOSIS — N1832 Chronic kidney disease, stage 3b: Secondary | ICD-10-CM

## 2020-06-07 DIAGNOSIS — Z5111 Encounter for antineoplastic chemotherapy: Secondary | ICD-10-CM | POA: Diagnosis not present

## 2020-06-07 DIAGNOSIS — C678 Malignant neoplasm of overlapping sites of bladder: Secondary | ICD-10-CM

## 2020-06-07 LAB — CBC WITH DIFFERENTIAL/PLATELET
Abs Immature Granulocytes: 0.01 10*3/uL (ref 0.00–0.07)
Basophils Absolute: 0 10*3/uL (ref 0.0–0.1)
Basophils Relative: 1 %
Eosinophils Absolute: 0.1 10*3/uL (ref 0.0–0.5)
Eosinophils Relative: 2 %
HCT: 39.9 % (ref 39.0–52.0)
Hemoglobin: 13.2 g/dL (ref 13.0–17.0)
Immature Granulocytes: 0 %
Lymphocytes Relative: 23 %
Lymphs Abs: 1.2 10*3/uL (ref 0.7–4.0)
MCH: 29.2 pg (ref 26.0–34.0)
MCHC: 33.1 g/dL (ref 30.0–36.0)
MCV: 88.3 fL (ref 80.0–100.0)
Monocytes Absolute: 0.6 10*3/uL (ref 0.1–1.0)
Monocytes Relative: 11 %
Neutro Abs: 3.1 10*3/uL (ref 1.7–7.7)
Neutrophils Relative %: 63 %
Platelets: 322 10*3/uL (ref 150–400)
RBC: 4.52 MIL/uL (ref 4.22–5.81)
RDW: 15.9 % — ABNORMAL HIGH (ref 11.5–15.5)
WBC: 5 10*3/uL (ref 4.0–10.5)
nRBC: 0 % (ref 0.0–0.2)

## 2020-06-07 LAB — COMPREHENSIVE METABOLIC PANEL
ALT: 12 U/L (ref 0–44)
AST: 19 U/L (ref 15–41)
Albumin: 3.8 g/dL (ref 3.5–5.0)
Alkaline Phosphatase: 101 U/L (ref 38–126)
Anion gap: 8 (ref 5–15)
BUN: 28 mg/dL — ABNORMAL HIGH (ref 8–23)
CO2: 26 mmol/L (ref 22–32)
Calcium: 8.7 mg/dL — ABNORMAL LOW (ref 8.9–10.3)
Chloride: 105 mmol/L (ref 98–111)
Creatinine, Ser: 1.63 mg/dL — ABNORMAL HIGH (ref 0.61–1.24)
GFR, Estimated: 40 mL/min — ABNORMAL LOW (ref >60)
Glucose, Bld: 110 mg/dL — ABNORMAL HIGH (ref 70–99)
Potassium: 4.5 mmol/L (ref 3.5–5.1)
Sodium: 139 mmol/L (ref 135–145)
Total Bilirubin: 0.5 mg/dL (ref 0.3–1.2)
Total Protein: 7.1 g/dL (ref 6.5–8.1)

## 2020-06-07 LAB — URINALYSIS, COMPLETE (UACMP) WITH MICROSCOPIC
Bilirubin Urine: NEGATIVE
Glucose, UA: NEGATIVE mg/dL
Hgb urine dipstick: NEGATIVE
Ketones, ur: NEGATIVE mg/dL
Nitrite: NEGATIVE
Protein, ur: 30 mg/dL — AB
Specific Gravity, Urine: 1.011 (ref 1.005–1.030)
pH: 6 (ref 5.0–8.0)

## 2020-06-07 MED ORDER — PROCHLORPERAZINE MALEATE 10 MG PO TABS
10.0000 mg | ORAL_TABLET | Freq: Once | ORAL | Status: AC
  Administered 2020-06-07: 10 mg via ORAL
  Filled 2020-06-07: qty 1

## 2020-06-07 MED ORDER — GEMCITABINE CHEMO FOR BLADDER INSTILLATION 2000 MG
2000.0000 mg | Freq: Once | INTRAVENOUS | Status: AC
  Administered 2020-06-07: 2000 mg via INTRAVESICAL
  Filled 2020-06-07: qty 52.6

## 2020-06-07 NOTE — Progress Notes (Signed)
Hematology/Oncology progress note [facility] Telephone:([phone] Fax:(336) [phone]   Patient Care Team: Kirk Ruths, MD as PCP - General (Internal Medicine)  REFERRING PROVIDER: Kirk Ruths, MD  CHIEF COMPLAINTS/REASON FOR VISIT:  noninvasive bladder cancer.  HISTORY OF PRESENTING ILLNESS:   Michael Holloway is a  87 y.o.  male with PMH listed below was seen in consultation at the request of  Kirk Ruths, MD  for evaluation of noninvasive bladder cancer  Patient has a history of recurrent urothelial carcinoma and follows up with Dr. Erlene Quan.  Previously received BCG He has underwent multiple procedures including TURBT for low-grade nonmuscle invasive bladder cancer. His most recent recurrence was found on 03/26/2020, per urology Dr. Cherrie Gauze note, papillary carpeting involving the right lateral bladder wall extending towards bladder neck, at least 2.5 x 2 cm. In diameter. There is also carpeting at the dome. Less than 1 cm. Bilateral retrograde unremarkable.  Chemotherapy was instilled postoperatively, gemcitabine.  03/26/2020 Pathology showed non invasive papillary urothelia carcinoma, low grade. Muscularis propria present and uninvolved.  Given that patient has had high frequency and recurrence despite induction BCG, patient was referred to hematology oncology to establish care for evaluation of intravesical gemcitabine weekly x6 to help to reduce the frequency and severity of the recurrence.   experienced mild hematuria after recent cystoscopy and procedure.  Symptoms are gettingbetter. Appetite is fair.  Lives with his son Mild chronic fatigue ,  INTERVAL HISTORY Michael Holloway is a 87 y.o. male who has above history reviewed by me today presents for follow up visit for evaluation prior to intravesical gemcitabine treatment. Problems and complaints are listed below: Denies any nausea, vomiting, chest pain, shortness of  breath, dysuria, fever, chills, Patient was recently seen by his cardiologist with his pacemaker interrogated. He was advised to stay hydrated. Today no dizziness, lightheadedness.     Review of Systems  Constitutional: Positive for fatigue. Negative for appetite change, chills, fever and unexpected weight change.  HENT:   Negative for hearing loss and voice change.   Eyes: Negative for eye problems and icterus.  Respiratory: Negative for chest tightness, cough and shortness of breath.   Cardiovascular: Negative for chest pain, leg swelling and palpitations.  Gastrointestinal: Negative for abdominal distention and abdominal pain.  Endocrine: Negative for hot flashes.  Genitourinary: Negative for difficulty urinating, dysuria and frequency.   Musculoskeletal: Negative for arthralgias.  Skin: Negative for itching and rash.  Neurological: Negative for numbness.       Intermittent dizziness episodes  Hematological: Negative for adenopathy. Does not bruise/bleed easily.  Psychiatric/Behavioral: Negative for confusion.    MEDICAL HISTORY:  Past Medical History:  Diagnosis Date  . Allergy   . Arthritis   . Bladder cancer ([place])   . Cancer ([place])    Basal Cell Skin Cancer  . Cataract   . Chronic kidney disease    Stage 3 CKD  . Dysrhythmia   . GERD (gastroesophageal reflux disease)   . Gout   . History of kidney stones   . Hypertension   . Hypotension 05/24/2020  . Neuropathy   . Presence of permanent cardiac pacemaker 2020  . Spinal stenosis     SURGICAL HISTORY: Past Surgical History:  Procedure Laterality Date  . BACK SURGERY    . CERVICAL SPINE SURGERY      X 2  . CHOLECYSTECTOMY    . CYSTOSCOPY W/ RETROGRADES Bilateral 08/25/2016   Procedure: CYSTOSCOPY WITH RETROGRADE PYELOGRAM;  Surgeon:  Hollice Espy, MD;  Location: [facility];  Service: Urology;  Laterality: Bilateral;  . CYSTOSCOPY W/ RETROGRADES Bilateral 07/18/2019   Procedure: CYSTOSCOPY WITH RETROGRADE  PYELOGRAM;  Surgeon: Hollice Espy, MD;  Location: [facility];  Service: Urology;  Laterality: Bilateral;  . CYSTOSCOPY W/ RETROGRADES Bilateral 03/26/2020   Procedure: CYSTOSCOPY WITH RETROGRADE PYELOGRAM;  Surgeon: Hollice Espy, MD;  Location: [facility];  Service: Urology;  Laterality: Bilateral;  . EYE SURGERY Bilateral    Cataract Extraction with IOL  . JOINT REPLACEMENT Bilateral    total knee replacements  . LOOP RECORDER INSERTION N/A 06/03/2017   Procedure: LOOP RECORDER INSERTION;  Surgeon: Isaias Cowman, MD;  Location: [place] [facility];  Service: Cardiovascular;  Laterality: N/A;  . LUMBAR LAMINECTOMY      X 4  . PACEMAKER INSERTION Left 08/03/2018   Procedure: INSERTION PACEMAKER DUALCHAMBER;  Surgeon: Isaias Cowman, MD;  Location: [facility];  Service: Cardiovascular;  Laterality: Left;  . PROSTATE SURGERY  1990s   Dr. Eliberto Ivory ,in office procedure  . TONSILLECTOMY    . TOTAL KNEE ARTHROPLASTY Right 09/29/2019   Procedure: TOTAL KNEE ARTHROPLASTY;  Surgeon: Corky Mull, MD;  Location: [facility];  Service: Orthopedics;  Laterality: Right;  . TRANSURETHRAL RESECTION OF BLADDER TUMOR WITH MITOMYCIN-C N/A 08/25/2016   Procedure: TRANSURETHRAL RESECTION OF BLADDER TUMOR WITH MITOMYCIN-C;  Surgeon: Hollice Espy, MD;  Location: [facility];  Service: Urology;  Laterality: N/A;  . TRANSURETHRAL RESECTION OF BLADDER TUMOR WITH MITOMYCIN-C N/A 07/18/2019   Procedure: TRANSURETHRAL RESECTION OF BLADDER TUMOR WITH gemcitabine;  Surgeon: Hollice Espy, MD;  Location: [facility];  Service: Urology;  Laterality: N/A;  . TRANSURETHRAL RESECTION OF BLADDER TUMOR WITH MITOMYCIN-C N/A 03/26/2020   Procedure: TRANSURETHRAL RESECTION OF BLADDER TUMOR WITH gemcitabine;  Surgeon: Hollice Espy, MD;  Location: [facility];  Service: Urology;  Laterality: N/A;  . TRANSURETHRAL RESECTION OF PROSTATE N/A 06/14/2018   Procedure: TRANSURETHRAL RESECTION OF THE PROSTATE (TURP) with Gemcitabine;  Surgeon:  Hollice Espy, MD;  Location: [facility];  Service: Urology;  Laterality: N/A;    SOCIAL HISTORY: Social History   Socioeconomic History  . Marital status: Widowed    Spouse name: Not on file  . Number of children: Not on file  . Years of education: Not on file  . Highest education level: Not on file  Occupational History  . Occupation: Programmer, applications  Tobacco Use  . Smoking status: Former Smoker    Packs/day: 0.50    Years: 30.00    Pack years: 15.00    Types: Cigarettes    Quit date: 08/11/1978    Years since quitting: 41.8  . Smokeless tobacco: Never Used  Vaping Use  . Vaping Use: Never used  Substance and Sexual Activity  . Alcohol use: Not Currently    Comment: 1 BEER DAILY  . Drug use: No  . Sexual activity: Not Currently  Other Topics Concern  . Not on file  Social History Narrative   Patient lives with his son, Jacen.  He feels safe in his home.   Very active. Goes to the [employer] regularly, works on his computer and putters around his home.   Independant   Social Determinants of Health   Financial Resource Strain: Not on file  Food Insecurity: Not on file  Transportation Needs: Not on file  Physical Activity: Not on file  Stress: Not on file  Social Connections: Not on file  Intimate Partner Violence: Not on file    FAMILY  HISTORY: Family History  Problem Relation Age of Onset  . Cancer Mother   . Stroke Father   . Bladder Cancer Neg Hx   . Kidney cancer Neg Hx   . Prostate cancer Neg Hx     ALLERGIES:  is allergic to sulfa antibiotics.  MEDICATIONS:  Current Outpatient Medications  Medication Sig Dispense Refill  . allopurinol (ZYLOPRIM) 100 MG tablet Take 100 mg by mouth daily.     Marland Kitchen aspirin EC 81 MG tablet Take 81 mg by mouth daily.    . Biotin 10 MG CAPS Take 10 mg by mouth daily.     . cetirizine (ZYRTEC) 10 MG tablet Take 10 mg by mouth every morning.     Mariane Baumgarten Calcium (STOOL SOFTENER PO) Take 1 tablet by mouth  daily.     Marland Kitchen gabapentin (NEURONTIN) 600 MG tablet Take 600 mg by mouth See admin instructions. Take 600 mg by mouth in the morning and afternoon and 900 mg at bedtime    . omeprazole (PRILOSEC) 20 MG capsule Take 20 mg by mouth every morning.     . Sodium Chloride-Xylitol (XLEAR SINUS CARE SPRAY NA) Place 1 spray into the nose daily. (Patient not taking: No sig reported)    . traMADol (ULTRAM) 50 MG tablet Take 50 mg by mouth 3 (three) times daily as needed for moderate pain.     . Xylitol (XYLIMELTS MT) Use as directed 2 tablets in the mouth or throat at bedtime.     Current Facility-Administered Medications  Medication Dose Route Frequency Provider Last Rate Last Admin  . gemcitabine (GEMZAR) 2,000 mg in sodium chloride irrigation 0.9 % chemo infusion  2,000 mg Irrigation Once Hollice Espy, MD      . gemcitabine [facility]) chemo syringe for bladder instillation 2,000 mg  2,000 mg Bladder Instillation Once Hollice Espy, MD      . gemcitabine [facility]) chemo syringe for bladder instillation 2,000 mg  2,000 mg Bladder Instillation Once Hollice Espy, MD      . lidocaine (XYLOCAINE) 2 % (with pres) injection 1,000 mg  50 mL Other Once Hollice Espy, MD      . lidocaine (XYLOCAINE) 2 % jelly 1 application  1 application Urethral Once Hollice Espy, MD       Facility-Administered Medications Ordered in Other Visits  Medication Dose Route Frequency Provider Last [place]  . 0.9 %  sodium chloride infusion   Intravenous Once Earlie Server, MD         PHYSICAL EXAMINATION: ECOG PERFORMANCE STATUS: 1 - Symptomatic but completely ambulatory Vitals:   06/07/20 0916  BP: (!) 160/88  Pulse: 80  Resp: 16  Temp: (!) 97.3 F (36.3 C)   Filed Weights   06/07/20 0916  Weight: 204 lb 14.4 oz (92.9 kg)    Physical Exam Constitutional:      General: He is not in acute distress.    Comments: Patient walks with a cane.  HENT:     Head: Normocephalic and atraumatic.  Eyes:     General:  No scleral icterus. Cardiovascular:     Rate and Rhythm: Normal rate and regular rhythm.     Heart sounds: Normal heart sounds.  Pulmonary:     Effort: Pulmonary effort is normal. No respiratory distress.     Breath sounds: No wheezing.  Abdominal:     General: Bowel sounds are normal. There is no distension.     Palpations: Abdomen is soft.  Musculoskeletal:  General: No deformity. Normal range of motion.     Cervical back: Normal range of motion and neck supple.  Skin:    General: Skin is warm and dry.     Findings: No erythema or rash.  Neurological:     Mental Status: He is alert and oriented to person, place, and time. Mental status is at baseline.     Cranial Nerves: No cranial nerve deficit.     Coordination: Coordination normal.  Psychiatric:        Mood and Affect: Mood normal.     LABORATORY DATA:  I have reviewed the data as listed Lab Results  Component Value Date   WBC 7.3 05/31/2020   HGB 14.0 05/31/2020   HCT 41.8 05/31/2020   MCV 88.0 05/31/2020   PLT 281 05/31/2020   Recent Labs    09/22/19 1038 09/30/19 0353 10/01/19 0416 03/16/20 1129 05/17/20 0910 05/24/20 0843 05/31/20 0855  NA 141 141 139   < > 140 139 138  K 4.5 4.5 3.9   < > 4.3 4.4 4.1  CL 107 108 107   < > 105 105 103  CO2 25 26 25    < > 25 26 24   GLUCOSE 113* 107* 92   < > 102* 142* 88  BUN 29* 24* 26*   < > 25* 25* 23  CREATININE 1.81* 1.66* 1.51*   < > 1.85* 1.62* 1.76*  CALCIUM 9.1 8.4* 8.4*   < > 9.0 8.8* 8.8*  GFRNONAA 33* 36* 41*   < > 34* 40* 37*  GFRAA 38* 42* 47*  --   --   --   --   PROT 7.0  --   --    < > 7.1 6.8 7.3  ALBUMIN 3.9  --   --    < > 3.9 3.6 3.9  AST 19  --   --    < > 20 18 20   ALT 13  --   --    < > 12 11 12   ALKPHOS 106  --   --    < > 95 103 107  BILITOT 0.8  --   --    < > 0.5 0.5 0.6   < > = values in this interval not displayed.   Iron/TIBC/Ferritin/ %Sat No results found for: IRON, TIBC, FERRITIN, IRONPCTSAT    RADIOGRAPHIC STUDIES: I  have personally reviewed the radiological images as listed and agreed with the findings in the report. 07/24/2016  CT abdomen pelvis wo contrast Numerous bilateral cystic renal lesions, some with calcifications.These cannot be fully characterized without intravenous contrast.Punctate bilateral nephrolithiasis. Irregular bladder wall thickening with possible focal bladder wall mass posteriorly on the left. Cannot exclude bladder cancer. Recommend further evaluation with direct visualization given the patient's gross hematuria. 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 Diffuse aortic atherosclerosis. Cardiomegaly, coronary artery disease.   ASSESSMENT & PLAN:  1. Malignant neoplasm of overlapping sites of bladder ([employer])   2. Encounter for antineoplastic chemotherapy   3. Stage 3b chronic kidney disease (H[facility])    Noninvasive bladder cancer, multiple recurrence Labs are reviewed and discussed with patient. Proceed with intravesical gemcitabine today.  Intermittent dizziness episodes, probably due to dehydration episodes.  Encourage oral hydration. #UA showed small leukocyte, negative nitrate, similar to previous collections.  Patient is asymptomatic.   #CKD, stage III, creatinine is slightly better.  Encourage oral hydration. Recommend patient to avoid nephrotoxins.   Stable  creatinine all questions were answered. The patient knows to call the clinic with any problems questions or concerns.  Follow-up in 1 week Earlie Server, MD, PhD Hematology Oncology [facility] Pager- [phone] 06/07/2020

## 2020-06-07 NOTE — Progress Notes (Signed)
Patient has been drinking more water and the dizzy episodes are not as frequent.

## 2020-06-07 NOTE — Progress Notes (Signed)
Patient tolerated treatment well. Catheter unclamped at 1133. Drained for approx. 20 minutes with 375cc urine. Patient stable at discharge. Discharged home.

## 2020-06-14 ENCOUNTER — Inpatient Hospital Stay: Payer: Medicare Other | Attending: Oncology

## 2020-06-14 ENCOUNTER — Encounter: Payer: Self-pay | Admitting: Oncology

## 2020-06-14 ENCOUNTER — Inpatient Hospital Stay (HOSPITAL_BASED_OUTPATIENT_CLINIC_OR_DEPARTMENT_OTHER): Payer: Medicare Other | Admitting: Oncology

## 2020-06-14 ENCOUNTER — Inpatient Hospital Stay: Payer: Medicare Other

## 2020-06-14 ENCOUNTER — Other Ambulatory Visit: Payer: Self-pay

## 2020-06-14 VITALS — BP 115/74 | HR 91 | Temp 97.5°F | Resp 18 | Wt 205.5 lb

## 2020-06-14 DIAGNOSIS — C678 Malignant neoplasm of overlapping sites of bladder: Secondary | ICD-10-CM

## 2020-06-14 DIAGNOSIS — N1832 Chronic kidney disease, stage 3b: Secondary | ICD-10-CM | POA: Diagnosis not present

## 2020-06-14 DIAGNOSIS — Z5111 Encounter for antineoplastic chemotherapy: Secondary | ICD-10-CM

## 2020-06-14 DIAGNOSIS — Z87891 Personal history of nicotine dependence: Secondary | ICD-10-CM | POA: Diagnosis not present

## 2020-06-14 DIAGNOSIS — Z9049 Acquired absence of other specified parts of digestive tract: Secondary | ICD-10-CM | POA: Diagnosis not present

## 2020-06-14 DIAGNOSIS — K219 Gastro-esophageal reflux disease without esophagitis: Secondary | ICD-10-CM | POA: Insufficient documentation

## 2020-06-14 DIAGNOSIS — Z87442 Personal history of urinary calculi: Secondary | ICD-10-CM | POA: Insufficient documentation

## 2020-06-14 DIAGNOSIS — Z85828 Personal history of other malignant neoplasm of skin: Secondary | ICD-10-CM | POA: Diagnosis not present

## 2020-06-14 DIAGNOSIS — Z7982 Long term (current) use of aspirin: Secondary | ICD-10-CM | POA: Insufficient documentation

## 2020-06-14 DIAGNOSIS — Z9079 Acquired absence of other genital organ(s): Secondary | ICD-10-CM | POA: Insufficient documentation

## 2020-06-14 DIAGNOSIS — I10 Essential (primary) hypertension: Secondary | ICD-10-CM | POA: Diagnosis not present

## 2020-06-14 DIAGNOSIS — Z79899 Other long term (current) drug therapy: Secondary | ICD-10-CM | POA: Diagnosis not present

## 2020-06-14 LAB — COMPREHENSIVE METABOLIC PANEL
ALT: 11 U/L (ref 0–44)
AST: 21 U/L (ref 15–41)
Albumin: 3.8 g/dL (ref 3.5–5.0)
Alkaline Phosphatase: 94 U/L (ref 38–126)
Anion gap: 9 (ref 5–15)
BUN: 24 mg/dL — ABNORMAL HIGH (ref 8–23)
CO2: 26 mmol/L (ref 22–32)
Calcium: 8.7 mg/dL — ABNORMAL LOW (ref 8.9–10.3)
Chloride: 106 mmol/L (ref 98–111)
Creatinine, Ser: 1.81 mg/dL — ABNORMAL HIGH (ref 0.61–1.24)
GFR, Estimated: 35 mL/min — ABNORMAL LOW (ref >60)
Glucose, Bld: 121 mg/dL — ABNORMAL HIGH (ref 70–99)
Potassium: 4.6 mmol/L (ref 3.5–5.1)
Sodium: 141 mmol/L (ref 135–145)
Total Bilirubin: 0.5 mg/dL (ref 0.3–1.2)
Total Protein: 7.1 g/dL (ref 6.5–8.1)

## 2020-06-14 LAB — CBC WITH DIFFERENTIAL/PLATELET
Abs Immature Granulocytes: 0.01 10*3/uL (ref 0.00–0.07)
Basophils Absolute: 0 10*3/uL (ref 0.0–0.1)
Basophils Relative: 1 %
Eosinophils Absolute: 0.1 10*3/uL (ref 0.0–0.5)
Eosinophils Relative: 2 %
HCT: 40.2 % (ref 39.0–52.0)
Hemoglobin: 13.4 g/dL (ref 13.0–17.0)
Immature Granulocytes: 0 %
Lymphocytes Relative: 19 %
Lymphs Abs: 1.1 10*3/uL (ref 0.7–4.0)
MCH: 29.7 pg (ref 26.0–34.0)
MCHC: 33.3 g/dL (ref 30.0–36.0)
MCV: 89.1 fL (ref 80.0–100.0)
Monocytes Absolute: 0.4 10*3/uL (ref 0.1–1.0)
Monocytes Relative: 7 %
Neutro Abs: 3.9 10*3/uL (ref 1.7–7.7)
Neutrophils Relative %: 71 %
Platelets: 227 10*3/uL (ref 150–400)
RBC: 4.51 MIL/uL (ref 4.22–5.81)
RDW: 16.2 % — ABNORMAL HIGH (ref 11.5–15.5)
WBC: 5.5 10*3/uL (ref 4.0–10.5)
nRBC: 0 % (ref 0.0–0.2)

## 2020-06-14 LAB — URINALYSIS, COMPLETE (UACMP) WITH MICROSCOPIC
Bilirubin Urine: NEGATIVE
Glucose, UA: NEGATIVE mg/dL
Hgb urine dipstick: NEGATIVE
Ketones, ur: NEGATIVE mg/dL
Nitrite: NEGATIVE
Protein, ur: 30 mg/dL — AB
Specific Gravity, Urine: 1.01 (ref 1.005–1.030)
Squamous Epithelial / HPF: NONE SEEN (ref 0–5)
pH: 6 (ref 5.0–8.0)

## 2020-06-14 MED ORDER — PROCHLORPERAZINE MALEATE 10 MG PO TABS
10.0000 mg | ORAL_TABLET | Freq: Once | ORAL | Status: AC
  Administered 2020-06-14: 10 mg via ORAL
  Filled 2020-06-14: qty 1

## 2020-06-14 MED ORDER — GEMCITABINE CHEMO FOR BLADDER INSTILLATION 2000 MG
2000.0000 mg | Freq: Once | INTRAVENOUS | Status: AC
  Administered 2020-06-14: 2000 mg via INTRAVESICAL
  Filled 2020-06-14: qty 52.6

## 2020-06-14 NOTE — Progress Notes (Signed)
Hematology/Oncology progress note [facility] Telephone:([phone] [phone]   Patient Care Team: Kirk Ruths, MD as PCP - General (Internal Medicine) Earlie Server, MD as Consulting Physician (Hematology and Oncology)  REFERRING PROVIDER: Kirk Ruths, MD  CHIEF COMPLAINTS/REASON FOR VISIT:  noninvasive bladder cancer.  HISTORY OF PRESENTING ILLNESS:   Michael Holloway is a  87 y.o.  male with PMH listed below was seen in consultation at the request of  Kirk Ruths, MD  for evaluation of noninvasive bladder cancer  Patient has a history of recurrent urothelial carcinoma and follows up with Dr. Erlene Quan.  Previously received BCG He has underwent multiple procedures including TURBT for low-grade nonmuscle invasive bladder cancer. His most recent recurrence was found on 03/26/2020, per urology Dr. Cherrie Gauze note, papillary carpeting involving the right lateral bladder wall extending towards bladder neck, at least 2.5 x 2 cm. In diameter. There is also carpeting at the dome. Less than 1 cm. Bilateral retrograde unremarkable.  Chemotherapy was instilled postoperatively, gemcitabine.  03/26/2020 Pathology showed non invasive papillary urothelia carcinoma, low grade. Muscularis propria present and uninvolved.  Given that patient has had high frequency and recurrence despite induction BCG, patient was referred to hematology oncology to establish care for evaluation of intravesical gemcitabine weekly x6 to help to reduce the frequency and severity of the recurrence.   experienced mild hematuria after recent cystoscopy and procedure.  Symptoms are gettingbetter. Appetite is fair.  Lives with his son Mild chronic fatigue ,  INTERVAL HISTORY Michael Holloway is a 87 y.o. male who has above history reviewed by me today presents for follow up visit for evaluation prior to intravesical gemcitabine treatment. Problems and complaints are listed  below: Denies any nausea, vomiting, chest pain, shortness of breath, dysuria, fever, chills, Patient was recently seen by his cardiologist with his pacemaker interrogated. No new complaints. Denies abdominal pain, dysuria.      Review of Systems  Constitutional: Positive for fatigue. Negative for appetite change, chills, fever and unexpected weight change.  HENT:   Negative for hearing loss and voice change.   Eyes: Negative for eye problems and icterus.  Respiratory: Negative for chest tightness, cough and shortness of breath.   Cardiovascular: Negative for chest pain, leg swelling and palpitations.  Gastrointestinal: Negative for abdominal distention and abdominal pain.  Endocrine: Negative for hot flashes.  Genitourinary: Negative for difficulty urinating, dysuria and frequency.   Musculoskeletal: Negative for arthralgias.  Skin: Negative for itching and rash.  Neurological: Negative for numbness.       Intermittent dizziness episodes  Hematological: Negative for adenopathy. Does not bruise/bleed easily.  Psychiatric/Behavioral: Negative for confusion.    MEDICAL HISTORY:  Past Medical History:  Diagnosis Date  . Allergy   . Arthritis   . Bladder cancer ([place])   . Cancer ([place])    Basal Cell Skin Cancer  . Cataract   . Chronic kidney disease    Stage 3 CKD  . Dysrhythmia   . GERD (gastroesophageal reflux disease)   . Gout   . History of kidney stones   . Hypertension   . Hypotension 05/24/2020  . Neuropathy   . Presence of permanent cardiac pacemaker 2020  . Spinal stenosis     SURGICAL HISTORY: Past Surgical History:  Procedure Laterality Date  . BACK SURGERY    . CERVICAL SPINE SURGERY      X 2  . CHOLECYSTECTOMY    . CYSTOSCOPY W/ RETROGRADES Bilateral 08/25/2016  Procedure: CYSTOSCOPY WITH RETROGRADE PYELOGRAM;  Surgeon: Hollice Espy, MD;  Location: [facility];  Service: Urology;  Laterality: Bilateral;  . CYSTOSCOPY W/ RETROGRADES Bilateral 07/18/2019    Procedure: CYSTOSCOPY WITH RETROGRADE PYELOGRAM;  Surgeon: Hollice Espy, MD;  Location: [facility];  Service: Urology;  Laterality: Bilateral;  . CYSTOSCOPY W/ RETROGRADES Bilateral 03/26/2020   Procedure: CYSTOSCOPY WITH RETROGRADE PYELOGRAM;  Surgeon: Hollice Espy, MD;  Location: [facility];  Service: Urology;  Laterality: Bilateral;  . EYE SURGERY Bilateral    Cataract Extraction with IOL  . JOINT REPLACEMENT Bilateral    total knee replacements  . LOOP RECORDER INSERTION N/A 06/03/2017   Procedure: LOOP RECORDER INSERTION;  Surgeon: Isaias Cowman, MD;  Location: [facility];  Service: Cardiovascular;  Laterality: N/A;  . LUMBAR LAMINECTOMY      X 4  . PACEMAKER INSERTION Left 08/03/2018   Procedure: INSERTION PACEMAKER DUALCHAMBER;  Surgeon: Isaias Cowman, MD;  Location: [facility];  Service: Cardiovascular;  Laterality: Left;  . PROSTATE SURGERY  1990s   Dr. Eliberto Ivory ,in office procedure  . TONSILLECTOMY    . TOTAL KNEE ARTHROPLASTY Right 09/29/2019   Procedure: TOTAL KNEE ARTHROPLASTY;  Surgeon: Corky Mull, MD;  Location: [facility];  Service: Orthopedics;  Laterality: Right;  . TRANSURETHRAL RESECTION OF BLADDER TUMOR WITH MITOMYCIN-C N/A 08/25/2016   Procedure: TRANSURETHRAL RESECTION OF BLADDER TUMOR WITH MITOMYCIN-C;  Surgeon: Hollice Espy, MD;  Location: [facility];  Service: Urology;  Laterality: N/A;  . TRANSURETHRAL RESECTION OF BLADDER TUMOR WITH MITOMYCIN-C N/A 07/18/2019   Procedure: TRANSURETHRAL RESECTION OF BLADDER TUMOR WITH gemcitabine;  Surgeon: Hollice Espy, MD;  Location: [facility];  Service: Urology;  Laterality: N/A;  . TRANSURETHRAL RESECTION OF BLADDER TUMOR WITH MITOMYCIN-C N/A 03/26/2020   Procedure: TRANSURETHRAL RESECTION OF BLADDER TUMOR WITH gemcitabine;  Surgeon: Hollice Espy, MD;  Location: [facility];  Service: Urology;  Laterality: N/A;  . TRANSURETHRAL RESECTION OF PROSTATE N/A 06/14/2018   Procedure: TRANSURETHRAL RESECTION OF THE  PROSTATE (TURP) with Gemcitabine;  Surgeon: Hollice Espy, MD;  Location: [facility];  Service: Urology;  Laterality: N/A;    SOCIAL HISTORY: Social History   Socioeconomic History  . Marital status: Widowed    Spouse name: Not on file  . Number of children: Not on file  . Years of education: Not on file  . Highest education level: Not on file  Occupational History  . Occupation: Programmer, applications  Tobacco Use  . Smoking status: Former Smoker    Packs/day: 0.50    Years: 30.00    Pack years: 15.00    Types: Cigarettes    Quit date: 08/11/1978    Years since quitting: 41.8  . Smokeless tobacco: Never Used  Vaping Use  . Vaping Use: Never used  Substance and Sexual Activity  . Alcohol use: Not Currently    Comment: 1 BEER DAILY  . Drug use: No  . Sexual activity: Not Currently  Other Topics Concern  . Not on file  Social History Narrative   Patient lives with his son, Muhamad.  He feels safe in his home.   Very active. Goes to the [employer] regularly, works on his computer and putters around his home.   Independant   Social Determinants of Health   Financial Resource Strain: Not on file  Food Insecurity: Not on file  Transportation Needs: Not on file  Physical Activity: Not on file  Stress: Not on file  Social Connections: Not on file  Intimate Partner Violence:  Not on file    FAMILY HISTORY: Family History  Problem Relation Age of Onset  . Cancer Mother   . Stroke Father   . Bladder Cancer Neg Hx   . Kidney cancer Neg Hx   . Prostate cancer Neg Hx     ALLERGIES:  is allergic to sulfa antibiotics.  MEDICATIONS:  Current Outpatient Medications  Medication Sig Dispense Refill  . allopurinol (ZYLOPRIM) 100 MG tablet Take 100 mg by mouth daily.     Marland Kitchen aspirin EC 81 MG tablet Take 81 mg by mouth daily.    . Biotin 10 MG CAPS Take 10 mg by mouth daily.     . cetirizine (ZYRTEC) 10 MG tablet Take 10 mg by mouth every morning.     Mariane Baumgarten Calcium  (STOOL SOFTENER PO) Take 1 tablet by mouth daily.     Marland Kitchen gabapentin (NEURONTIN) 600 MG tablet Take 600 mg by mouth See admin instructions. Take 600 mg by mouth in the morning and afternoon and 900 mg at bedtime    . omeprazole (PRILOSEC) 20 MG capsule Take 20 mg by mouth every morning.     . traMADol (ULTRAM) 50 MG tablet Take 50 mg by mouth 3 (three) times daily as needed for moderate pain.     . Xylitol (XYLIMELTS MT) Use as directed 2 tablets in the mouth or throat at bedtime.    . Sodium Chloride-Xylitol (XLEAR SINUS CARE SPRAY NA) Place 1 spray into the nose daily. (Patient not taking: No sig reported)     Current Facility-Administered Medications  Medication Dose Route Frequency Provider Last Rate Last Admin  . gemcitabine (GEMZAR) 2,000 mg in sodium chloride irrigation 0.9 % chemo infusion  2,000 mg Irrigation Once Hollice Espy, MD      . gemcitabine [facility]) chemo syringe for bladder instillation 2,000 mg  2,000 mg Bladder Instillation Once Hollice Espy, MD      . gemcitabine [facility]) chemo syringe for bladder instillation 2,000 mg  2,000 mg Bladder Instillation Once Hollice Espy, MD      . lidocaine (XYLOCAINE) 2 % (with pres) injection 1,000 mg  50 mL Other Once Hollice Espy, MD      . lidocaine (XYLOCAINE) 2 % jelly 1 application  1 application Urethral Once Hollice Espy, MD       Facility-Administered Medications Ordered in Other Visits  Medication Dose Route Frequency Provider Last Rate Last Admin  . 0.9 %  sodium chloride infusion   Intravenous Once Earlie Server, MD         PHYSICAL EXAMINATION: ECOG PERFORMANCE STATUS: 1 - Symptomatic but completely ambulatory Vitals:   06/14/20 0938  BP: 115/74  Pulse: 91  Resp: 18  Temp: (!) 97.5 F (36.4 C)   Filed Weights   06/14/20 0938  Weight: 205 lb 8 oz (93.2 kg)    Physical Exam Constitutional:      General: He is not in acute distress.    Comments: Patient walks with a cane.  HENT:     Head: Normocephalic  and atraumatic.  Eyes:     General: No scleral icterus. Cardiovascular:     Rate and Rhythm: Normal rate and regular rhythm.     Heart sounds: Normal heart sounds.  Pulmonary:     Effort: Pulmonary effort is normal. No respiratory distress.     Breath sounds: No wheezing.  Abdominal:     General: Bowel sounds are normal. There is no distension.     Palpations: Abdomen is  soft.  Musculoskeletal:        General: No deformity. Normal range of motion.     Cervical back: Normal range of motion and neck supple.  Skin:    General: Skin is warm and dry.     Findings: No erythema or rash.  Neurological:     Mental Status: He is alert and oriented to person, place, and time. Mental status is at baseline.     Cranial Nerves: No cranial nerve deficit.     Coordination: Coordination normal.  Psychiatric:        Mood and Affect: Mood normal.     LABORATORY DATA:  I have reviewed the data as listed Lab Results  Component Value Date   WBC 5.5 06/14/2020   HGB 13.4 06/14/2020   HCT 40.2 06/14/2020   MCV 89.1 06/14/2020   PLT 227 06/14/2020   Recent Labs    09/22/19 1038 09/30/19 0353 10/01/19 0416 03/16/20 1129 05/31/20 0855 06/07/20 0905 06/14/20 0843  NA 141 141 139   < > 138 139 141  K 4.5 4.5 3.9   < > 4.1 4.5 4.6  CL 107 108 107   < > 103 105 106  CO2 25 26 25    < > 24 26 26   GLUCOSE 113* 107* 92   < > 88 110* 121*  BUN 29* 24* 26*   < > 23 28* 24*  CREATININE 1.81* 1.66* 1.51*   < > 1.76* 1.63* 1.81*  CALCIUM 9.1 8.4* 8.4*   < > 8.8* 8.7* 8.7*  GFRNONAA 33* 36* 41*   < > 37* 40* 35*  GFRAA 38* 42* 47*  --   --   --   --   PROT 7.0  --   --    < > 7.3 7.1 7.1  ALBUMIN 3.9  --   --    < > 3.9 3.8 3.8  AST 19  --   --    < > 20 19 21   ALT 13  --   --    < > 12 12 11   ALKPHOS 106  --   --    < > 107 101 94  BILITOT 0.8  --   --    < > 0.6 0.5 0.5   < > = values in this interval not displayed.   Iron/TIBC/Ferritin/ %Sat No results found for: IRON, TIBC, FERRITIN,  IRONPCTSAT    RADIOGRAPHIC STUDIES: I have personally reviewed the radiological images as listed and agreed with the findings in the report. 07/24/2016  CT abdomen pelvis wo contrast Numerous bilateral cystic renal lesions, some with calcifications.These cannot be fully characterized without intravenous contrast.Punctate bilateral nephrolithiasis. Irregular bladder wall thickening with possible focal bladder wall mass posteriorly on the left. Cannot exclude bladder cancer. Recommend further evaluation with direct visualization given the patient's gross hematuria. 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 Diffuse aortic atherosclerosis. Cardiomegaly, coronary artery disease.   ASSESSMENT & PLAN:  1. Malignant neoplasm of overlapping sites of bladder ([place])   2. Encounter for antineoplastic chemotherapy   3. Stage 3b chronic kidney disease (H[facility])    Noninvasive bladder cancer, multiple recurrence Labs are reviewed and discussed with patient. Proceed with intravesical gemcitabine today, this would be his last plan intravesical treatment. Patient will follow up with urology for further management and surveillance.  Intermittent dizziness episodes, probably due to dehydration episodes.  Encourage hydration. #UA showed moderate leukocyte, negative nitrate,  Patient is asymptomatic.  #CKD, stage III, creatinine fluctuates.  Encourage oral hydration. Recommend patient to avoid nephrotoxins.    questions were answered. The patient knows to call the clinic with any problems questions or concerns. No need to follow-up at this point.  I am happy to see him in the future if needed. CC Dr. Erlene Quan  Follow-up in 1 week Earlie Server, MD, PhD Hematology Oncology [facility] at [facility] Pager- [phone] 06/14/2020

## 2020-06-14 NOTE — Progress Notes (Signed)
Patient denies new problems/concerns today.   °

## 2020-06-14 NOTE — Progress Notes (Signed)
Per Dr. Tasia Catchings labs (including UA) have bee reviewed and okay to proceed with treatment as scheduled.    1102: Catheter clamped and Gemzar instilled.   1156: pt called nurse into room, and reported that the catheter was leaking.  Catheter appears to have leaked around insertion, catheter unclamped, and area cleaned.  1208: Foley removed and patient washed groin and upper thighs, pt educated to cleanse area again once at home, and to call clinic with any concerns, pt verbalizes understanding.  Pt tolerated procedure well, no s/s of distress noted, no redness noted to perineal area. Pt stable at discharge.

## 2020-07-10 ENCOUNTER — Encounter: Payer: Self-pay | Admitting: Urology

## 2020-07-10 ENCOUNTER — Ambulatory Visit (INDEPENDENT_AMBULATORY_CARE_PROVIDER_SITE_OTHER): Payer: Medicare Other | Admitting: Urology

## 2020-07-10 ENCOUNTER — Other Ambulatory Visit: Payer: Self-pay

## 2020-07-10 VITALS — BP 86/45

## 2020-07-10 DIAGNOSIS — C671 Malignant neoplasm of dome of bladder: Secondary | ICD-10-CM | POA: Diagnosis not present

## 2020-07-10 DIAGNOSIS — R351 Nocturia: Secondary | ICD-10-CM

## 2020-07-10 LAB — MICROSCOPIC EXAMINATION
Bacteria, UA: NONE SEEN
Crystal Type: NONE SEEN
Crystals: NONE SEEN
Mucus, UA: NONE SEEN
Renal Epithel, UA: NONE SEEN /hpf
Trichomonas, UA: NONE SEEN
Yeast, UA: NONE SEEN

## 2020-07-10 LAB — URINALYSIS, COMPLETE
Bilirubin, UA: NEGATIVE
Glucose, UA: NEGATIVE
Ketones, UA: NEGATIVE
Nitrite, UA: NEGATIVE
RBC, UA: NEGATIVE
Specific Gravity, UA: 1.015 (ref 1.005–1.030)
Urobilinogen, Ur: 0.2 mg/dL (ref 0.2–1.0)
pH, UA: 6 (ref 5.0–7.5)

## 2020-07-10 NOTE — Progress Notes (Signed)
07/10/20  CC:  Chief Complaint  Patient presents with  . Cysto    HPI: 87 y.o. male with recurrent urothelial carcinoma who returns today to discuss postop results as well as management of frequent recurrences.  In 03/2017, he was noted to have small recurrences most consistent with low-grade noninvasive lesions. He is now undergone 3 office procedures for an office fulguration for low-grade superficial appearing tumors including on 03/31/2017, 05/2016, and 11/2017.   Most recent upper tract imaging in the form of CT abdomen and pelvis without contrast on 3/18 which was unremarkable. Bilateral retrograde pyelogram in the operating room of 08/2016 also negative.  He returned to the operating room on 06/2018 for repeat TURBT for small tumors involving the prostatic urethra and bladder neck, small. Tumors were primarily low-grade with some high-grade features, superficial. Muscularis propria was present but not involved. Intravesical gemcitabine was instilled.  He additional low-grade appearing recurrence at the dome and underwent office fulguration of these 01/2019  Patient underwent BCG and full inductio course in 05/2019.   He underwent a small TURBT on 07/18/2019.Carpeting of low-grade appearing papillary tumor at dome of bladder, just to the right of previously fulgurated area in the office, measuring approximately 1 cm. There is a few small satellite lesions as well ranging from 1 to 2 mm. Unremarkable retrograde pyelogram.  He had another recurrence and return to the operating room on 03/26/2020.  Papillary carpeting involving the right lateral bladder wall extending towards bladder neck, at least 2.5 cm x 2 cm area in diameter.  There is also a small area of carpeting at the dome, less than 1 cm.  Bilateral retrograde unremarkable.  Chemotherapy was instilled postoperatively, gemcitabine.  Surgical pathology consistent with recurrent low-grade noninvasive disease.  He is now  status post induction course of intravesical gemcitabine, received 5/6 doses (1 canceled in the setting of hypotension).  His only urinary complaint today is nocturia which is worsening.  He has no daytime symptoms.  He does go to bed early at 930 doesn't have supper until about 730 and drinks all the way up until bedtime.  He does smoking history, 3/4 ppd x 20 years. Quit in 1980.     Blood pressure (!) 86/45. NED. A&Ox3.   No respiratory distress   Abd soft, NT, ND Normal phallus with bilateral descended testicles   Cystoscopy Procedure Note  Patient identification was confirmed, informed consent was obtained, and patient was prepped using Betadine solution.  Lidocaine jelly was administered per urethral meatus.     Pre-Procedure: - Inspection reveals a normal caliber ureteral meatus.  Procedure: The flexible cystoscope was introduced without difficulty - No urethral strictures/lesions are present. - Enlarged prostate with irregular fossa - Normal bladder neck - Bilateral ureteral orifices identified - Bladder mucosa  reveals no ulcers or lesions, mild subtle erythema which is nonspecific - Multiple stellate scars  - Area of shaggy necrosis at right dome, location of previous TURBT resection bed - No bladder stones - Mild trabeculation  Retroflexion shows small intravesical median lobe.    Post-Procedure: - Patient tolerated the procedure well  Assessment/ Plan:  1. Malignant neoplasm of dome of urinary bladder ([facility]CC) No evidence of disease today  He does have several findings including some shaggy necrosis which remains adherent from his previous TURBT, will continue to follow this area closely  He has had some mild subtle erythema which I suspect is related to recent BCG induction course, inflammatory in appearance  We'll continue to  follow, no history of CIS  - Urinalysis, Complete  2. Nocturia No significant daytime symptoms, primarily at  nighttime  We discussed behavioral modification, is prone to dehydration this would like him to drink more in the morning and midday cut back prior to bed, will continue to monitor symptoms    Cystoscopy in 3 months  Hollice Espy, MD

## 2020-07-10 NOTE — Progress Notes (Deleted)
07/10/2020 10:21 AM   Michael Holloway February 10, 1929 [id number]  Referring provider: Kirk Ruths, MD [facility] [place],  [place] [address]  Chief Complaint  Patient presents with  . Cysto    HPI: 87 y.o. male with recurrent urothelial carcinoma who returns today to discuss postop results as well as management of frequent recurrences.  In 03/2017, he was noted to have small recurrences most consistent with low-grade noninvasive lesions. He is now undergone 3 office procedures for an office fulguration for low-grade superficial appearing tumors including on 03/31/2017, 05/2016, and 11/2017.   Most recent upper tract imaging in the form of CT abdomen and pelvis without contrast on 3/18 which was unremarkable. Bilateral retrograde pyelogram in the operating room of 08/2016 also negative.  He returned to the operating room on 06/2018 for repeat TURBT for small tumors involving the prostatic urethra and bladder neck, small. Tumors were primarily low-grade with some high-grade features, superficial. Muscularis propria was present but not involved. Intravesical gemcitabine was instilled.  He additional low-grade appearing recurrence at the dome and underwent office fulguration of these 01/2019  Patient underwent BCG and full inductio course in 05/2019.   He underwent a small TURBT on 07/18/2019.Carpeting of low-grade appearing papillary tumor at dome of bladder, just to the right of previously fulgurated area in the office, measuring approximately 1 cm. There is a few small satellite lesions as well ranging from 1 to 2 mm. Unremarkable retrograde pyelogram.  He had another recurrence and return to the operating room on 03/26/2020.  Papillary carpeting involving the right lateral bladder wall extending towards bladder neck, at least 2.5 cm x 2 cm area in diameter.  There is also a small area of carpeting at the dome, less than 1 cm.  Bilateral  retrograde unremarkable.  Chemotherapy was instilled postoperatively, gemcitabine.  Surgical pathology consistent with recurrent low-grade noninvasive disease.  He does smoking history, 3/4 ppd x 20 years. Quit in 1980.    No postoperative issues.  No dysuria or gross hematuria.  PMH: Past Medical History:  Diagnosis Date  . Allergy   . Arthritis   . Bladder cancer ([place])   . Cancer ([place])    Basal Cell Skin Cancer  . Cataract   . Chronic kidney disease    Stage 3 CKD  . Dysrhythmia   . GERD (gastroesophageal reflux disease)   . Gout   . History of kidney stones   . Hypertension   . Hypotension 05/24/2020  . Neuropathy   . Presence of permanent cardiac pacemaker 2020  . Spinal stenosis     Surgical History: Past Surgical History:  Procedure Laterality Date  . BACK SURGERY    . CERVICAL SPINE SURGERY      X 2  . CHOLECYSTECTOMY    . CYSTOSCOPY W/ RETROGRADES Bilateral 08/25/2016   Procedure: CYSTOSCOPY WITH RETROGRADE PYELOGRAM;  Surgeon: Hollice Espy, MD;  Location: [facility];  Service: Urology;  Laterality: Bilateral;  . CYSTOSCOPY W/ RETROGRADES Bilateral 07/18/2019   Procedure: CYSTOSCOPY WITH RETROGRADE PYELOGRAM;  Surgeon: Hollice Espy, MD;  Location: [facility];  Service: Urology;  Laterality: Bilateral;  . CYSTOSCOPY W/ RETROGRADES Bilateral 03/26/2020   Procedure: CYSTOSCOPY WITH RETROGRADE PYELOGRAM;  Surgeon: Hollice Espy, MD;  Location: [facility];  Service: Urology;  Laterality: Bilateral;  . EYE SURGERY Bilateral    Cataract Extraction with IOL  . JOINT REPLACEMENT Bilateral    total knee replacements  . LOOP RECORDER INSERTION N/A  06/03/2017   Procedure: LOOP RECORDER INSERTION;  Surgeon: Isaias Cowman, MD;  Location: [facility];  Service: Cardiovascular;  Laterality: N/A;  . LUMBAR LAMINECTOMY      X 4  . PACEMAKER INSERTION Left 08/03/2018   Procedure: INSERTION PACEMAKER DUALCHAMBER;  Surgeon: Isaias Cowman, MD;  Location: [facility]  [facility];  Service: Cardiovascular;  Laterality: Left;  . PROSTATE SURGERY  1990s   Dr. Eliberto Ivory ,in office procedure  . TONSILLECTOMY    . TOTAL KNEE ARTHROPLASTY Right 09/29/2019   Procedure: TOTAL KNEE ARTHROPLASTY;  Surgeon: Corky Mull, MD;  Location: [facility];  Service: Orthopedics;  Laterality: Right;  . TRANSURETHRAL RESECTION OF BLADDER TUMOR WITH MITOMYCIN-C N/A 08/25/2016   Procedure: TRANSURETHRAL RESECTION OF BLADDER TUMOR WITH MITOMYCIN-C;  Surgeon: Hollice Espy, MD;  Location: [facility];  Service: Urology;  Laterality: N/A;  . TRANSURETHRAL RESECTION OF BLADDER TUMOR WITH MITOMYCIN-C N/A 07/18/2019   Procedure: TRANSURETHRAL RESECTION OF BLADDER TUMOR WITH gemcitabine;  Surgeon: Hollice Espy, MD;  Location: [facility];  Service: Urology;  Laterality: N/A;  . TRANSURETHRAL RESECTION OF BLADDER TUMOR WITH MITOMYCIN-C N/A 03/26/2020   Procedure: TRANSURETHRAL RESECTION OF BLADDER TUMOR WITH gemcitabine;  Surgeon: Hollice Espy, MD;  Location: [facility];  Service: Urology;  Laterality: N/A;  . TRANSURETHRAL RESECTION OF PROSTATE N/A 06/14/2018   Procedure: TRANSURETHRAL RESECTION OF THE PROSTATE (TURP) with Gemcitabine;  Surgeon: Hollice Espy, MD;  Location: [facility];  Service: Urology;  Laterality: N/A;    Home Medications:  Allergies as of 07/10/2020      Reactions   Sulfa Antibiotics Rash      Medication List       Accurate as of July 10, 2020 10:21 AM. If you have any questions, ask your nurse or doctor.        allopurinol 100 MG tablet Commonly known as: ZYLOPRIM Take 100 mg by mouth daily.   aspirin EC 81 MG tablet Take 81 mg by mouth daily.   Biotin 10 MG Caps Take 10 mg by mouth daily.   cetirizine 10 MG tablet Commonly known as: ZYRTEC Take 10 mg by mouth every morning.   gabapentin 600 MG tablet Commonly known as: NEURONTIN Take 600 mg by mouth See admin instructions. Take 600 mg by mouth in the morning and afternoon and 900 mg at bedtime   omeprazole 20 MG  capsule Commonly known as: PRILOSEC Take 20 mg by mouth every morning.   STOOL SOFTENER PO Take 1 tablet by mouth daily.   traMADol 50 MG tablet Commonly known as: ULTRAM Take 50 mg by mouth 3 (three) times daily as needed for moderate pain.   XLEAR SINUS CARE SPRAY NA Place 1 spray into the nose daily.   XYLIMELTS MT Use as directed 2 tablets in the mouth or throat at bedtime.       Allergies:  Allergies  Allergen Reactions  . Sulfa Antibiotics Rash    Family History: Family History  Problem Relation Age of Onset  . Cancer Mother   . Stroke Father   . Bladder Cancer Neg Hx   . Kidney cancer Neg Hx   . Prostate cancer Neg Hx     Social History:  reports that he quit smoking about 41 years ago. His smoking use included cigarettes. He has a 15.00 pack-year smoking history. He has never used smokeless tobacco. He reports previous alcohol use. He reports that he does not use drugs.   Physical Exam: BP (!) 86/45   Constitutional:  Alert and oriented, No acute distress. HEENT: [place] AT, moist mucus membranes.  Trachea midline, no masses. Cardiovascular: No clubbing, cyanosis, or edema. Respiratory: Normal respiratory effort, no increased work of breathing. Skin: No rashes, bruises or suspicious lesions. Neurologic: Grossly intact, no focal deficits, moving all 4 extremities. Psychiatric: Normal mood and affect.    Assessment & Plan:    1. 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 - Ambulatory referral to [place], MD  [facility] [address] [place], [place] [address] [phone]

## 2020-07-19 ENCOUNTER — Ambulatory Visit: Payer: Self-pay | Admitting: Urology

## 2020-10-09 ENCOUNTER — Other Ambulatory Visit: Payer: Self-pay

## 2020-10-09 ENCOUNTER — Ambulatory Visit (INDEPENDENT_AMBULATORY_CARE_PROVIDER_SITE_OTHER): Payer: Medicare Other | Admitting: Urology

## 2020-10-09 ENCOUNTER — Encounter: Payer: Self-pay | Admitting: Urology

## 2020-10-09 VITALS — BP 120/71 | HR 87 | Ht 75.0 in | Wt 205.0 lb

## 2020-10-09 DIAGNOSIS — C671 Malignant neoplasm of dome of bladder: Secondary | ICD-10-CM

## 2020-10-09 NOTE — Progress Notes (Signed)
   10/09/20  CC:  Chief Complaint  Patient presents with  . Cysto    HPI: 87 y.o. male with recurrent urothelial carcinoma who returns today to discuss postop results as well as management of frequent recurrences.  In 03/2017, he was noted to have small recurrences most consistent with low-grade noninvasive lesions. He is now undergone 3 office procedures for an office fulguration for low-grade superficial appearing tumors including on 03/31/2017, 05/2016, and 11/2017.   Most recent upper tract imaging in the form of CT abdomen and pelvis without contrast on 3/18 which was unremarkable. Bilateral retrograde pyelogram in the operating room of 08/2016 also negative.  He returned to the operating room on 06/2018 for repeat TURBT for small tumors involving the prostatic urethra and bladder neck, small. Tumors were primarily low-grade with some high-grade features, superficial. Muscularis propria was present but not involved. Intravesical gemcitabine was instilled.  He additional low-grade appearing recurrence at the dome and underwent office fulguration of these 01/2019  Patient underwent BCG and full inductio course in 05/2019.   He underwent a small TURBT on 07/18/2019.Carpeting of low-grade appearing papillary tumor at dome of bladder, just to the right of previously fulgurated area in the office, measuring approximately 1 cm. There is a few small satellite lesions as well ranging from 1 to 2 mm. Unremarkable retrograde pyelogram.  He had another recurrence and return to the operating room on 03/26/2020.  Papillary carpeting involving the right lateral bladder wall extending towards bladder neck, at least 2.5 cm x 2 cm area in diameter.  There is also a small area of carpeting at the dome, less than 1 cm.  Bilateral retrograde unremarkable.  Chemotherapy was instilled postoperatively, gemcitabine.  Surgical pathology consistent with recurrent low-grade noninvasive disease.  He is now  status post induction course of intravesical gemcitabine, received 5/6 doses (1 canceled in the setting of hypotension).  He does smoking history, 3/4 ppd x 20 years. Quit in 1980.    He is turning 89 in 2 months and excited about it.  His blood pressure is improved today.  No urinary issues.   Blood pressure (!) 86/45. NED. A&Ox3.   No respiratory distress   Abd soft, NT, ND Normal phallus with bilateral descended testicles   Cystoscopy Procedure Note  Patient identification was confirmed, informed consent was obtained, and patient was prepped using Betadine solution.  Lidocaine jelly was administered per urethral meatus.     Pre-Procedure: - Inspection reveals a normal caliber ureteral meatus.  Procedure: The flexible cystoscope was introduced without difficulty - No urethral strictures/lesions are present. - Enlarged prostate with irregular fossa - Normal bladder neck - Bilateral ureteral orifices identified - Bladder mucosa  reveals no ulcers or lesions - Multiple stellate scars  - Area of shaggy necrosis at right dome, location of previous TURBT resection bed - No bladder stones - Mild trabeculation  Retroflexion shows small intravesical median lobe.    Post-Procedure: - Patient tolerated the procedure well  Assessment/ Plan:  1. 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

## 2020-10-10 LAB — URINALYSIS, COMPLETE
Bilirubin, UA: NEGATIVE
Ketones, UA: NEGATIVE
Leukocytes,UA: NEGATIVE
Nitrite, UA: NEGATIVE
RBC, UA: NEGATIVE
Specific Gravity, UA: 1.015 (ref 1.005–1.030)
Urobilinogen, Ur: 0.2 mg/dL (ref 0.2–1.0)
pH, UA: 6 (ref 5.0–7.5)

## 2020-10-10 LAB — MICROSCOPIC EXAMINATION: Bacteria, UA: NONE SEEN

## 2021-01-08 NOTE — Progress Notes (Signed)
   01/09/2021   CC:  Chief Complaint  Patient presents with   Cysto    HPI: Michael Holloway is a 87 y.o. male who has recurrent urothelial carcinoma who returns today for cystoscopy.   In 03/2017, he was noted to have small recurrences mostly conistent with low-grade noninvasive lesions.  He is now undergone 3 office procedures for an office fulguration for low-grade superficial appearing tumors including on 03/31/2017, 05/2016, and 11/2017.    Additionally he had low-grade appearing recurrence at the dome and underwent office fulguration of these 01/2019.   In 06/2018 he returned to the OR for repeat TURBT for small tumors involving the prostatic urethra and bladder neck, small.  Tumors were primarily low-grade with some high-grade features, superficial.  Muscularis propria was present but not involved.  Intravesical gemcitabine was instilled.  He underwent BCG and full induction course in 05/2019.   He underwent another TURBT on 07/18/2019.  Carpeting of low-grade appearing papillary tumor at dome of bladder, just to the right of previously fulgurated area in the office, measuring approximately 1 cm.  There is a few small satellite lesions as well ranging from 1 to 2 mm.  Unremarkable retrograde pyelogram.   He had another recurrence and returned to OR 03/26/2020.   Papillary carpeting involving the right lateral bladder wall extending towards bladder neck, at least 2.5 cm x 2 cm area in diameter.  There is also a small area of carpeting at the dome, less than 1 cm.  Bilateral retrograde unremarkable.  Chemotherapy was instilled postoperatively, gemcitabine.  Surgical pathology consistent with recurrent low-grade noninvasive disease.   He is now s/p induction course of intravesical gemcitabine.   He is doing well today and recently celebrated his 89th birthday. He states he has been drinking more water.    Vitals:   01/09/21 1012  BP: (!) 112/56  Pulse: 76  NED. A&Ox3.   No respiratory  distress   Abd soft, NT, ND Normal phallus with bilateral descended testicles  Cystoscopy Procedure Note  Patient identification was confirmed, informed consent was obtained, and patient was prepped using Betadine solution.  Lidocaine jelly was administered per urethral meatus.     Pre-Procedure: - Inspection reveals a normal caliber ureteral meatus.  Procedure: The flexible cystoscope was introduced without difficulty - No urethral strictures/lesions are present. 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  I have reviewed the above documentation for accuracy and completeness, and I agree with the above.   [name] MD

## 2021-01-09 ENCOUNTER — Other Ambulatory Visit: Payer: Self-pay

## 2021-01-09 ENCOUNTER — Ambulatory Visit (INDEPENDENT_AMBULATORY_CARE_PROVIDER_SITE_OTHER): Payer: Medicare Other | Admitting: Urology

## 2021-01-09 VITALS — BP 112/56 | HR 76 | Ht 76.0 in | Wt 205.0 lb

## 2021-01-09 DIAGNOSIS — C671 Malignant neoplasm of dome of bladder: Secondary | ICD-10-CM | POA: Diagnosis not present

## 2021-01-10 ENCOUNTER — Other Ambulatory Visit (INDEPENDENT_AMBULATORY_CARE_PROVIDER_SITE_OTHER): Payer: Self-pay | Admitting: Nurse Practitioner

## 2021-01-10 DIAGNOSIS — I6523 Occlusion and stenosis of bilateral carotid arteries: Secondary | ICD-10-CM

## 2021-01-11 ENCOUNTER — Ambulatory Visit (INDEPENDENT_AMBULATORY_CARE_PROVIDER_SITE_OTHER): Payer: Medicare Other | Admitting: Vascular Surgery

## 2021-01-11 ENCOUNTER — Ambulatory Visit (INDEPENDENT_AMBULATORY_CARE_PROVIDER_SITE_OTHER): Payer: Medicare Other

## 2021-01-11 ENCOUNTER — Other Ambulatory Visit: Payer: Self-pay

## 2021-01-11 DIAGNOSIS — I6523 Occlusion and stenosis of bilateral carotid arteries: Secondary | ICD-10-CM

## 2021-01-11 LAB — URINALYSIS, COMPLETE
Bilirubin, UA: NEGATIVE
Glucose, UA: NEGATIVE
Ketones, UA: NEGATIVE
Leukocytes,UA: NEGATIVE
Nitrite, UA: NEGATIVE
RBC, UA: NEGATIVE
Specific Gravity, UA: 1.015 (ref 1.005–1.030)
Urobilinogen, Ur: 0.2 mg/dL (ref 0.2–1.0)
pH, UA: 6 (ref 5.0–7.5)

## 2021-01-11 LAB — MICROSCOPIC EXAMINATION
Bacteria, UA: NONE SEEN
Epithelial Cells (non renal): NONE SEEN /hpf (ref 0–10)

## 2021-01-23 ENCOUNTER — Encounter (INDEPENDENT_AMBULATORY_CARE_PROVIDER_SITE_OTHER): Payer: Self-pay | Admitting: *Deleted

## 2021-04-14 NOTE — Progress Notes (Signed)
   04/16/2021 CC:  Chief Complaint  Patient presents with   Cysto     HPI: Michael Holloway is a 87 y.o. male with a personal history of recurrent urothelial carcinoma, who returns today for surveillance cystoscopy.   In 03/2017, he was noted to have small recurrences mostly conistent with low-grade noninvasive lesions.  He is now undergone 3 office procedures for an office fulguration for low-grade superficial appearing tumors including on 03/31/2017, 05/2016, and 11/2017.     Additionally he had low-grade appearing recurrence at the dome and underwent office fulguration of these 01/2019.    In 06/2018 he returned to the OR for repeat TURBT for small tumors involving the prostatic urethra and bladder neck, small.  Tumors were primarily low-grade with some high-grade features, superficial.  Muscularis propria was present but not involved.  Intravesical gemcitabine was instilled.   He underwent BCG and full induction course in 05/2019.   He underwent another TURBT on 07/18/2019.  Carpeting of low-grade appearing papillary tumor at dome of bladder, just to the right of previously fulgurated area in the office, measuring approximately 1 cm.  There is a few small satellite lesions as well ranging from 1 to 2 mm. Unremarkable retrograde pyelogram.    He had another recurrence and returned to OR 03/26/2020.   Papillary carpeting involving the right lateral bladder wall extending towards bladder neck, at least 2.5 cm x 2 cm area in diameter.  There is also a small area of carpeting at the dome, less than 1 cm.  Bilateral retrograde unremarkable. Chemotherapy was instilled postoperatively, gemcitabine.  Surgical pathology consistent with recurrent low-grade noninvasive disease.   He is now s/p induction course of intravesical gemcitabine.   He was not able to void today and his bladder is completely empty. He was given a single dose of Keflex.   NED. A&Ox3.   No respiratory distress   Abd soft, NT,  ND Normal phallus with bilateral descended testicles  Cystoscopy Procedure Note  Patient identification was confirmed, informed consent was obtained, and patient was prepped using Betadine solution.  Lidocaine jelly was administered per urethral meatus.     Pre-Procedure: - Inspection reveals a normal caliber ureteral meatus.  Procedure: The flexible cystoscope was introduced without difficulty - No urethral strictures/lesions are present. 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  I have reviewed the above documentation for accuracy and completeness, and I agree with the above.   Hollice Espy, MD

## 2021-04-16 ENCOUNTER — Ambulatory Visit (INDEPENDENT_AMBULATORY_CARE_PROVIDER_SITE_OTHER): Payer: Medicare Other | Admitting: Urology

## 2021-04-16 ENCOUNTER — Other Ambulatory Visit: Payer: Self-pay

## 2021-04-16 VITALS — BP 102/58 | HR 81 | Ht 76.0 in | Wt 200.0 lb

## 2021-04-16 DIAGNOSIS — Z8551 Personal history of malignant neoplasm of bladder: Secondary | ICD-10-CM

## 2021-04-16 MED ORDER — CEPHALEXIN 250 MG PO CAPS
500.0000 mg | ORAL_CAPSULE | Freq: Once | ORAL | Status: AC
  Administered 2021-04-16: 500 mg via ORAL

## 2021-04-17 DIAGNOSIS — I480 Paroxysmal atrial fibrillation: Secondary | ICD-10-CM | POA: Insufficient documentation

## 2021-04-23 ENCOUNTER — Other Ambulatory Visit: Payer: Self-pay | Admitting: Infectious Diseases

## 2021-04-23 DIAGNOSIS — R42 Dizziness and giddiness: Secondary | ICD-10-CM

## 2021-04-23 DIAGNOSIS — I10 Essential (primary) hypertension: Secondary | ICD-10-CM

## 2021-04-23 DIAGNOSIS — Z950 Presence of cardiac pacemaker: Secondary | ICD-10-CM

## 2021-04-23 DIAGNOSIS — R55 Syncope and collapse: Secondary | ICD-10-CM

## 2021-05-16 ENCOUNTER — Other Ambulatory Visit: Payer: Self-pay

## 2021-05-16 ENCOUNTER — Ambulatory Visit
Admission: RE | Admit: 2021-05-16 | Discharge: 2021-05-16 | Disposition: A | Discharge status: Home / Self Care | Payer: Medicare Other | Source: Ambulatory Visit | Attending: Infectious Diseases | Admitting: Infectious Diseases

## 2021-05-16 DIAGNOSIS — I10 Essential (primary) hypertension: Secondary | ICD-10-CM | POA: Insufficient documentation

## 2021-05-16 DIAGNOSIS — R55 Syncope and collapse: Secondary | ICD-10-CM | POA: Insufficient documentation

## 2021-05-16 DIAGNOSIS — Z950 Presence of cardiac pacemaker: Secondary | ICD-10-CM | POA: Diagnosis present

## 2021-05-16 DIAGNOSIS — R42 Dizziness and giddiness: Secondary | ICD-10-CM | POA: Diagnosis not present

## 2021-07-15 NOTE — Progress Notes (Incomplete)
? ?  07/16/2021 ?CC: No chief complaint on file. ? ? ? ?HPI: ?Michael Holloway is a 88 y.o. male with a personal history recurrent urothelial carcinoma, who returns today for surveillance cystoscopy.  ? ?In 03/2017, he was noted to have small recurrences mostly conistent with low-grade noninvasive lesions.  He is now undergone 3 office procedures for an office fulguration for low-grade superficial appearing tumors including on 03/31/2017, 05/2016, and 11/2017.   ?  ?Additionally he had low-grade appearing recurrence at the dome and underwent office fulguration of these 01/2019.  ?  ?In 06/2018 he returned to the OR for repeat TURBT for small tumors involving the prostatic urethra and bladder neck, small.  Tumors were primarily low-grade with some high-grade features, superficial.  Muscularis propria was present but not involved.  Intravesical gemcitabine was instilled. ?  ?He underwent BCG and full induction course in 05/2019.  ?  ?He underwent another TURBT on 07/18/2019.  Carpeting of low-grade appearing papillary tumor at dome of bladder, just to the right of previously fulgurated area in the office, measuring approximately 1 cm.  There is a few small satellite lesions as well ranging from 1 to 2 mm. Unremarkable retrograde pyelogram.  ?  ?He had another recurrence and returned to OR 03/26/2020. Bilateral retrograde unremarkable. Chemotherapy was instilled postoperatively, gemcitabine.  Surgical pathology consistent with recurrent low-grade noninvasive disease. ?  ?He is now s/p induction course of intravesical gemcitabine.  ? ? ?There were no vitals filed for this visit. ?NED. A&Ox3.   ?No respiratory distress   ?Abd soft, NT, ND ?Normal phallus with bilateral descended testicles ? ?Cystoscopy Procedure Note ? ?Patient identification was confirmed, informed consent was obtained, and patient was prepped using Betadine solution.  Lidocaine jelly was administered per urethral meatus.   ? ? ?Pre-Procedure: ?- Inspection  reveals a normal caliber ureteral meatus. ? ?Procedure: ?The flexible cystoscope was introduced without difficulty ?- No urethral strictures/lesions are present. ?- {Blank multiple:19197::"Enlarged","Surgically absent","Normal"} prostate *** ?- {Blank multiple:19197::"Normal","Elevated","Tight"} bladder neck ?- Bilateral ureteral orifices identified ?- Bladder mucosa  reveals no ulcers, tumors, or lesions ?- No bladder stones ?- No trabeculation ? ?Retroflexion shows *** ? ? ?Post-Procedure: ?- Patient tolerated the procedure well ? ?Assessment/ Plan: ? ? ?No follow-ups on file. ? ?I,Kailey Littlejohn,acting as a scribe for Hollice Espy, MD.,have documented all relevant documentation on the behalf of Hollice Espy, MD,as directed by  Hollice Espy, MD while in the presence of Hollice Espy, MD. ? ?

## 2021-07-16 ENCOUNTER — Other Ambulatory Visit: Payer: Medicare Other | Admitting: Urology

## 2021-08-13 NOTE — Progress Notes (Signed)
? ?  08/14/21 ? ?CC:  ?Chief Complaint  ?Patient presents with  ? Cysto  ? ? ?HPI: ?Michael Holloway is a 88 y.o. male with a personal history of recurrent urothelial carcinoma, who returns today for surveillance cystoscopy. ? ?In 03/2017, he was noted to have small recurrences mostly conistent with low-grade noninvasive lesions.  He is now undergone 3 office procedures for an office fulguration for low-grade superficial appearing tumors including on 03/31/2017, 05/2016, and 11/2017.   ?  ?Additionally he had low-grade appearing recurrence at the dome and underwent office fulguration of these 01/2019.  ?  ?In 06/2018 he returned to the OR for repeat TURBT for small tumors involving the prostatic urethra and bladder neck, small.  Tumors were primarily low-grade with some high-grade features, superficial.  Muscularis propria was present but not involved.  Intravesical gemcitabine was instilled. ?  ?He underwent BCG and full induction course in 05/2019.  ?  ?He underwent another TURBT on 07/18/2019.  Carpeting of low-grade appearing papillary tumor at dome of bladder, just to the right of previously fulgurated area in the office, measuring approximately 1 cm.  There is a few small satellite lesions as well ranging from 1 to 2 mm. Unremarkable retrograde pyelogram.  ?  ?He had another recurrence and returned to OR 03/26/2020.   Papillary carpeting involving the right lateral bladder wall extending towards bladder neck, at least 2.5 cm x 2 cm area in diameter. There is also a small area of carpeting at the dome, less than 1 cm.  Bilateral retrograde unremarkable. Chemotherapy was instilled postoperatively, gemcitabine.  Surgical pathology consistent with recurrent low-grade noninvasive disease. ?  ?He is now s/p induction course of intravesical gemcitabine.  ? ?Vitals:  ? 08/14/21 1331  ?BP: (!) 92/57  ?Pulse: 88  ? ?NED. A&Ox3.   ?No respiratory distress   ?Abd soft, NT, ND ?Normal phallus with bilateral descended  testicles ? ?Cystoscopy Procedure Note ? ?Patient identification was confirmed, informed consent was obtained, and patient was prepped using Betadine solution.  Lidocaine jelly was administered per urethral meatus.   ? ? ?Pre-Procedure: ?- Inspection reveals a normal caliber ureteral meatus. ? ?Procedure: ?The flexible cystoscope was introduced without difficulty ?- No urethral strictures/lesions are present. ?- Enlarged prostate slightly irregular fossa ?- Normal bladder neck ?- Bilateral ureteral orifices identified ?- Bladder mucosa  reveals no ulcers, tumors, or lesions ?- Multiple stellate scars  ?- No bladder stones ?-Moderate trabeculation particularly at the dome ? ?Retroflexion shows small intravesical median lobe  ? ? ?Post-Procedure: ?- Patient tolerated the procedure well ? ? ?Assessment/ Plan: ? 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  ? ?Return in about 4 months (around 12/14/2021). ? ?Conley Rolls as a scribe for Hollice Espy, MD.,have documented all relevant documentation on the behalf of Hollice Espy, MD,as directed by  Hollice Espy, MD while in the presence of Hollice Espy, MD.\ ? ?I have reviewed the above documentation for accuracy and completeness, and I agree with the above.  ? ?Hollice Espy, MD ? ? ?

## 2021-08-14 ENCOUNTER — Encounter: Payer: Self-pay | Admitting: Urology

## 2021-08-14 ENCOUNTER — Ambulatory Visit (INDEPENDENT_AMBULATORY_CARE_PROVIDER_SITE_OTHER): Payer: Medicare Other | Admitting: Urology

## 2021-08-14 VITALS — BP 92/57 | HR 88 | Ht 76.0 in | Wt 200.0 lb

## 2021-08-14 DIAGNOSIS — R310 Gross hematuria: Secondary | ICD-10-CM

## 2021-08-14 LAB — URINALYSIS, COMPLETE
Bilirubin, UA: NEGATIVE
Glucose, UA: NEGATIVE
Ketones, UA: NEGATIVE
Nitrite, UA: NEGATIVE
Protein,UA: NEGATIVE
RBC, UA: NEGATIVE
Specific Gravity, UA: 1.01 (ref 1.005–1.030)
Urobilinogen, Ur: 0.2 mg/dL (ref 0.2–1.0)
pH, UA: 5.5 (ref 5.0–7.5)

## 2021-08-14 LAB — MICROSCOPIC EXAMINATION: Bacteria, UA: NONE SEEN

## 2021-10-26 ENCOUNTER — Other Ambulatory Visit: Payer: Self-pay | Admitting: Nurse Practitioner

## 2021-12-17 ENCOUNTER — Ambulatory Visit (INDEPENDENT_AMBULATORY_CARE_PROVIDER_SITE_OTHER): Payer: Medicare Other | Admitting: Urology

## 2021-12-17 VITALS — BP 114/71 | HR 80 | Ht 76.0 in | Wt 195.0 lb

## 2021-12-17 DIAGNOSIS — R310 Gross hematuria: Secondary | ICD-10-CM | POA: Diagnosis not present

## 2021-12-17 DIAGNOSIS — Z8551 Personal history of malignant neoplasm of bladder: Secondary | ICD-10-CM

## 2021-12-17 LAB — MICROSCOPIC EXAMINATION: Bacteria, UA: NONE SEEN

## 2021-12-17 LAB — URINALYSIS, COMPLETE
Bilirubin, UA: NEGATIVE
Glucose, UA: NEGATIVE
Ketones, UA: NEGATIVE
Leukocytes,UA: NEGATIVE
Nitrite, UA: NEGATIVE
RBC, UA: NEGATIVE
Specific Gravity, UA: 1.01 (ref 1.005–1.030)
Urobilinogen, Ur: 0.2 mg/dL (ref 0.2–1.0)
pH, UA: 5 (ref 5.0–7.5)

## 2021-12-17 NOTE — Progress Notes (Signed)
    12/17/21 CC:  Chief Complaint  Patient presents with   Cysto    HPI: Michael Holloway is a 88 y.o. male with a personal history of recurrent urothelial carcinoma, who returns today for surveillance cystoscopy.   In 03/2017, he was noted to have small recurrences mostly conistent with low-grade noninvasive lesions.  He is now undergone 3 office procedures for an office fulguration for low-grade superficial appearing tumors including on 03/31/2017, 05/2016, and 11/2017.     Additionally he had low-grade appearing recurrence at the dome and underwent office fulguration of these 01/2019.    In 06/2018 he returned to the OR for repeat TURBT for small tumors involving the prostatic urethra and bladder neck, small.  Tumors were primarily low-grade with some high-grade features, superficial.  Muscularis propria was present but not involved.  Intravesical gemcitabine was instilled.   He underwent BCG and full induction course in 05/2019.    He underwent another TURBT on 07/18/2019.  Carpeting of low-grade appearing papillary tumor at dome of bladder, just to the right of previously fulgurated area in the office, measuring approximately 1 cm.  There is a few small satellite lesions as well ranging from 1 to 2 mm. Unremarkable retrograde pyelogram.    He had another recurrence and returned to OR 03/26/2020.   Papillary carpeting involving the right lateral bladder wall extending towards bladder neck, at least 2.5 cm x 2 cm area in diameter. There is also a small area of carpeting at the dome, less than 1 cm.  Bilateral retrograde unremarkable. Chemotherapy was instilled postoperatively, gemcitabine.  Surgical pathology consistent with recurrent low-grade noninvasive disease.   He is now s/p induction course of intravesical gemcitabine.   Doing well today, no urinary complaints.     Vitals:   12/17/21 1146  BP: 114/71  Pulse: 80   NED. A&Ox3.   No respiratory distress   Abd soft, NT,  ND Normal phallus with bilateral descended testicles  Cystoscopy Procedure Note  Patient identification was confirmed, informed consent was obtained, and patient was prepped using Betadine solution.  Lidocaine jelly was administered per urethral meatus.     Pre-Procedure: - Inspection reveals a normal caliber ureteral meatus.  Procedure: The flexible cystoscope was introduced without difficulty - No urethral strictures/lesions are present. 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  I have reviewed the above documentation for accuracy and completeness, and I agree with the above.   Hollice Espy, MD

## 2022-01-17 ENCOUNTER — Encounter (INDEPENDENT_AMBULATORY_CARE_PROVIDER_SITE_OTHER): Payer: Medicare Other

## 2022-01-17 ENCOUNTER — Ambulatory Visit (INDEPENDENT_AMBULATORY_CARE_PROVIDER_SITE_OTHER): Payer: Medicare Other | Admitting: Vascular Surgery

## 2022-01-17 IMAGING — CT CT HEAD W/O CM
4 series · 17 of 47 positions shown, 19 images · non-contrast
Comparison: None.

CLINICAL DATA: Postural dizziness and presyncope.  Hypertension.

EXAM:
CT HEAD WITHOUT CONTRAST
TECHNIQUE: Contiguous axial images were obtained from the base of the skull
through the vertex without intravenous contrast.

[Series 2: head wo · axial · 0.42mm/px · z∈[+1548,+1663]mm · 7 of 31 slices shown, 9 images]
[im 4/31  brain]
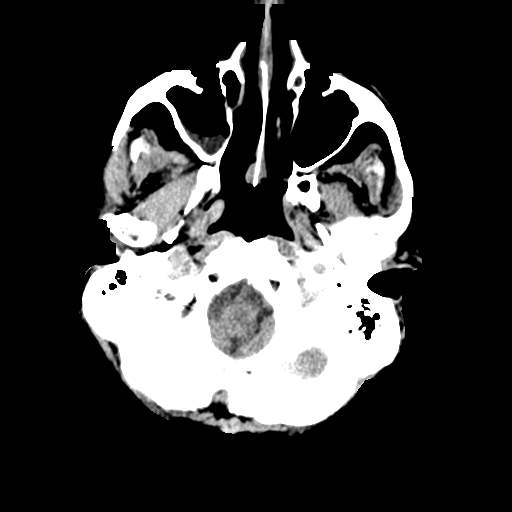
[im 4/31  bone]
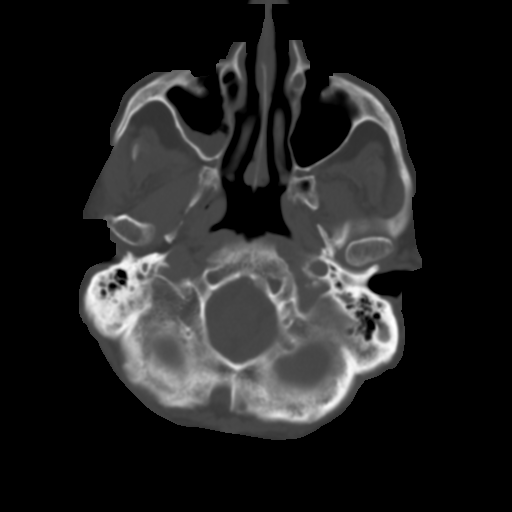
[im 8/31  brain]
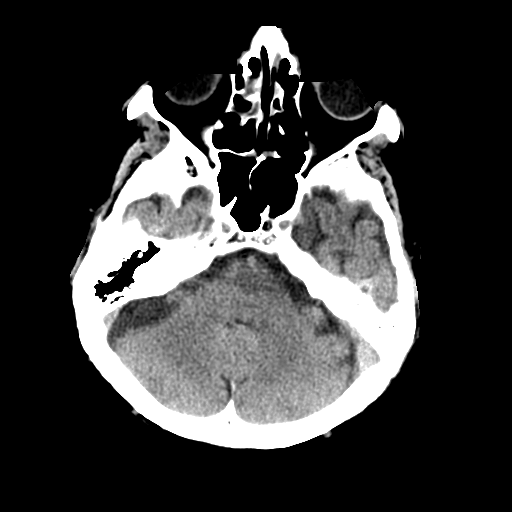
[im 12/31  brain]
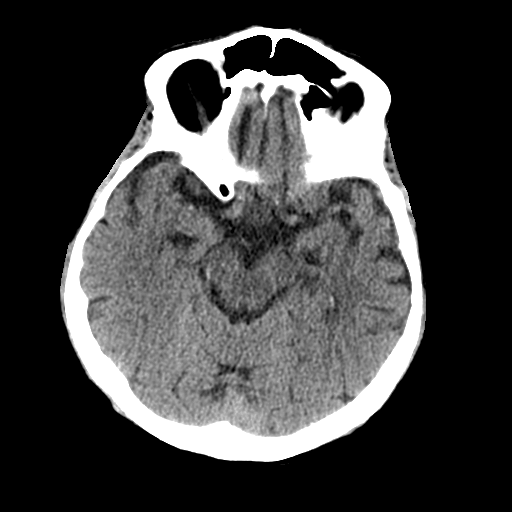
[im 16/31  brain]
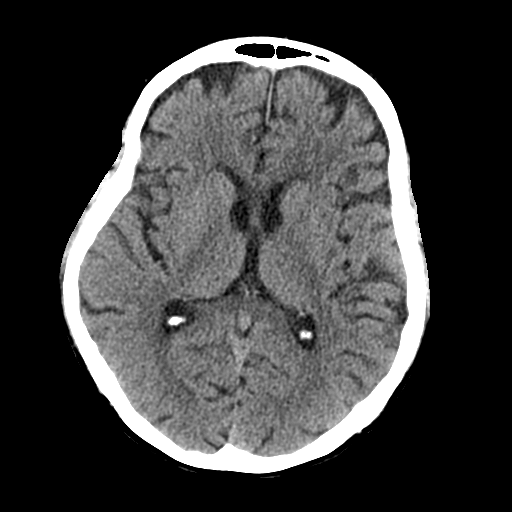
[im 19/31  brain]
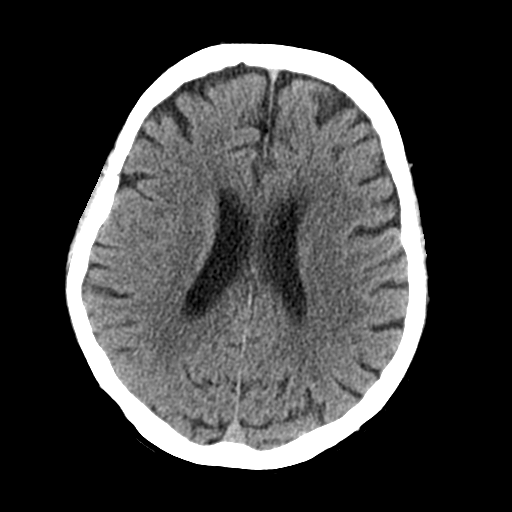
[im 19/31  bone]
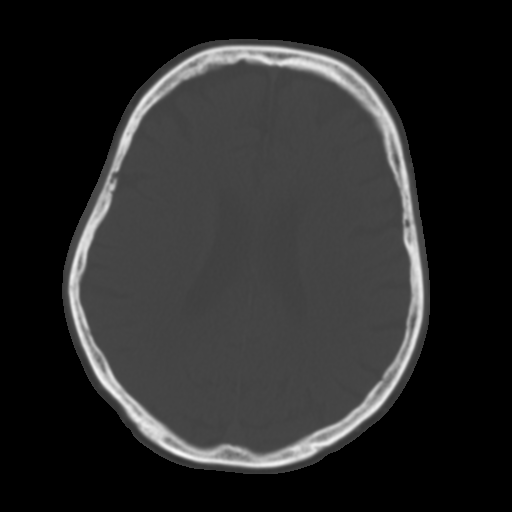
[im 23/31  brain]
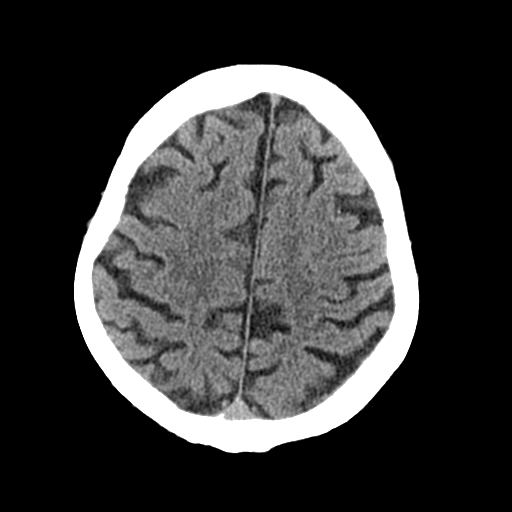
[im 27/31  brain]
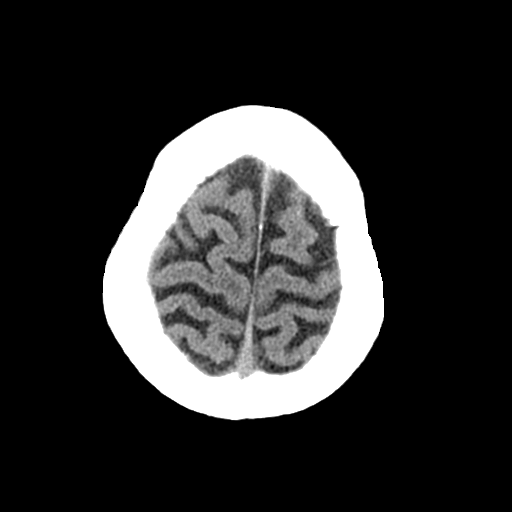

[Series 3: head bone · axial · 0.42mm/px · z∈[+1547,+1601]mm · 4 of 77 slices shown]
[im 8/77  bone]
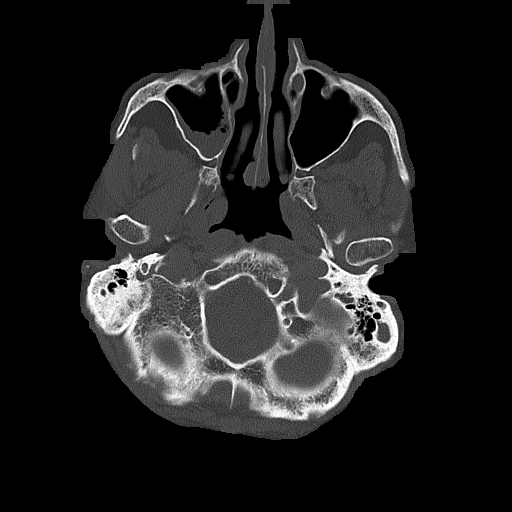
[im 16/77  bone]
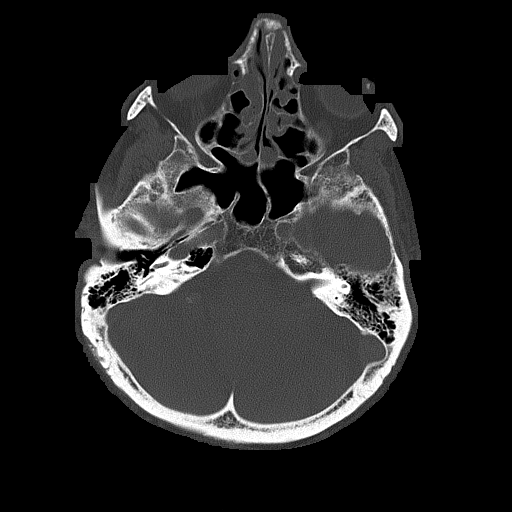
[im 23/77  bone]
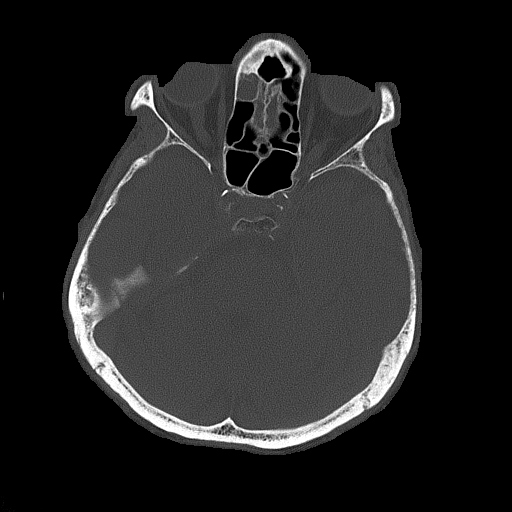
[im 35/77  bone]
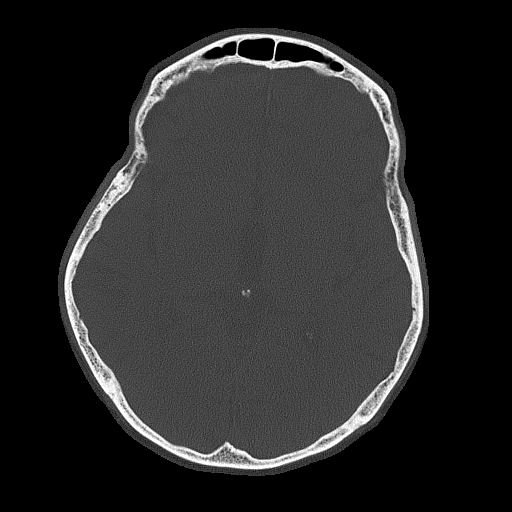

[Series 4: coronal soft tissue · coronal · 0.31mm/px · 3 of 65 slices shown]
[im 22/65  brain]
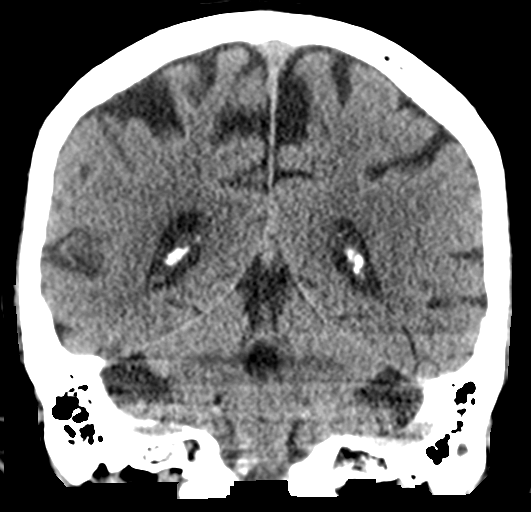
[im 29/65  brain]
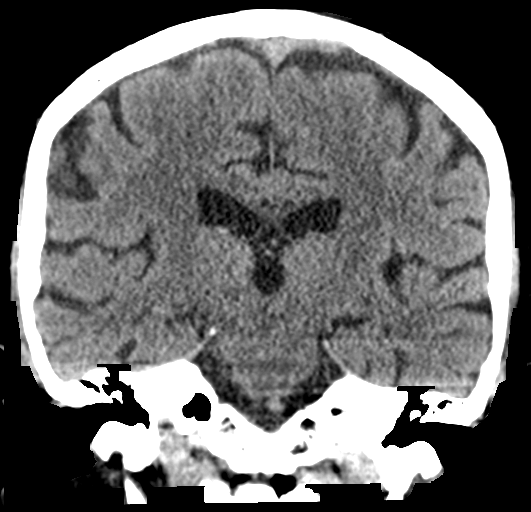
[im 36/65  brain]
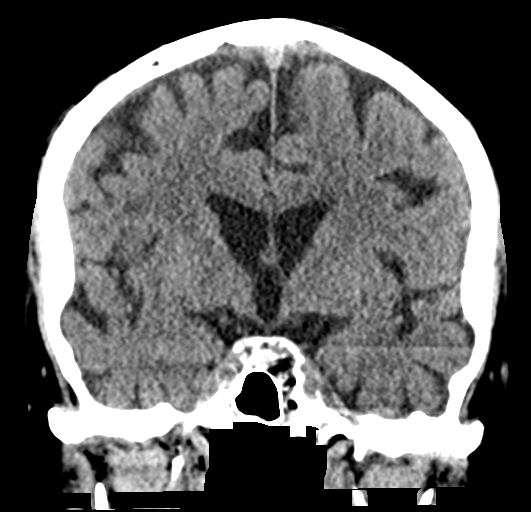

[Series 5: sagittal soft tissue · sagittal · 0.31mm/px · 3 of 55 slices shown]
[im 19/55  brain]
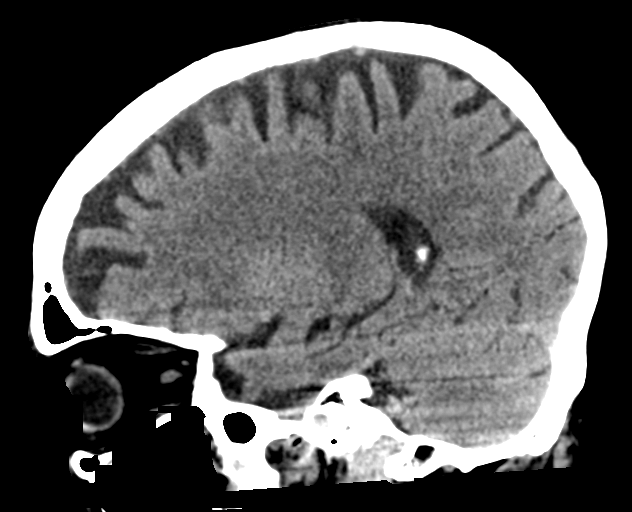
[im 28/55  brain]
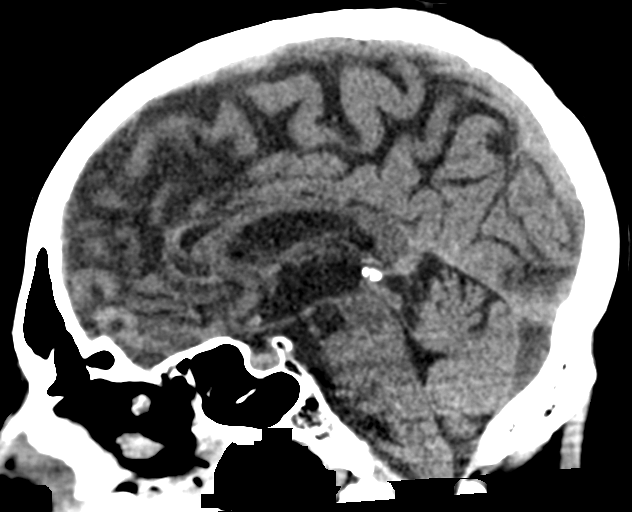
[im 37/55  brain]
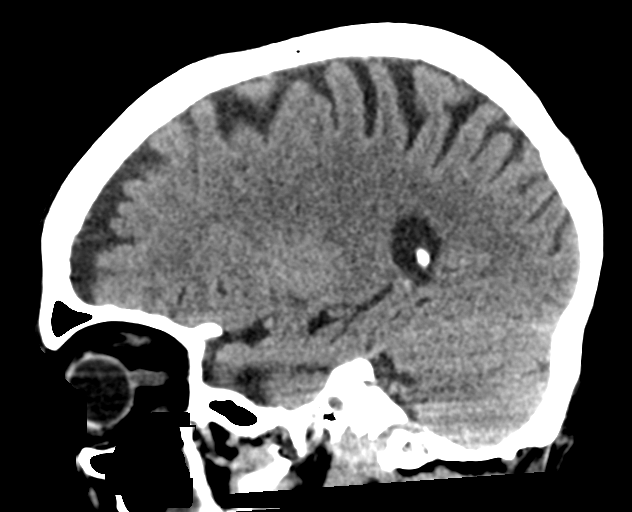

[17 of 47 positions shown; findings below may reference images not displayed]

FINDINGS: Brain: No evidence of acute infarction, hemorrhage, hydrocephalus,
extra-axial collection or mass lesion/mass effect. Mild white matter
hypodensity in the frontal lobes bilaterally.

Vascular: Negative for hyperdense vessel

Skull: No focal skull lesion. Degenerative changes C1-2 with
prominent pannus formation posterior to the dens. This is causing
mild spinal stenosis

Sinuses/Orbits: Mucosal edema paranasal sinuses. Air-fluid level
right maxillary sinus. No orbital lesion. Bilateral cataract
extraction

Other: None.  Incomplete evaluation.
IMPRESSION: No acute intracranial abnormality. Mild chronic microvascular
ischemic change in the white matter

Degenerative changes C1-2 with mild spinal stenosis. Incomplete
evaluation.

## 2022-02-27 ENCOUNTER — Other Ambulatory Visit (INDEPENDENT_AMBULATORY_CARE_PROVIDER_SITE_OTHER): Payer: Self-pay | Admitting: Nurse Practitioner

## 2022-02-27 DIAGNOSIS — I6523 Occlusion and stenosis of bilateral carotid arteries: Secondary | ICD-10-CM

## 2022-02-28 ENCOUNTER — Ambulatory Visit (INDEPENDENT_AMBULATORY_CARE_PROVIDER_SITE_OTHER): Payer: Medicare Other

## 2022-02-28 ENCOUNTER — Ambulatory Visit (INDEPENDENT_AMBULATORY_CARE_PROVIDER_SITE_OTHER): Payer: Medicare Other | Admitting: Vascular Surgery

## 2022-02-28 ENCOUNTER — Encounter (INDEPENDENT_AMBULATORY_CARE_PROVIDER_SITE_OTHER): Payer: Self-pay | Admitting: Vascular Surgery

## 2022-02-28 VITALS — BP 120/69 | HR 96 | Resp 16 | Wt 194.8 lb

## 2022-02-28 DIAGNOSIS — I6523 Occlusion and stenosis of bilateral carotid arteries: Secondary | ICD-10-CM

## 2022-02-28 DIAGNOSIS — I10 Essential (primary) hypertension: Secondary | ICD-10-CM

## 2022-02-28 NOTE — Assessment & Plan Note (Signed)
blood pressure control important in reducing the progression of atherosclerotic disease. On appropriate oral medications.  

## 2022-02-28 NOTE — Progress Notes (Signed)
MRN : [id number]  Michael Holloway is a 88 y.o. (1928/11/30) male who presents with chief complaint of  Chief Complaint  Patient presents with   Follow-up    Ultrasound follow up  .  History of Present Illness: Patient returns in follow-up of his carotid disease.  He is doing well today without specific complaints.  He denies any focal neurologic symptoms. Specifically, the patient denies amaurosis fugax, speech or swallowing difficulties, or arm or leg weakness or numbness.  Carotid duplex today reveals stable 1 to 39% ICA stenosis without progression from previous studies.  Current Outpatient Medications  Medication Sig Dispense Refill   cetirizine (ZYRTEC) 10 MG tablet Take 10 mg by mouth every morning.      gabapentin (NEURONTIN) 600 MG tablet Take 600 mg by mouth See admin instructions. Take 600 mg by mouth in the morning and afternoon and 900 mg at bedtime     omeprazole (PRILOSEC) 20 MG capsule Take 20 mg by mouth every morning.      Sodium Chloride-Xylitol (XLEAR SINUS CARE SPRAY NA) Place 1 spray into the nose daily.     traMADol (ULTRAM) 50 MG tablet Take 50 mg by mouth 3 (three) times daily as needed for moderate pain.      Current Facility-Administered Medications  Medication Dose Route Frequency Provider Last Rate Last Admin   gemcitabine (GEMZAR) 2,000 mg in sodium chloride irrigation 0.9 % chemo infusion  2,000 mg Irrigation Once Hollice Espy, MD       gemcitabine [facility]) chemo syringe for bladder instillation 2,000 mg  2,000 mg Bladder Instillation Once Hollice Espy, MD       gemcitabine [facility]) chemo syringe for bladder instillation 2,000 mg  2,000 mg Bladder Instillation Once Hollice Espy, MD       lidocaine (XYLOCAINE) 2 % (with pres) injection 1,000 mg  50 mL Other Once Hollice Espy, MD       lidocaine (XYLOCAINE) 2 % jelly 1 application  1 application  Urethral Once Hollice Espy, MD        Past Medical History:  Diagnosis Date   Allergy     Arthritis    Bladder cancer ([place])    Cancer ([place])    Basal Cell Skin Cancer   Cataract    Chronic kidney disease    Stage 3 CKD   Dysrhythmia    GERD (gastroesophageal reflux disease)    Gout    History of kidney stones    Hypertension    Hypotension 05/24/2020   Neuropathy    Presence of permanent cardiac pacemaker 2020   Spinal stenosis     Past Surgical History:  Procedure Laterality Date   BACK SURGERY     CERVICAL SPINE SURGERY      X 2   CHOLECYSTECTOMY     CYSTOSCOPY W/ RETROGRADES Bilateral 08/25/2016   Procedure: CYSTOSCOPY WITH RETROGRADE PYELOGRAM;  Surgeon: Hollice Espy, MD;  Location: [facility];  Service: Urology;  Laterality: Bilateral;   CYSTOSCOPY W/ RETROGRADES Bilateral 07/18/2019   Procedure: CYSTOSCOPY WITH RETROGRADE PYELOGRAM;  Surgeon: Hollice Espy, MD;  Location: [facility];  Service: Urology;  Laterality: Bilateral;   CYSTOSCOPY W/ RETROGRADES Bilateral 03/26/2020   Procedure: CYSTOSCOPY WITH RETROGRADE PYELOGRAM;  Surgeon: Hollice Espy, MD;  Location: [facility];  Service: Urology;  Laterality: Bilateral;   EYE SURGERY Bilateral    Cataract Extraction with IOL   JOINT REPLACEMENT Bilateral    total knee replacements   LOOP RECORDER INSERTION N/A 06/03/2017  Procedure: LOOP RECORDER INSERTION;  Surgeon: Isaias Cowman, MD;  Location: [facility];  Service: Cardiovascular;  Laterality: N/A;   LUMBAR LAMINECTOMY      X 4   PACEMAKER INSERTION Left 08/03/2018   Procedure: INSERTION PACEMAKER DUALCHAMBER;  Surgeon: Isaias Cowman, MD;  Location: [facility];  Service: Cardiovascular;  Laterality: Left;   PROSTATE SURGERY  1990s   Dr. Eliberto Ivory ,in office procedure   TONSILLECTOMY     TOTAL KNEE ARTHROPLASTY Right 09/29/2019   Procedure: TOTAL KNEE ARTHROPLASTY;  Surgeon: Corky Mull, MD;  Location: [facility];  Service: Orthopedics;  Laterality: Right;   TRANSURETHRAL RESECTION OF BLADDER TUMOR WITH MITOMYCIN-C N/A 08/25/2016   Procedure:  TRANSURETHRAL RESECTION OF BLADDER TUMOR WITH MITOMYCIN-C;  Surgeon: Hollice Espy, MD;  Location: [facility];  Service: Urology;  Laterality: N/A;   TRANSURETHRAL RESECTION OF BLADDER TUMOR WITH MITOMYCIN-C N/A 07/18/2019   Procedure: TRANSURETHRAL RESECTION OF BLADDER TUMOR WITH gemcitabine;  Surgeon: Hollice Espy, MD;  Location: [facility];  Service: Urology;  Laterality: N/A;   TRANSURETHRAL RESECTION OF BLADDER TUMOR WITH MITOMYCIN-C N/A 03/26/2020   Procedure: TRANSURETHRAL RESECTION OF BLADDER TUMOR WITH gemcitabine;  Surgeon: Hollice Espy, MD;  Location: [facility];  Service: Urology;  Laterality: N/A;   TRANSURETHRAL RESECTION OF PROSTATE N/A 06/14/2018   Procedure: TRANSURETHRAL RESECTION OF THE PROSTATE (TURP) with Gemcitabine;  Surgeon: Hollice Espy, MD;  Location: [facility];  Service: Urology;  Laterality: N/A;     Social History   Tobacco Use   Smoking status: Former    Packs/day: 0.50    Years: 30.00    Total pack years: 15.00    Types: Cigarettes    Quit date: 08/11/1978    Years since quitting: 43.5   Smokeless tobacco: Never  Vaping Use   Vaping Use: Never used  Substance Use Topics   Alcohol use: Not Currently    Comment: 1 BEER DAILY   Drug use: No      Family History  Problem Relation Age of Onset   Cancer Mother    Stroke Father    Bladder Cancer Neg Hx    Kidney cancer Neg Hx    Prostate cancer Neg Hx      Allergies  Allergen Reactions   Sulfa Antibiotics Rash     REVIEW OF SYSTEMS (Negative unless checked)  Constitutional: '[]'$ Weight loss  '[]'$ Fever  '[]'$ Chills Cardiac: '[]'$ Chest pain   '[]'$ Chest pressure   '[]'$ Palpitations   '[]'$ Shortness of breath when laying flat   '[]'$ Shortness of breath at rest   '[]'$ Shortness of breath with exertion. Vascular:  '[]'$ Pain in legs with walking   '[]'$ Pain in legs at rest   '[]'$ Pain in legs when laying flat   '[]'$ Claudication   '[]'$ Pain in feet when walking  '[]'$ Pain in feet at rest  '[]'$ Pain in feet when laying flat   '[]'$ History of DVT    '[]'$ Phlebitis   '[]'$ Swelling in legs   '[]'$ Varicose veins   '[]'$ Non-healing ulcers Pulmonary:   '[]'$ Uses home oxygen   '[]'$ Productive cough   '[]'$ Hemoptysis   '[]'$ Wheeze  '[]'$ COPD   '[]'$ Asthma Neurologic:  '[]'$ Dizziness  '[]'$ Blackouts   '[]'$ Seizures   '[]'$ History of stroke   '[]'$ History of TIA  '[]'$ Aphasia   '[]'$ Temporary blindness   '[]'$ Dysphagia   '[]'$ Weakness or numbness in arms   '[]'$ Weakness or numbness in legs Musculoskeletal:  '[x]'$ Arthritis   '[]'$ Joint swelling   '[x]'$ Joint pain   '[]'$ Low back pain Hematologic:  '[]'$ Easy bruising  '[]'$ Easy bleeding   '[]'$ Hypercoagulable state   '[]'$   Anemic  '[]'$ Hepatitis Gastrointestinal:  '[]'$ Blood in stool   '[]'$ Vomiting blood  '[x]'$ Gastroesophageal reflux/heartburn   '[]'$ Difficulty swallowing. Genitourinary:  '[x]'$ Chronic kidney disease   '[]'$ Difficult urination  '[]'$ Frequent urination  '[]'$ Burning with urination   '[]'$ Blood in urine Skin:  '[]'$ Rashes   '[]'$ Ulcers   '[]'$ Wounds Psychological:  '[]'$ History of anxiety   '[]'$  History of major depression.  Physical Examination  Vitals:   02/28/22 1052  BP: 120/69  Pulse: 96  Resp: 16  Weight: 194 lb 12.8 oz (88.4 kg)   Body mass index is 23.71 kg/m. Gen:  WD/WN, NAD. Appears younger than stated age. Head: [place]/AT, No temporalis wasting. Ear/Nose/Throat: Hearing grossly intact, nares w/o erythema or drainage, trachea midline Eyes: Conjunctiva clear. Sclera non-icteric Neck: Supple.  No bruit  Pulmonary:  Good air movement, equal and clear to auscultation bilaterally.  Cardiac: irregular Vascular:  Vessel Right Left  Radial Palpable Palpable           Musculoskeletal: M/S 5/5 throughout.  No deformity or atrophy. Walks with a cane. No edema. Neurologic: CN 2-12 intact. Sensation grossly intact in extremities.  Symmetrical.  Speech is fluent. Motor exam as listed above. Psychiatric: Judgment intact, Mood & affect appropriate for pt's clinical situation. Dermatologic: No rashes or ulcers noted.  No cellulitis or open wounds.     CBC Lab Results  Component Value Date   WBC  5.5 06/14/2020   HGB 13.4 06/14/2020   HCT 40.2 06/14/2020   MCV 89.1 06/14/2020   PLT 227 06/14/2020    BMET    Component Value Date/Time   NA 141 06/14/2020 0843   K 4.6 06/14/2020 0843   CL 106 06/14/2020 0843   CO2 26 06/14/2020 0843   GLUCOSE 121 (H) 06/14/2020 0843   BUN 24 (H) 06/14/2020 0843   CREATININE 1.81 (H) 06/14/2020 0843   CALCIUM 8.7 (L) 06/14/2020 0843   GFRNONAA 35 (L) 06/14/2020 0843   GFRAA 47 (L) 10/01/2019 0416   CrCl cannot be calculated (Patient's most recent lab result is older than the maximum 21 days allowed.).  COAG Lab Results  Component Value Date   INR 1.0 07/20/2018    Radiology No results found.   Assessment/Plan Carotid stenosis Carotid duplex today reveals stable 1 to 39% ICA stenosis bilaterally without progression from previous studies.  Recheck in 1 year.  No role for intervention.  Continue current medical regimen.  Benign essential hypertension blood pressure control important in reducing the progression of atherosclerotic disease. On appropriate oral medications.    Leotis Pain, MD  02/28/2022 11:25 AM    This note was created with Dragon medical transcription system.  Any errors from dictation are purely unintentional

## 2022-02-28 NOTE — Assessment & Plan Note (Signed)
Carotid duplex today reveals stable 1 to 39% ICA stenosis bilaterally without progression from previous studies.  Recheck in 1 year.  No role for intervention.  Continue current medical regimen.

## 2022-03-10 ENCOUNTER — Encounter (INDEPENDENT_AMBULATORY_CARE_PROVIDER_SITE_OTHER): Payer: Self-pay

## 2022-04-23 ENCOUNTER — Encounter: Payer: Self-pay | Admitting: Urology

## 2022-04-23 ENCOUNTER — Ambulatory Visit (INDEPENDENT_AMBULATORY_CARE_PROVIDER_SITE_OTHER): Payer: Medicare Other | Admitting: Urology

## 2022-04-23 VITALS — BP 127/83 | HR 96 | Ht 76.0 in | Wt 194.0 lb

## 2022-04-23 DIAGNOSIS — Z8551 Personal history of malignant neoplasm of bladder: Secondary | ICD-10-CM

## 2022-04-23 DIAGNOSIS — R310 Gross hematuria: Secondary | ICD-10-CM | POA: Diagnosis not present

## 2022-04-23 LAB — MICROSCOPIC EXAMINATION

## 2022-04-23 LAB — URINALYSIS, COMPLETE
Bilirubin, UA: NEGATIVE
Glucose, UA: NEGATIVE
Ketones, UA: NEGATIVE
Leukocytes,UA: NEGATIVE
Nitrite, UA: NEGATIVE
Protein,UA: NEGATIVE
RBC, UA: NEGATIVE
Specific Gravity, UA: <1.005 — ABNORMAL LOW (ref 1.005–1.030)
Urobilinogen, Ur: 0.2 mg/dL (ref 0.2–1.0)
pH, UA: 6 (ref 5.0–7.5)

## 2022-04-24 NOTE — Progress Notes (Signed)
    04/23/22  CC:  Chief Complaint  Patient presents with   Cysto    HPI: Michael Holloway is a 88 y.o. male with a personal history of recurrent urothelial carcinoma, who returns today for surveillance cystoscopy.   In 03/2017, he was noted to have small recurrences mostly conistent with low-grade noninvasive lesions.  He is now undergone 3 office procedures for an office fulguration for low-grade superficial appearing tumors including on 03/31/2017, 05/2016, and 11/2017.     Additionally he had low-grade appearing recurrence at the dome and underwent office fulguration of these 01/2019.    In 06/2018 he returned to the OR for repeat TURBT for small tumors involving the prostatic urethra and bladder neck, small.  Tumors were primarily low-grade with some high-grade features, superficial.  Muscularis propria was present but not involved.  Intravesical gemcitabine was instilled.   He underwent BCG and full induction course in 05/2019.    He underwent another TURBT on 07/18/2019.  Carpeting of low-grade appearing papillary tumor at dome of bladder, just to the right of previously fulgurated area in the office, measuring approximately 1 cm.  There is a few small satellite lesions as well ranging from 1 to 2 mm. Unremarkable retrograde pyelogram.    He had another recurrence and returned to OR 03/26/2020.   Papillary carpeting involving the right lateral bladder wall extending towards bladder neck, at least 2.5 cm x 2 cm area in diameter. There is also a small area of carpeting at the dome, less than 1 cm.  Bilateral retrograde unremarkable. Chemotherapy was instilled postoperatively, gemcitabine.  Surgical pathology consistent with recurrent low-grade noninvasive disease.   He is now s/p induction course of intravesical gemcitabine.   Doing well today, no urinary complaints.     Vitals:   04/23/22 1057  BP: 127/83  Pulse: 96   NED. A&Ox3.   No respiratory distress   Abd soft, NT,  ND Normal phallus with bilateral descended testicles  Cystoscopy Procedure Note  Patient identification was confirmed, informed consent was obtained, and patient was prepped using Betadine solution.  Lidocaine jelly was administered per urethral meatus.     Pre-Procedure: - Inspection reveals a normal caliber ureteral meatus.  Procedure: The flexible cystoscope was introduced without difficulty - No urethral strictures/lesions are present. 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

## 2022-10-21 ENCOUNTER — Ambulatory Visit: Payer: Medicare Other | Admitting: Urology

## 2022-10-21 VITALS — BP 124/78 | HR 99 | Ht 76.0 in | Wt 194.0 lb

## 2022-10-21 DIAGNOSIS — Z8551 Personal history of malignant neoplasm of bladder: Secondary | ICD-10-CM | POA: Diagnosis not present

## 2022-10-21 DIAGNOSIS — N3289 Other specified disorders of bladder: Secondary | ICD-10-CM | POA: Diagnosis not present

## 2022-10-21 LAB — URINALYSIS, COMPLETE
Bilirubin, UA: NEGATIVE
Glucose, UA: NEGATIVE
Leukocytes,UA: NEGATIVE
Nitrite, UA: NEGATIVE
RBC, UA: NEGATIVE
Specific Gravity, UA: 1.015 (ref 1.005–1.030)
Urobilinogen, Ur: 0.2 mg/dL (ref 0.2–1.0)
pH, UA: 5.5 (ref 5.0–7.5)

## 2022-10-21 LAB — MICROSCOPIC EXAMINATION

## 2022-10-21 NOTE — Progress Notes (Signed)
    04/23/22  CC:  Chief Complaint  Patient presents with   Cysto    HPI: Michael Holloway is a 89 y.o. male with a personal history of recurrent urothelial carcinoma, who returns today for surveillance cystoscopy.   In 03/2017, he was noted to have small recurrences mostly conistent with low-grade noninvasive lesions.  He is now undergone 3 office procedures for an office fulguration for low-grade superficial appearing tumors including on 03/31/2017, 05/2016, and 11/2017.     Additionally he had low-grade appearing recurrence at the dome and underwent office fulguration of these 01/2019.    In 06/2018 he returned to the OR for repeat TURBT for small tumors involving the prostatic urethra and bladder neck, small.  Tumors were primarily low-grade with some high-grade features, superficial.  Muscularis propria was present but not involved.  Intravesical gemcitabine was instilled.   He underwent BCG and full induction course in 05/2019.    He underwent another TURBT on 07/18/2019.  Carpeting of low-grade appearing papillary tumor at dome of bladder, just to the right of previously fulgurated area in the office, measuring approximately 1 cm.  There is a few small satellite lesions as well ranging from 1 to 2 mm. Unremarkable retrograde pyelogram.    He had another recurrence and returned to OR 03/26/2020.   Papillary carpeting involving the right lateral bladder wall extending towards bladder neck, at least 2.5 cm x 2 cm area in diameter. There is also a small area of carpeting at the dome, less than 1 cm.  Bilateral retrograde unremarkable. Chemotherapy was instilled postoperatively, gemcitabine.  Surgical pathology consistent with recurrent low-grade noninvasive disease.   He is now s/p induction course of intravesical gemcitabine.   Doing well today, no urinary complaints.     Vitals:   10/21/22 1014  BP: 124/78  Pulse: 99   NED. A&Ox3.   No respiratory distress   Abd soft, NT,  ND Normal phallus with bilateral descended testicles  Cystoscopy Procedure Note  Patient identification was confirmed, informed consent was obtained, and patient was prepped using Betadine solution.  Lidocaine jelly was administered per urethral meatus.     Pre-Procedure: - Inspection reveals a normal caliber ureteral meatus.  Procedure: The flexible cystoscope was introduced without difficulty - No urethral strictures/lesions are present. - Enlarged prostate slightly irregular fossa  - Normal bladder neck - Bilateral ureteral orifices identified - Bladder mucosa  reveals no ulcers, tumors, or lesions - Multiple stellate scars  - No bladder stones -Moderate trabeculation particularly at the dome where there are multiple hypervascular areas likely from scarring, however, one slightly more 5 mm erythematous patch just at the dome.    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 Early recurrence versus vascular lesion from scarring -Plan to reassess this area on closer follow-up, will plan for cystoscopy in 3 months with an 6 months -No obvious papillary tumor which is reassuring  F/u 3 mo cysto    Vanna Scotland, MD

## 2023-01-21 ENCOUNTER — Encounter: Payer: Self-pay | Admitting: Urology

## 2023-01-21 ENCOUNTER — Ambulatory Visit: Payer: Medicare Other | Admitting: Urology

## 2023-01-21 VITALS — BP 154/79 | HR 85 | Ht 76.0 in | Wt 194.0 lb

## 2023-01-21 DIAGNOSIS — R310 Gross hematuria: Secondary | ICD-10-CM | POA: Diagnosis not present

## 2023-01-21 DIAGNOSIS — N3289 Other specified disorders of bladder: Secondary | ICD-10-CM | POA: Diagnosis not present

## 2023-01-21 DIAGNOSIS — Z8551 Personal history of malignant neoplasm of bladder: Secondary | ICD-10-CM

## 2023-01-21 LAB — URINALYSIS, COMPLETE
Bilirubin, UA: NEGATIVE
Glucose, UA: NEGATIVE
Ketones, UA: NEGATIVE
Nitrite, UA: NEGATIVE
Protein,UA: NEGATIVE
RBC, UA: NEGATIVE
Specific Gravity, UA: 1.01 (ref 1.005–1.030)
Urobilinogen, Ur: 0.2 mg/dL (ref 0.2–1.0)
pH, UA: 6 (ref 5.0–7.5)

## 2023-01-21 LAB — MICROSCOPIC EXAMINATION

## 2023-01-21 NOTE — Progress Notes (Signed)
    01/21/23   CC:  Chief Complaint  Patient presents with   Cysto    HPI: Michael Holloway is a 89 y.o. male with a personal history of recurrent urothelial carcinoma, who returns today for surveillance cystoscopy.   In 03/2017, he was noted to have small recurrences mostly conistent with low-grade noninvasive lesions.  He is now undergone 3 office procedures for an office fulguration for low-grade superficial appearing tumors including on 03/31/2017, 05/2016, and 11/2017.     Additionally he had low-grade appearing recurrence at the dome and underwent office fulguration of these 01/2019.    In 06/2018 he returned to the OR for repeat TURBT for small tumors involving the prostatic urethra and bladder neck, small.  Tumors were primarily low-grade with some high-grade features, superficial.  Muscularis propria was present but not involved.  Intravesical gemcitabine was instilled.   He underwent BCG and full induction course in 05/2019.    He underwent another TURBT on 07/18/2019.  Carpeting of low-grade appearing papillary tumor at dome of bladder, just to the right of previously fulgurated area in the office, measuring approximately 1 cm.  There is a few small satellite lesions as well ranging from 1 to 2 mm. Unremarkable retrograde pyelogram.    He had another recurrence and returned to OR 03/26/2020.   Papillary carpeting involving the right lateral bladder wall extending towards bladder neck, at least 2.5 cm x 2 cm area in diameter. There is also a small area of carpeting at the dome, less than 1 cm.  Bilateral retrograde unremarkable. Chemotherapy was instilled postoperatively, gemcitabine.  Surgical pathology consistent with recurrent low-grade noninvasive disease.   He is now s/p induction course of intravesical gemcitabine.   Doing well today, no urinary complaints.  Returns today for 21-month interval cystoscopy given the finding of some mild erythema at the dome for reassessment.      Vitals:   01/21/23 1012  BP: (!) 154/79  Pulse: 85    NED. A&Ox3.   No respiratory distress   Abd soft, NT, ND Normal phallus with bilateral descended testicles  Cystoscopy Procedure Note  Patient identification was confirmed, informed consent was obtained, and patient was prepped using Betadine solution.  Lidocaine jelly was administered per urethral meatus.     Pre-Procedure: - Inspection reveals a normal caliber ureteral meatus.  Procedure: The flexible cystoscope was introduced without difficulty - No urethral strictures/lesions are present. - Enlarged prostate slightly irregular fossa  - Normal bladder neck - Bilateral ureteral orifices identified - Bladder mucosa  reveals no ulcers, tumors, or lesions - Multiple stellate scars  - No bladder stones -Moderate trabeculation particularly at the dome where there are multiple hypervascular areas likely from scarring; stable without any papillary tumor  Retroflexion very small approximately 5 mm papillary lesion on the anterior bladder neck only seen on retroflexion consistent with low-grade recurrence.   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  He is interested in office fulguration.  He understands risk and benefits.  He we will have him return for this in the near future.  Return for cystoscopy/fulguration of bladder tumor   Vanna Scotland, MD

## 2023-02-03 ENCOUNTER — Encounter: Payer: Self-pay | Admitting: Urology

## 2023-02-03 ENCOUNTER — Ambulatory Visit: Payer: Medicare Other | Admitting: Urology

## 2023-02-03 VITALS — BP 141/67 | HR 90 | Ht 70.0 in | Wt 194.0 lb

## 2023-02-03 DIAGNOSIS — R310 Gross hematuria: Secondary | ICD-10-CM

## 2023-02-03 DIAGNOSIS — N3289 Other specified disorders of bladder: Secondary | ICD-10-CM | POA: Diagnosis not present

## 2023-02-03 DIAGNOSIS — Z8551 Personal history of malignant neoplasm of bladder: Secondary | ICD-10-CM | POA: Diagnosis not present

## 2023-02-03 LAB — URINALYSIS, COMPLETE
Bilirubin, UA: NEGATIVE
Glucose, UA: NEGATIVE
Ketones, UA: NEGATIVE
Leukocytes,UA: NEGATIVE
Nitrite, UA: NEGATIVE
Specific Gravity, UA: 1.015 (ref 1.005–1.030)
Urobilinogen, Ur: 0.2 mg/dL (ref 0.2–1.0)
pH, UA: 6 (ref 5.0–7.5)

## 2023-02-03 LAB — MICROSCOPIC EXAMINATION

## 2023-02-03 MED ORDER — CEPHALEXIN 250 MG PO CAPS
500.0000 mg | ORAL_CAPSULE | Freq: Once | ORAL | Status: AC
Start: 2023-02-03 — End: 2023-02-03
  Administered 2023-02-03: 500 mg via ORAL

## 2023-02-03 NOTE — Progress Notes (Signed)
Bladder Instillation  Due to Fulguration patient is present today for a Bladder Instillation of Lidocaine 2%. Patient was cleaned and prepped in a sterile fashion with betadine and lidocaine 2% jelly was instilled into the urethra.  A 14 FR catheter was inserted, urine return was noted 15 ml, urine was yellow in color.  60  ml was instilled into the bladder. The catheter was then removed. Patient tolerated well, no complications were noted. Patient held in bladder for 30 minutes prior to procedure starting.   Performed by: Kayden Hutmacher H RMA  Follow up/ Additional notes: none

## 2023-02-03 NOTE — Progress Notes (Signed)
    02/03/23   CC:  Chief Complaint  Patient presents with   Cysto fulgiration    HPI: Michael Holloway is a 89 y.o. male with a personal history of recurrent urothelial carcinoma, who returns today for surveillance cystoscopy.   In 03/2017, he was noted to have small recurrences mostly conistent with low-grade noninvasive lesions.  He is now undergone 3 office procedures for an office fulguration for low-grade superficial appearing tumors including on 03/31/2017, 05/2016, and 11/2017.     Additionally he had low-grade appearing recurrence at the dome and underwent office fulguration of these 01/2019.    In 06/2018 he returned to the OR for repeat TURBT for small tumors involving the prostatic urethra and bladder neck, small.  Tumors were primarily low-grade with some high-grade features, superficial.  Muscularis propria was present but not involved.  Intravesical gemcitabine was instilled.   He underwent BCG and full induction course in 05/2019.    He underwent another TURBT on 07/18/2019.  Carpeting of low-grade appearing papillary tumor at dome of bladder, just to the right of previously fulgurated area in the office, measuring approximately 1 cm.  There is a few small satellite lesions as well ranging from 1 to 2 mm. Unremarkable retrograde pyelogram.    He had another recurrence and returned to OR 03/26/2020.   Papillary carpeting involving the right lateral bladder wall extending towards bladder neck, at least 2.5 cm x 2 cm area in diameter. There is also a small area of carpeting at the dome, less than 1 cm.  Bilateral retrograde unremarkable. Chemotherapy was instilled postoperatively, gemcitabine.  Surgical pathology consistent with recurrent low-grade noninvasive disease.   He s/p induction course of intravesical gemcitabine.   Most recently noted to have a recurrence on his bladder neck noted on retroflexion.   Vitals:   02/03/23 1057  BP: (!) 141/67  Pulse: 90    NED.  A&Ox3.   No respiratory distress   Abd soft, NT, ND Normal phallus with bilateral descended testicles  Cystoscopy Procedure Note  Patient identification was confirmed, informed consent was obtained, and patient was prepped using Betadine solution.  Lidocaine jelly was administered per urethral meatus.    Final dose of Keflex was given preprocedure.  Bladder was instilled with lidocaine solution for 30 minutes prior to today's procedure.  Please see CMA note.  Pre-Procedure: - Inspection reveals a normal caliber ureteral meatus.  Procedure: The flexible cystoscope was introduced without difficulty - No urethral strictures/lesions are present. - Enlarged prostate slightly irregular fossa  - Normal bladder neck - Bilateral ureteral orifices identified - Bladder mucosa  reveals no ulcers, tumors, or lesions - Multiple stellate scars  - No bladder stones -Moderate trabeculation particularly at the dome where there are multiple hypervascular areas likely from scarring; stable without any papillary tumor  Retroflexion very small approximately 5 mm papillary lesion on the anterior bladder neck only seen on retroflexion consistent with low-grade recurrence.  Using sterile water at the medium, Bugbee electrocautery was used to completely destroy the flat papillary lesion in question along with the perimeter margins.  No residual viable tumor remained.  The procedure was well-tolerated.   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

## 2023-03-03 ENCOUNTER — Other Ambulatory Visit: Payer: Self-pay | Admitting: Podiatry

## 2023-03-03 ENCOUNTER — Other Ambulatory Visit (HOSPITAL_COMMUNITY): Payer: Self-pay | Admitting: Podiatry

## 2023-03-03 DIAGNOSIS — M79672 Pain in left foot: Secondary | ICD-10-CM

## 2023-03-03 DIAGNOSIS — L97521 Non-pressure chronic ulcer of other part of left foot limited to breakdown of skin: Secondary | ICD-10-CM

## 2023-03-03 DIAGNOSIS — L03116 Cellulitis of left lower limb: Secondary | ICD-10-CM

## 2023-03-03 DIAGNOSIS — L97522 Non-pressure chronic ulcer of other part of left foot with fat layer exposed: Secondary | ICD-10-CM

## 2023-03-03 DIAGNOSIS — G5793 Unspecified mononeuropathy of bilateral lower limbs: Secondary | ICD-10-CM

## 2023-03-06 ENCOUNTER — Ambulatory Visit (INDEPENDENT_AMBULATORY_CARE_PROVIDER_SITE_OTHER): Payer: BLUE CROSS/BLUE SHIELD | Admitting: Vascular Surgery

## 2023-03-06 ENCOUNTER — Encounter (INDEPENDENT_AMBULATORY_CARE_PROVIDER_SITE_OTHER): Payer: Medicare Other

## 2023-03-13 ENCOUNTER — Other Ambulatory Visit: Payer: Self-pay

## 2023-03-13 ENCOUNTER — Emergency Department
Admission: EM | Admit: 2023-03-13 | Discharge: 2023-03-13 | Disposition: A | Discharge status: Home / Self Care | Payer: Medicare Other | Attending: Emergency Medicine | Admitting: Emergency Medicine

## 2023-03-13 ENCOUNTER — Encounter: Payer: Self-pay | Admitting: Emergency Medicine

## 2023-03-13 DIAGNOSIS — M542 Cervicalgia: Secondary | ICD-10-CM | POA: Insufficient documentation

## 2023-03-13 DIAGNOSIS — I10 Essential (primary) hypertension: Secondary | ICD-10-CM | POA: Insufficient documentation

## 2023-03-13 DIAGNOSIS — R531 Weakness: Secondary | ICD-10-CM

## 2023-03-13 LAB — TROPONIN I (HIGH SENSITIVITY)
Troponin I (High Sensitivity): 11 ng/L (ref <18)
Troponin I (High Sensitivity): 13 ng/L (ref <18)

## 2023-03-13 LAB — BASIC METABOLIC PANEL
Anion gap: 11 (ref 5–15)
BUN: 53 mg/dL — ABNORMAL HIGH (ref 8–23)
CO2: 22 mmol/L (ref 22–32)
Calcium: 9.1 mg/dL (ref 8.9–10.3)
Chloride: 106 mmol/L (ref 98–111)
Creatinine, Ser: 1.91 mg/dL — ABNORMAL HIGH (ref 0.61–1.24)
GFR, Estimated: 32 mL/min — ABNORMAL LOW (ref >60)
Glucose, Bld: 121 mg/dL — ABNORMAL HIGH (ref 70–99)
Potassium: 4.1 mmol/L (ref 3.5–5.1)
Sodium: 139 mmol/L (ref 135–145)

## 2023-03-13 LAB — CBC
HCT: 43.6 % (ref 39.0–52.0)
Hemoglobin: 14.4 g/dL (ref 13.0–17.0)
MCH: 27.6 pg (ref 26.0–34.0)
MCHC: 33 g/dL (ref 30.0–36.0)
MCV: 83.5 fL (ref 80.0–100.0)
Platelets: 324 10*3/uL (ref 150–400)
RBC: 5.22 MIL/uL (ref 4.22–5.81)
RDW: 13.9 % (ref 11.5–15.5)
WBC: 13 10*3/uL — ABNORMAL HIGH (ref 4.0–10.5)
nRBC: 0 % (ref 0.0–0.2)

## 2023-03-13 LAB — CBG MONITORING, ED: Glucose-Capillary: 109 mg/dL — ABNORMAL HIGH (ref 70–99)

## 2023-03-13 NOTE — ED Notes (Addendum)
First nurse note: Pt here via [facility] from home with c/o of hypertension. Pt c/o of weakness. EMS denies HX of hypertension but endorses HX of a-fib.   226/110   20L FA  CBG 128

## 2023-03-13 NOTE — Discharge Instructions (Signed)
Please seek medical attention for any high fevers, chest pain, shortness of breath, change in behavior, persistent vomiting, bloody stool or any other new or concerning symptoms.  

## 2023-03-13 NOTE — ED Triage Notes (Signed)
Pt BIB EMS from home. Pt states he was sitting on the toilet for a long time and unsure if he passed out. Son came home while pt was on the toilet and called EMS. Pt denies any pain. Pt alert and oriented x 4

## 2023-03-13 NOTE — ED Provider Notes (Signed)
[facility] Provider Note    Event Date/Time   First MD Initiated Contact with Patient 03/13/23 1514     (approximate)   History   Weakness   HPI  Michael Holloway is a 89 y.o. male  who presents to the emergency department today because of concern for an episode of weakness, altered mental status and possible syncope. The patient states that this occurred when he was getting up this morning and went to use the restroom. When he sat on the toilet he stayed there for a long time. His son who is at bedside, states that he would check on him and occasionally the patient would have a hard time answering him. The patient has had similar episodes in the past but has never had trouble answering questions before. Per chart review the patient did see his PCP recently for neck pain and was prescribed baclofen and steroid. Additionally he is on doxycycline and Augmentin for foot cellulitis.       Physical Exam   Triage Vital Signs: ED Triage Vitals  Encounter Vitals Group     BP 03/13/23 0956 (!) 193/97     Systolic BP Percentile --      Diastolic BP Percentile --      Pulse Rate 03/13/23 0956 64     Resp 03/13/23 0956 18     Temp 03/13/23 0956 98.4 F (36.9 C)     Temp Source 03/13/23 1227 Oral     SpO2 03/13/23 0956 100 %     Weight 03/13/23 0956 194 lb 0.1 oz (88 kg)     Height --      Head Circumference --      Peak Flow --      Pain Score 03/13/23 0956 0     Pain Loc --      Pain Education --      Exclude from Growth Chart --     Most recent vital signs: Vitals:   03/13/23 0956 03/13/23 1227  BP: (!) 193/97 (!) 184/92  Pulse: 64 60  Resp: 18 18  Temp: 98.4 F (36.9 C) 98.1 F (36.7 C)  SpO2: 100% 94%    General: Awake, alert, oriented. CV:  Good peripheral perfusion. Regular rate and rhythm. Resp:  Normal effort. Lungs clear. Abd:  No distention.   ED Results / Procedures / Treatments   Labs (all labs ordered are listed, but only  abnormal results are displayed) Labs Reviewed  BASIC METABOLIC PANEL - Abnormal; Notable for the following components:      Result Value   Glucose, Bld 121 (*)    BUN 53 (*)    Creatinine, Ser 1.91 (*)    GFR, Estimated 32 (*)    All other components within normal limits  CBC - Abnormal; Notable for the following components:   WBC 13.0 (*)    All other components within normal limits  CBG MONITORING, ED - Abnormal; Notable for the following components:   Glucose-Capillary 109 (*)    All other components within normal limits  URINALYSIS, ROUTINE W REFLEX MICROSCOPIC  TROPONIN I (HIGH SENSITIVITY)  TROPONIN I (HIGH SENSITIVITY)  TROPONIN I (HIGH SENSITIVITY)     EKG  I, Phineas Semen, attending physician, personally viewed and interpreted this EKG  EKG Time: 1006 Rate: 65 Rhythm: atrial paced rhythm with prolonged av conduction Axis: normal Intervals: qtc 411 QRS: narrow ST changes: no st elevation Impression: abnormal ekg   RADIOLOGY None  PROCEDURES:  Critical Care performed: No    MEDICATIONS ORDERED IN ED: Medications - No data to display   IMPRESSION / MDM / ASSESSMENT AND PLAN / ED COURSE  I reviewed the triage vital signs and the nursing notes.                              Differential diagnosis includes, but is not limited to, anemia, dehydration, infection, medication side effect, cardiac etiology  Patient's presentation is most consistent with acute presentation with potential threat to life or bodily function.   The patient is on the cardiac monitor to evaluate for evidence of arrhythmia and/or significant heart rate changes.  Patient presents to the emergency department today because of concern for an episode of weakness and confusion. At the time of my exam patient is awake and alert. EKG without concerning arrhythmia. Creatinine is slightly elevated in blood work but consistent with recent lab values. No anemia. Slight leukocytosis which  could be due to steroids, no fever to suggest infection. Will add on troponin to blood work obtained at triage.  Troponin without concerning elevation. Patient without any further episodes. Do wonder if the episode today might have been caused by the baclofen. Discussed this possibility with patient and family. Will plan on discharging.       FINAL CLINICAL IMPRESSION(S) / ED DIAGNOSES   Final diagnoses:  Weakness      Note:  This document was prepared using Dragon voice recognition software and may include unintentional dictation errors.    Phineas Semen, MD 03/13/23 [phone]

## 2023-03-24 ENCOUNTER — Ambulatory Visit (HOSPITAL_COMMUNITY)
Admission: RE | Admit: 2023-03-24 | Discharge: 2023-03-24 | Disposition: A | Discharge status: Home / Self Care | Payer: Medicare Other | Source: Ambulatory Visit | Attending: Podiatry | Admitting: Podiatry

## 2023-03-24 DIAGNOSIS — L03116 Cellulitis of left lower limb: Secondary | ICD-10-CM | POA: Insufficient documentation

## 2023-03-24 DIAGNOSIS — G5793 Unspecified mononeuropathy of bilateral lower limbs: Secondary | ICD-10-CM | POA: Diagnosis present

## 2023-03-24 DIAGNOSIS — M79672 Pain in left foot: Secondary | ICD-10-CM | POA: Insufficient documentation

## 2023-03-24 DIAGNOSIS — L97522 Non-pressure chronic ulcer of other part of left foot with fat layer exposed: Secondary | ICD-10-CM | POA: Insufficient documentation

## 2023-03-24 DIAGNOSIS — L97521 Non-pressure chronic ulcer of other part of left foot limited to breakdown of skin: Secondary | ICD-10-CM | POA: Diagnosis present

## 2023-03-26 ENCOUNTER — Inpatient Hospital Stay
Admission: EM | Admit: 2023-03-26 | Discharge: 2023-03-29 | DRG: 464 | Disposition: A | Discharge status: Home / Self Care | Payer: Medicare Other | Attending: Internal Medicine | Admitting: Internal Medicine

## 2023-03-26 ENCOUNTER — Other Ambulatory Visit: Payer: Self-pay

## 2023-03-26 ENCOUNTER — Encounter: Payer: Self-pay | Admitting: Emergency Medicine

## 2023-03-26 DIAGNOSIS — Z96652 Presence of left artificial knee joint: Secondary | ICD-10-CM | POA: Diagnosis not present

## 2023-03-26 DIAGNOSIS — K219 Gastro-esophageal reflux disease without esophagitis: Secondary | ICD-10-CM | POA: Diagnosis present

## 2023-03-26 DIAGNOSIS — I771 Stricture of artery: Secondary | ICD-10-CM | POA: Diagnosis not present

## 2023-03-26 DIAGNOSIS — I495 Sick sinus syndrome: Secondary | ICD-10-CM | POA: Diagnosis present

## 2023-03-26 DIAGNOSIS — Z87891 Personal history of nicotine dependence: Secondary | ICD-10-CM | POA: Diagnosis not present

## 2023-03-26 DIAGNOSIS — Z85828 Personal history of other malignant neoplasm of skin: Secondary | ICD-10-CM

## 2023-03-26 DIAGNOSIS — I5042 Chronic combined systolic (congestive) and diastolic (congestive) heart failure: Secondary | ICD-10-CM | POA: Diagnosis present

## 2023-03-26 DIAGNOSIS — L03116 Cellulitis of left lower limb: Secondary | ICD-10-CM | POA: Diagnosis present

## 2023-03-26 DIAGNOSIS — I739 Peripheral vascular disease, unspecified: Secondary | ICD-10-CM | POA: Diagnosis present

## 2023-03-26 DIAGNOSIS — Z882 Allergy status to sulfonamides status: Secondary | ICD-10-CM

## 2023-03-26 DIAGNOSIS — M869 Osteomyelitis, unspecified: Secondary | ICD-10-CM

## 2023-03-26 DIAGNOSIS — Z1152 Encounter for screening for COVID-19: Secondary | ICD-10-CM

## 2023-03-26 DIAGNOSIS — L97529 Non-pressure chronic ulcer of other part of left foot with unspecified severity: Secondary | ICD-10-CM | POA: Diagnosis present

## 2023-03-26 DIAGNOSIS — Z79899 Other long term (current) drug therapy: Secondary | ICD-10-CM

## 2023-03-26 DIAGNOSIS — Z950 Presence of cardiac pacemaker: Secondary | ICD-10-CM

## 2023-03-26 DIAGNOSIS — C678 Malignant neoplasm of overlapping sites of bladder: Secondary | ICD-10-CM | POA: Diagnosis present

## 2023-03-26 DIAGNOSIS — N1832 Chronic kidney disease, stage 3b: Secondary | ICD-10-CM

## 2023-03-26 DIAGNOSIS — Z8551 Personal history of malignant neoplasm of bladder: Secondary | ICD-10-CM

## 2023-03-26 DIAGNOSIS — I708 Atherosclerosis of other arteries: Secondary | ICD-10-CM | POA: Diagnosis present

## 2023-03-26 DIAGNOSIS — Z7982 Long term (current) use of aspirin: Secondary | ICD-10-CM

## 2023-03-26 DIAGNOSIS — I10 Essential (primary) hypertension: Secondary | ICD-10-CM

## 2023-03-26 DIAGNOSIS — G629 Polyneuropathy, unspecified: Secondary | ICD-10-CM | POA: Diagnosis present

## 2023-03-26 DIAGNOSIS — Z9079 Acquired absence of other genital organ(s): Secondary | ICD-10-CM | POA: Diagnosis not present

## 2023-03-26 DIAGNOSIS — I130 Hypertensive heart and chronic kidney disease with heart failure and stage 1 through stage 4 chronic kidney disease, or unspecified chronic kidney disease: Secondary | ICD-10-CM | POA: Diagnosis present

## 2023-03-26 DIAGNOSIS — Z87442 Personal history of urinary calculi: Secondary | ICD-10-CM | POA: Diagnosis not present

## 2023-03-26 DIAGNOSIS — I70245 Atherosclerosis of native arteries of left leg with ulceration of other part of foot: Secondary | ICD-10-CM | POA: Diagnosis not present

## 2023-03-26 DIAGNOSIS — E785 Hyperlipidemia, unspecified: Secondary | ICD-10-CM | POA: Diagnosis present

## 2023-03-26 DIAGNOSIS — Z23 Encounter for immunization: Secondary | ICD-10-CM | POA: Diagnosis present

## 2023-03-26 DIAGNOSIS — Z95820 Peripheral vascular angioplasty status with implants and grafts: Secondary | ICD-10-CM | POA: Diagnosis not present

## 2023-03-26 DIAGNOSIS — M1990 Unspecified osteoarthritis, unspecified site: Secondary | ICD-10-CM | POA: Diagnosis present

## 2023-03-26 DIAGNOSIS — M86172 Other acute osteomyelitis, left ankle and foot: Principal | ICD-10-CM

## 2023-03-26 DIAGNOSIS — Z96651 Presence of right artificial knee joint: Secondary | ICD-10-CM | POA: Diagnosis present

## 2023-03-26 LAB — RESP PANEL BY RT-PCR (RSV, FLU A&B, COVID)  RVPGX2
Influenza A by PCR: NEGATIVE
Influenza B by PCR: NEGATIVE
Resp Syncytial Virus by PCR: NEGATIVE
SARS Coronavirus 2 by RT PCR: NEGATIVE

## 2023-03-26 LAB — CBC WITH DIFFERENTIAL/PLATELET
Abs Immature Granulocytes: 0.02 10*3/uL (ref 0.00–0.07)
Basophils Absolute: 0 10*3/uL (ref 0.0–0.1)
Basophils Relative: 1 %
Eosinophils Absolute: 0.1 10*3/uL (ref 0.0–0.5)
Eosinophils Relative: 1 %
HCT: 44 % (ref 39.0–52.0)
Hemoglobin: 14.1 g/dL (ref 13.0–17.0)
Immature Granulocytes: 0 %
Lymphocytes Relative: 9 %
Lymphs Abs: 0.7 10*3/uL (ref 0.7–4.0)
MCH: 28 pg (ref 26.0–34.0)
MCHC: 32 g/dL (ref 30.0–36.0)
MCV: 87.3 fL (ref 80.0–100.0)
Monocytes Absolute: 0.4 10*3/uL (ref 0.1–1.0)
Monocytes Relative: 6 %
Neutro Abs: 6.5 10*3/uL (ref 1.7–7.7)
Neutrophils Relative %: 83 %
Platelets: 231 10*3/uL (ref 150–400)
RBC: 5.04 MIL/uL (ref 4.22–5.81)
RDW: 14.6 % (ref 11.5–15.5)
WBC: 7.8 10*3/uL (ref 4.0–10.5)
nRBC: 0 % (ref 0.0–0.2)

## 2023-03-26 LAB — COMPREHENSIVE METABOLIC PANEL
ALT: 13 U/L (ref 0–44)
AST: 20 U/L (ref 15–41)
Albumin: 3.6 g/dL (ref 3.5–5.0)
Alkaline Phosphatase: 102 U/L (ref 38–126)
Anion gap: 10 (ref 5–15)
BUN: 31 mg/dL — ABNORMAL HIGH (ref 8–23)
CO2: 24 mmol/L (ref 22–32)
Calcium: 8.7 mg/dL — ABNORMAL LOW (ref 8.9–10.3)
Chloride: 107 mmol/L (ref 98–111)
Creatinine, Ser: 1.92 mg/dL — ABNORMAL HIGH (ref 0.61–1.24)
GFR, Estimated: 32 mL/min — ABNORMAL LOW (ref >60)
Glucose, Bld: 106 mg/dL — ABNORMAL HIGH (ref 70–99)
Potassium: 4 mmol/L (ref 3.5–5.1)
Sodium: 141 mmol/L (ref 135–145)
Total Bilirubin: 0.3 mg/dL (ref <1.2)
Total Protein: 6.8 g/dL (ref 6.5–8.1)

## 2023-03-26 LAB — PROTIME-INR
INR: 1.1 (ref 0.8–1.2)
Prothrombin Time: 14.5 s (ref 11.4–15.2)

## 2023-03-26 LAB — LACTIC ACID, PLASMA: Lactic Acid, Venous: 1.1 mmol/L (ref 0.5–1.9)

## 2023-03-26 LAB — BRAIN NATRIURETIC PEPTIDE: B Natriuretic Peptide: 92.6 pg/mL (ref 0.0–100.0)

## 2023-03-26 LAB — APTT: aPTT: 28 s (ref 24–36)

## 2023-03-26 LAB — SEDIMENTATION RATE: Sed Rate: 10 mm/h (ref 0–20)

## 2023-03-26 MED ORDER — METRONIDAZOLE 500 MG/100ML IV SOLN
500.0000 mg | Freq: Once | INTRAVENOUS | Status: AC
  Administered 2023-03-26: 500 mg via INTRAVENOUS
  Filled 2023-03-26: qty 100

## 2023-03-26 MED ORDER — VANCOMYCIN HCL IN DEXTROSE 1-5 GM/200ML-% IV SOLN: 1000.0000 mg | Freq: Once | INTRAVENOUS | Status: DC

## 2023-03-26 MED ORDER — OXYCODONE-ACETAMINOPHEN 5-325 MG PO TABS
1.0000 | ORAL_TABLET | ORAL | Status: DC | PRN
  Administered 2023-03-26: 1 via ORAL
  Filled 2023-03-26: qty 1

## 2023-03-26 MED ORDER — SODIUM CHLORIDE 0.9 % IV SOLN
2.0000 g | Freq: Once | INTRAVENOUS | Status: AC
  Administered 2023-03-26: 2 g via INTRAVENOUS
  Filled 2023-03-26: qty 12.5

## 2023-03-26 MED ORDER — HEPARIN SODIUM (PORCINE) 5000 UNIT/ML IJ SOLN
5000.0000 [IU] | Freq: Three times a day (TID) | INTRAMUSCULAR | Status: DC
  Administered 2023-03-26 – 2023-03-27 (×2): 5000 [IU] via SUBCUTANEOUS
  Filled 2023-03-26 (×3): qty 1

## 2023-03-26 MED ORDER — SODIUM CHLORIDE 0.9 % IV SOLN
1.0000 g | INTRAVENOUS | Status: DC
  Administered 2023-03-27 – 2023-03-28 (×2): 1 g via INTRAVENOUS
  Filled 2023-03-26 (×2): qty 10

## 2023-03-26 MED ORDER — GABAPENTIN 600 MG PO TABS: 600.0000 mg | ORAL_TABLET | Freq: Three times a day (TID) | ORAL | Status: DC

## 2023-03-26 MED ORDER — ONDANSETRON HCL 4 MG/2ML IJ SOLN: 4.0000 mg | Freq: Three times a day (TID) | INTRAMUSCULAR | Status: DC | PRN

## 2023-03-26 MED ORDER — LACTATED RINGERS IV SOLN: INTRAVENOUS | Status: DC

## 2023-03-26 MED ORDER — VANCOMYCIN HCL 1500 MG/300ML IV SOLN: 1500.0000 mg | INTRAVENOUS | Status: DC

## 2023-03-26 MED ORDER — HYDRALAZINE HCL 20 MG/ML IJ SOLN: 5.0000 mg | INTRAMUSCULAR | Status: DC | PRN

## 2023-03-26 MED ORDER — OXYCODONE-ACETAMINOPHEN 5-325 MG PO TABS: 1.0000 | ORAL_TABLET | ORAL | Status: DC | PRN

## 2023-03-26 MED ORDER — GABAPENTIN 300 MG PO CAPS
900.0000 mg | ORAL_CAPSULE | Freq: Every day | ORAL | Status: DC
  Administered 2023-03-26 – 2023-03-28 (×3): 900 mg via ORAL
  Filled 2023-03-26 (×3): qty 3

## 2023-03-26 MED ORDER — VANCOMYCIN HCL 1500 MG/300ML IV SOLN
1500.0000 mg | Freq: Once | INTRAVENOUS | Status: AC
  Administered 2023-03-26: 1500 mg via INTRAVENOUS
  Filled 2023-03-26: qty 300

## 2023-03-26 MED ORDER — ACETAMINOPHEN 325 MG PO TABS
650.0000 mg | ORAL_TABLET | Freq: Four times a day (QID) | ORAL | Status: DC | PRN
  Administered 2023-03-27 – 2023-03-28 (×2): 650 mg via ORAL
  Filled 2023-03-26 (×2): qty 2

## 2023-03-26 MED ORDER — GABAPENTIN 300 MG PO CAPS
600.0000 mg | ORAL_CAPSULE | Freq: Two times a day (BID) | ORAL | Status: DC
  Administered 2023-03-27 – 2023-03-29 (×5): 600 mg via ORAL
  Filled 2023-03-26 (×5): qty 2

## 2023-03-26 NOTE — ED Triage Notes (Signed)
Pt via POV from home. Pt has been on and off abx for the past couple of months for a L wound infection. Pt has peripheral neuropathy and has minimal feeling in his L leg. Pt just finished a course of abx a week ago and infection has not gotten any better. Cap refill less than 3 second. Skin warm and dry. Pt is A&Ox4 and NAD

## 2023-03-26 NOTE — Sepsis Progress Note (Signed)
Notified bedside nurse of need to order lactic acid and blood cultures then hang the antibiotics.

## 2023-03-26 NOTE — ED Provider Notes (Signed)
[facility] Provider Note   Event Date/Time   First MD Initiated Contact with Patient 03/26/23 1748     (approximate) History  Foot Pain  HPI Michael Holloway is a 89 y.o. male with a chronic wound to the left lateral foot who presents after being called by her his podiatrist with concerns for possible osteomyelitis on a recent MRI.  Patient states that he just finished a course of antibiotics for this infection however it has not improved.  Patient states that he has peripheral neuropathy and lacks sensation to this foot and therefore denies pain. ROS: Patient currently denies any vision changes, tinnitus, difficulty speaking, facial droop, sore throat, chest pain, shortness of breath, abdominal pain, nausea/vomiting/diarrhea, dysuria, or weakness/numbness/paresthesias in any extremity   Physical Exam  Triage Vital Signs: ED Triage Vitals  Encounter Vitals Group     BP 03/26/23 1210 (!) 151/74     Systolic BP Percentile --      Diastolic BP Percentile --      Pulse Rate 03/26/23 1210 83     Resp 03/26/23 1210 17     Temp 03/26/23 1210 97.8 F (36.6 C)     Temp Source 03/26/23 1210 Oral     SpO2 03/26/23 1210 98 %     Weight --      Height --      Head Circumference --      Peak Flow --      Pain Score 03/26/23 1216 0     Pain Loc --      Pain Education --      Exclude from Growth Chart --    Most recent vital signs: Vitals:   03/26/23 1934 03/26/23 2112  BP:  (!) 175/76  Pulse:  77  Resp:  18  Temp: 97.9 F (36.6 C) 98.1 F (36.7 C)  SpO2:  100%   General: Awake, oriented x4. CV:  Good peripheral perfusion.  Resp:  Normal effort.  Abd:  No distention.  Other:  Elderly well-developed, well-nourished Caucasian male resting comfortably in no acute distress.  Ulceration with surrounding erythema to the distal left lateral aspect and plantar surface of the left foot ED Results / Procedures / Treatments  Labs (all labs ordered are listed, but  only abnormal results are displayed) Labs Reviewed  COMPREHENSIVE METABOLIC PANEL - Abnormal; Notable for the following components:      Result Value   Glucose, Bld 106 (*)    BUN 31 (*)    Creatinine, Ser 1.92 (*)    Calcium 8.7 (*)    GFR, Estimated 32 (*)    All other components within normal limits  RESP PANEL BY RT-PCR (RSV, FLU A&B, COVID)  RVPGX2  CULTURE, BLOOD (ROUTINE X 2)  CULTURE, BLOOD (ROUTINE X 2)  LACTIC ACID, PLASMA  CBC WITH DIFFERENTIAL/PLATELET  PROTIME-INR  APTT  C-REACTIVE PROTEIN  SEDIMENTATION RATE  BRAIN NATRIURETIC PEPTIDE  BASIC METABOLIC PANEL  CBC   PROCEDURES: Critical Care performed: No .1-3 Lead EKG Interpretation  Performed by: Merwyn Katos, MD Authorized by: Merwyn Katos, MD     Interpretation: normal     ECG rate:  71   ECG rate assessment: normal     Rhythm: sinus rhythm     Ectopy: none     Conduction: normal    MEDICATIONS ORDERED IN ED: Medications  vancomycin (VANCOREADY) IVPB 1500 mg/300 mL (has no administration in time range)  ondansetron (ZOFRAN) injection 4 mg (  has no administration in time range)  acetaminophen (TYLENOL) tablet 650 mg (has no administration in time range)  hydrALAZINE (APRESOLINE) injection 5 mg (has no administration in time range)  heparin injection 5,000 Units (has no administration in time range)  cefTRIAXone (ROCEPHIN) 1 g in sodium chloride 0.9 % 100 mL IVPB (has no administration in time range)  vancomycin (VANCOREADY) IVPB 1500 mg/300 mL (has no administration in time range)  ceFEPIme (MAXIPIME) 2 g in sodium chloride 0.9 % 100 mL IVPB (0 g Intravenous Stopped 03/26/23 1911)  metroNIDAZOLE (FLAGYL) IVPB 500 mg (500 mg Intravenous New Bag/Given 03/26/23 1946)   IMPRESSION / MDM / ASSESSMENT AND PLAN / ED COURSE  I reviewed the triage vital signs and the nursing notes.                             The patient is on the cardiac monitor to evaluate for evidence of arrhythmia and/or significant  heart rate changes. Patient's presentation is most consistent with acute presentation with potential threat to life or bodily function. Patient is a 89 year old male who presents for possible bony infection of of the lower extremity Given history, exam, and workup I have low suspicion for necrotizing fasciitis, abscess, DVT, or other emergent problem as a cause for this presentation  Interventions: Vancomycin, cefepime Consults: Hospitalist, podiatry Dispo: Admit   FINAL CLINICAL IMPRESSION(S) / ED DIAGNOSES   Final diagnoses:  Acute osteomyelitis of toe of left foot (HCC)   Rx / DC Orders   ED Discharge Orders     None      Note:  This document was prepared using Dragon voice recognition software and may include unintentional dictation errors.   Merwyn Katos, MD 03/26/23 [phone]

## 2023-03-26 NOTE — Sepsis Progress Note (Signed)
Spoke with Donna Bernard, MD who activated code sepsis, no fluid bluses warranted as patients LA was not elevated and patient is actually on the hypertensive side.

## 2023-03-26 NOTE — Consult Note (Signed)
Hospital Consult    Reason for Consult:  Left MTPJ with noted non healing infection  Requesting Physician:  Dr Gwyneth Revels MD MRN #:  [id number]  History of Present Illness: This is a 89 y.o. male seen in outpatient Podiatry clinic today for worsening redness and drainage to his left MTPJ with chronic ulceration. Recent MRI shows uptake within the bone consistent with osteomyelitis. Patient followed by Podiatry. Patient endorses he had the same problem on his right foot years ago and it started in the same fashion. No complaints overnight and vitals all remain stable. Vascular surgery consulted to evaluate.   Past Medical History:  Diagnosis Date   Allergy    Arthritis    Bladder cancer (HCC)    Cancer (HCC)    Basal Cell Skin Cancer   Cataract    Chronic kidney disease    Stage 3 CKD   Dysrhythmia    GERD (gastroesophageal reflux disease)    Gout    History of kidney stones    Hypertension    Hypotension 05/24/2020   Neuropathy    Presence of permanent cardiac pacemaker 2020   Spinal stenosis     Past Surgical History:  Procedure Laterality Date   BACK SURGERY     CERVICAL SPINE SURGERY      X 2   CHOLECYSTECTOMY     CYSTOSCOPY W/ RETROGRADES Bilateral 08/25/2016   Procedure: CYSTOSCOPY WITH RETROGRADE PYELOGRAM;  Surgeon: Vanna Scotland, MD;  Location: [facility];  Service: Urology;  Laterality: Bilateral;   CYSTOSCOPY W/ RETROGRADES Bilateral 07/18/2019   Procedure: CYSTOSCOPY WITH RETROGRADE PYELOGRAM;  Surgeon: Vanna Scotland, MD;  Location: [facility];  Service: Urology;  Laterality: Bilateral;   CYSTOSCOPY W/ RETROGRADES Bilateral 03/26/2020   Procedure: CYSTOSCOPY WITH RETROGRADE PYELOGRAM;  Surgeon: Vanna Scotland, MD;  Location: [facility];  Service: Urology;  Laterality: Bilateral;   EYE SURGERY Bilateral    Cataract Extraction with IOL   JOINT REPLACEMENT Bilateral    total knee replacements   LOOP RECORDER INSERTION N/A 06/03/2017   Procedure: LOOP RECORDER  INSERTION;  Surgeon: Marcina Millard, MD;  Location: [facility];  Service: Cardiovascular;  Laterality: N/A;   LUMBAR LAMINECTOMY      X 4   PACEMAKER INSERTION Left 08/03/2018   Procedure: INSERTION PACEMAKER DUALCHAMBER;  Surgeon: Marcina Millard, MD;  Location: [facility];  Service: Cardiovascular;  Laterality: Left;   PROSTATE SURGERY  1990s   Dr. Sheppard Penton ,in office procedure   TONSILLECTOMY     TOTAL KNEE ARTHROPLASTY Right 09/29/2019   Procedure: TOTAL KNEE ARTHROPLASTY;  Surgeon: Christena Flake, MD;  Location: [facility];  Service: Orthopedics;  Laterality: Right;   TRANSURETHRAL RESECTION OF BLADDER TUMOR WITH MITOMYCIN-C N/A 08/25/2016   Procedure: TRANSURETHRAL RESECTION OF BLADDER TUMOR WITH MITOMYCIN-C;  Surgeon: Vanna Scotland, MD;  Location: [facility];  Service: Urology;  Laterality: N/A;   TRANSURETHRAL RESECTION OF BLADDER TUMOR WITH MITOMYCIN-C N/A 07/18/2019   Procedure: TRANSURETHRAL RESECTION OF BLADDER TUMOR WITH gemcitabine;  Surgeon: Vanna Scotland, MD;  Location: [facility];  Service: Urology;  Laterality: N/A;   TRANSURETHRAL RESECTION OF BLADDER TUMOR WITH MITOMYCIN-C N/A 03/26/2020   Procedure: TRANSURETHRAL RESECTION OF BLADDER TUMOR WITH gemcitabine;  Surgeon: Vanna Scotland, MD;  Location: [facility];  Service: Urology;  Laterality: N/A;   TRANSURETHRAL RESECTION OF PROSTATE N/A 06/14/2018   Procedure: TRANSURETHRAL RESECTION OF THE PROSTATE (TURP) with Gemcitabine;  Surgeon: Vanna Scotland, MD;  Location: [facility];  Service: Urology;  Laterality: N/A;  Allergies  Allergen Reactions   Sulfa Antibiotics Rash    Prior to Admission medications   Medication Sig Start Date End Date Taking? Authorizing Provider  ASPIRIN 81 PO Take by mouth.    [provider]  gabapentin (NEURONTIN) 600 MG tablet Take 600 mg by mouth See admin instructions. Take 600 mg by mouth in the morning and afternoon and 900 mg at bedtime 07/22/16   [provider]   omeprazole (PRILOSEC) 20 MG capsule Take 20 mg by mouth every morning.  05/19/16   [provider]  Sodium Chloride-Xylitol (XLEAR SINUS CARE SPRAY NA) Place 1 spray into the nose daily.    [provider]  traMADol (ULTRAM) 50 MG tablet Take 50 mg by mouth 3 (three) times daily as needed for moderate pain.  06/16/16   [provider]    Social History   Socioeconomic History   Marital status: Widowed    Spouse name: Not on file   Number of children: Not on file   Years of education: Not on file   Highest education level: Not on file  Occupational History   Occupation: industrial engineering/ management  Tobacco Use   Smoking status: Former    Current packs/day: 0.00    Average packs/day: 0.5 packs/day for 30.0 years (15.0 ttl pk-yrs)    Types: Cigarettes    Start date: 08/10/1948    Quit date: 08/11/1978    Years since quitting: 44.6   Smokeless tobacco: Never  Vaping Use   Vaping status: Never Used  Substance and Sexual Activity   Alcohol use: Not Currently    Comment: 1 BEER DAILY   Drug use: No   Sexual activity: Not Currently  Other Topics Concern   Not on file  Social History Narrative   Patient lives with his son, Kasean.  He feels safe in his home.   Very active. Goes to the [employer] regularly, works on his computer and putters around his home.   Independant   Social Determinants of Health   Financial Resource Strain: Not on file  Food Insecurity: Not on file  Transportation Needs: Not on file  Physical Activity: Not on file  Stress: Not on file  Social Connections: Not on file  Intimate Partner Violence: Not on file     Family History  Problem Relation Age of Onset   Cancer Mother    Stroke Father    Bladder Cancer Neg Hx    Kidney cancer Neg Hx    Prostate cancer Neg Hx     ROS: Otherwise negative unless mentioned in HPI  Physical Examination  Vitals:   03/26/23 1930 03/26/23 1934  BP:    Pulse:    Resp: 16   Temp:  97.9 F  (36.6 C)  SpO2:     There is no height or weight on file to calculate BMI.  General:  WDWN in NAD Gait: Not observed HENT: WNL, normocephalic Pulmonary: normal non-labored breathing, without Rales, rhonchi,  wheezing Cardiac: regular, without  Murmurs, rubs or gallops; without carotid bruits Abdomen: Positive bowel sounds throughout, soft, NT/ND, no masses Skin: without rashes Vascular Exam/Pulses: Unable to palpate left DP/PT. Foot is cool to touch at the toes. Right lower extremity with palpable pulses.  Extremities: with ischemic changes, without Gangrene , with cellulitis; with open wounds;  Musculoskeletal: no muscle wasting or atrophy  Neurologic: A&O X 3;  No focal weakness or paresthesias are detected; speech is fluent/normal Psychiatric:  The pt has Normal  affect. Lymph:  Unremarkable  CBC    Component Value Date/Time   WBC 7.8 03/26/2023 1219   RBC 5.04 03/26/2023 1219   HGB 14.1 03/26/2023 1219   HCT 44.0 03/26/2023 1219   PLT 231 03/26/2023 1219   MCV 87.3 03/26/2023 1219   MCH 28.0 03/26/2023 1219   MCHC 32.0 03/26/2023 1219   RDW 14.6 03/26/2023 1219   LYMPHSABS 0.7 03/26/2023 1219   MONOABS 0.4 03/26/2023 1219   EOSABS 0.1 03/26/2023 1219   BASOSABS 0.0 03/26/2023 1219    BMET    Component Value Date/Time   NA 141 03/26/2023 1219   K 4.0 03/26/2023 1219   CL 107 03/26/2023 1219   CO2 24 03/26/2023 1219   GLUCOSE 106 (H) 03/26/2023 1219   BUN 31 (H) 03/26/2023 1219   CREATININE 1.92 (H) 03/26/2023 1219   CALCIUM 8.7 (L) 03/26/2023 1219   GFRNONAA 32 (L) 03/26/2023 1219   GFRAA 47 (L) 10/01/2019 0416    COAGS: Lab Results  Component Value Date   INR 1.1 03/26/2023   INR 1.0 07/20/2018     Non-Invasive Vascular Imaging:   MRI of the left Foot Ordered. No results at this time.   Statin:  No. Beta Blocker:  No. Aspirin:  Yes.   ACEI:  No. ARB:  No. CCB use:  No Other antiplatelets/anticoagulants:  No.    ASSESSMENT/PLAN: This is a 89  y.o. male who presented to outpatient podiatry clinic for worsening redness and drainage to his left MTP J with chronic ulceration.  Recent MRI demonstrates osteomyelitis to the bone in this area.  Vascular surgery consulted to evaluate.  Vascular surgery plans on taking the patient to the vascular lab for a left lower extremity angiogram today on 03/27/2023.  I discussed in detail with the patient the procedure, benefits, risk, complications.  He wishes to proceed as soon as possible.  He verbalizes understanding and I answered all his questions this morning.  Patient was made n.p.o. after midnight and endorses he has not eaten or drink anything since that time.   -I discussed the plan in detail with Dr Festus Barren Md and he is in agreement with the plan.    Marcie [facility] 03/26/2023 9:04 PM

## 2023-03-26 NOTE — Progress Notes (Signed)
PHARMACY -  BRIEF ANTIBIOTIC NOTE   Pharmacy has received consult(s) for cefepime and vancomycin from an ED provider.  The patient's profile has been reviewed for ht/wt/allergies/indication/available labs.    One time order(s) placed for   1) cefepime 2 grams IV x 1  2) vancomycin 1500 mg IV x 1  Further antibiotics/pharmacy consults should be ordered by admitting physician if indicated.                       Thank you, Lowella Bandy 03/26/2023  6:28 PM

## 2023-03-26 NOTE — H&P (Signed)
History and Physical    Michael Holloway DOB: 1929/04/01 DOA: 03/26/2023  Referring MD/NP/PA:   PCP: Mick Sell, MD   Patient coming from:  The patient is coming from home.     Chief Complaint: left foot ulcer  HPI: Michael Holloway is a 89 y.o. male with medical history significant of  bladder cancer (s/p of TURBT, s/p of BCG), HTN, HLD, SSS (s/p of PPM), sCHF with EF 45%, gout, CKD-3B, peripheral neuropathy, who presents with left foot ulcer.  Patient states that he has left foot ulcer for more than 2 weeks. Pt has been following up with Dr. Alberteen Spindle of podiatry, and completed a course of antibiotics a week ago without improvement. He was called by her his podiatrist with concerns for possible osteomyelitis on a recent MRI.  Patient does not have fever or chills.  He has peripheral neuropathy, and does not feel pain in his foot.  Patient does not have chest pain, cough, SOB.  No nausea, vomiting, diarrhea or abdominal pain.  No symptoms of UTI.  Data reviewed independently and ED Course: pt was found to have WBC 7.8, slightly worsening renal function, lactic acid 1.1, temperature normal, blood pressure 152/82, heart rate 95, RR 20, oxygen saturation 100% on room air.  Patient is admitted to telemetry bed as inpatient.  [facility] NP, Rolla Plate is consulted. Dr. Excell Seltzer of podiatry is planning to do surgery tomorrow per ED physician.   EKG:   Not done in ED, will get one.    Review of Systems:   General: no fevers, chills, no body weight gain, fatigue HEENT: no blurry vision, hearing changes or sore throat Respiratory: no dyspnea, coughing, wheezing CV: no chest pain, no palpitations GI: no nausea, vomiting, abdominal pain, diarrhea, constipation GU: no dysuria, burning on urination, increased urinary frequency, hematuria  Ext: no leg edema Neuro: no unilateral weakness, no vision change or hearing loss Skin: has left foot ulcer MSK: No muscle spasm, no deformity, no  limitation of range of movement in spin Heme: No easy bruising.  Travel history: No recent long distant travel.   Allergy:  Allergies  Allergen Reactions   Sulfa Antibiotics Rash    Past Medical History:  Diagnosis Date   Allergy    Arthritis    Bladder cancer (HCC)    Cancer (HCC)    Basal Cell Skin Cancer   Cataract    Chronic kidney disease    Stage 3 CKD   Dysrhythmia    GERD (gastroesophageal reflux disease)    Gout    History of kidney stones    Hypertension    Hypotension 05/24/2020   Neuropathy    Presence of permanent cardiac pacemaker 2020   Spinal stenosis     Past Surgical History:  Procedure Laterality Date   BACK SURGERY     CERVICAL SPINE SURGERY      X 2   CHOLECYSTECTOMY     CYSTOSCOPY W/ RETROGRADES Bilateral 08/25/2016   Procedure: CYSTOSCOPY WITH RETROGRADE PYELOGRAM;  Surgeon: Vanna Scotland, MD;  Location: [facility];  Service: Urology;  Laterality: Bilateral;   CYSTOSCOPY W/ RETROGRADES Bilateral 07/18/2019   Procedure: CYSTOSCOPY WITH RETROGRADE PYELOGRAM;  Surgeon: Vanna Scotland, MD;  Location: [facility];  Service: Urology;  Laterality: Bilateral;   CYSTOSCOPY W/ RETROGRADES Bilateral 03/26/2020   Procedure: CYSTOSCOPY WITH RETROGRADE PYELOGRAM;  Surgeon: Vanna Scotland, MD;  Location: [facility];  Service: Urology;  Laterality: Bilateral;   EYE SURGERY Bilateral  Cataract Extraction with IOL   JOINT REPLACEMENT Bilateral    total knee replacements   LOOP RECORDER INSERTION N/A 06/03/2017   Procedure: LOOP RECORDER INSERTION;  Surgeon: Marcina Millard, MD;  Location: [facility];  Service: Cardiovascular;  Laterality: N/A;   LUMBAR LAMINECTOMY      X 4   PACEMAKER INSERTION Left 08/03/2018   Procedure: INSERTION PACEMAKER DUALCHAMBER;  Surgeon: Marcina Millard, MD;  Location: [facility];  Service: Cardiovascular;  Laterality: Left;   PROSTATE SURGERY  1990s   Dr. Sheppard Penton ,in office procedure   TONSILLECTOMY     TOTAL KNEE  ARTHROPLASTY Right 09/29/2019   Procedure: TOTAL KNEE ARTHROPLASTY;  Surgeon: Christena Flake, MD;  Location: [facility];  Service: Orthopedics;  Laterality: Right;   TRANSURETHRAL RESECTION OF BLADDER TUMOR WITH MITOMYCIN-C N/A 08/25/2016   Procedure: TRANSURETHRAL RESECTION OF BLADDER TUMOR WITH MITOMYCIN-C;  Surgeon: Vanna Scotland, MD;  Location: [facility];  Service: Urology;  Laterality: N/A;   TRANSURETHRAL RESECTION OF BLADDER TUMOR WITH MITOMYCIN-C N/A 07/18/2019   Procedure: TRANSURETHRAL RESECTION OF BLADDER TUMOR WITH gemcitabine;  Surgeon: Vanna Scotland, MD;  Location: [facility];  Service: Urology;  Laterality: N/A;   TRANSURETHRAL RESECTION OF BLADDER TUMOR WITH MITOMYCIN-C N/A 03/26/2020   Procedure: TRANSURETHRAL RESECTION OF BLADDER TUMOR WITH gemcitabine;  Surgeon: Vanna Scotland, MD;  Location: [facility];  Service: Urology;  Laterality: N/A;   TRANSURETHRAL RESECTION OF PROSTATE N/A 06/14/2018   Procedure: TRANSURETHRAL RESECTION OF THE PROSTATE (TURP) with Gemcitabine;  Surgeon: Vanna Scotland, MD;  Location: [facility];  Service: Urology;  Laterality: N/A;    Social History:  reports that he quit smoking about 44 years ago. His smoking use included cigarettes. He started smoking about 74 years ago. He has a 15 pack-year smoking history. He has never used smokeless tobacco. He reports that he does not currently use alcohol. He reports that he does not use drugs.  Family History:  Family History  Problem Relation Age of Onset   Cancer Mother    Stroke Father    Bladder Cancer Neg Hx    Kidney cancer Neg Hx    Prostate cancer Neg Hx      Prior to Admission medications   Medication Sig Start Date End Date Taking? Authorizing Provider  ASPIRIN 81 PO Take by mouth.    [provider]  gabapentin (NEURONTIN) 600 MG tablet Take 600 mg by mouth See admin instructions. Take 600 mg by mouth in the morning and afternoon and 900 mg at bedtime 07/22/16   [provider]   omeprazole (PRILOSEC) 20 MG capsule Take 20 mg by mouth every morning.  05/19/16   [provider]  Sodium Chloride-Xylitol (XLEAR SINUS CARE SPRAY NA) Place 1 spray into the nose daily.    [provider]  traMADol (ULTRAM) 50 MG tablet Take 50 mg by mouth 3 (three) times daily as needed for moderate pain.  06/16/16   [provider]    Physical Exam: Vitals:   03/26/23 1930 03/26/23 1934 03/26/23 2112 03/26/23 2356  BP:   (!) 175/76 132/68  Pulse:   77 66  Resp: 16  18 19   Temp:  97.9 F (36.6 C) 98.1 F (36.7 C) 98.4 F (36.9 C)  TempSrc:  Oral Oral Oral  SpO2:   100% 97%   General: Not in acute distress HEENT:       Eyes: PERRL, EOMI, no jaundice       ENT: No discharge from  the ears and nose, no pharynx injection, no tonsillar enlargement.        Neck: No JVD, no bruit, no mass felt. Heme: No neck lymph node enlargement. Cardiac: S1/S2, RRR, No murmurs, No gallops or rubs. Respiratory: No rales, wheezing, rhonchi or rubs. GI: Soft, nondistended, nontender, no rebound pain, no organomegaly, BS present. GU: No hematuria Ext: No pitting leg edema bilaterally. 1+DP/PT pulse bilaterally. Musculoskeletal: No joint deformities, No joint redness or warmth, no limitation of ROM in spin. Skin: Has an ulcer in lateral side of left foot with surrounding mild erythema      Neuro: Alert, oriented X3, cranial nerves II-XII grossly intact, moves all extremities normally.  Psych: Patient is not psychotic, no suicidal or hemocidal ideation.  Labs on Admission: I have personally reviewed following labs and imaging studies  CBC: Recent Labs  Lab 03/26/23 1219  WBC 7.8  NEUTROABS 6.5  HGB 14.1  HCT 44.0  MCV 87.3  PLT 231   Basic Metabolic Panel: Recent Labs  Lab 03/26/23 1219  NA 141  K 4.0  CL 107  CO2 24  GLUCOSE 106*  BUN 31*  CREATININE 1.92*  CALCIUM 8.7*   GFR: Estimated Creatinine Clearance: 27.4 mL/min (A) (by C-G formula based on  SCr of 1.92 mg/dL (H)). Liver Function Tests: Recent Labs  Lab 03/26/23 1219  AST 20  ALT 13  ALKPHOS 102  BILITOT 0.3  PROT 6.8  ALBUMIN 3.6   No results for input(s): "LIPASE", "AMYLASE" in the last 168 hours. No results for input(s): "AMMONIA" in the last 168 hours. Coagulation Profile: Recent Labs  Lab 03/26/23 1839  INR 1.1   Cardiac Enzymes: No results for input(s): "CKTOTAL", "CKMB", "CKMBINDEX", "TROPONINI" in the last 168 hours. BNP (last 3 results) No results for input(s): "PROBNP" in the last 8760 hours. HbA1C: No results for input(s): "HGBA1C" in the last 72 hours. CBG: No results for input(s): "GLUCAP" in the last 168 hours. Lipid Profile: No results for input(s): "CHOL", "HDL", "LDLCALC", "TRIG", "CHOLHDL", "LDLDIRECT" in the last 72 hours. Thyroid Function Tests: No results for input(s): "TSH", "T4TOTAL", "FREET4", "T3FREE", "THYROIDAB" in the last 72 hours. Anemia Panel: No results for input(s): "VITAMINB12", "FOLATE", "FERRITIN", "TIBC", "IRON", "RETICCTPCT" in the last 72 hours. Urine analysis:    Component Value Date/Time   COLORURINE YELLOW (A) 06/14/2020 0843   APPEARANCEUR Clear 02/03/2023 1106   LABSPEC 1.010 06/14/2020 0843   PHURINE 6.0 06/14/2020 0843   GLUCOSEU Negative 02/03/2023 1106   HGBUR NEGATIVE 06/14/2020 0843   BILIRUBINUR Negative 02/03/2023 1106   KETONESUR NEGATIVE 06/14/2020 0843   PROTEINUR 1+ (A) 02/03/2023 1106   PROTEINUR 30 (A) 06/14/2020 0843   NITRITE Negative 02/03/2023 1106   NITRITE NEGATIVE 06/14/2020 0843   LEUKOCYTESUR Negative 02/03/2023 1106   LEUKOCYTESUR MODERATE (A) 06/14/2020 0843   Sepsis Labs: @LABRCNTIP (procalcitonin:4,lacticidven:4) ) Recent Results (from the past 240 hour(s))  Resp panel by RT-PCR (RSV, Flu A&B, Covid) Anterior Nasal Swab     Status: None   Collection Time: 03/26/23  7:03 PM   Specimen: Anterior Nasal Swab  Result Value Ref Range Status   SARS Coronavirus 2 by RT PCR NEGATIVE  NEGATIVE Final    Comment: (NOTE) SARS-CoV-2 target nucleic acids are NOT DETECTED.  The SARS-CoV-2 RNA is generally detectable in upper respiratory specimens during the acute phase of infection. The lowest concentration of SARS-CoV-2 viral copies this assay can detect is 138 copies/mL. A negative result does not preclude SARS-Cov-2 infection and should not  be used as the sole basis for treatment or other patient management decisions. A negative result may occur with  improper specimen collection/handling, submission of specimen other than nasopharyngeal swab, presence of viral mutation(s) within the areas targeted by this assay, and inadequate number of viral copies(<138 copies/mL). A negative result must be combined with clinical observations, patient history, and epidemiological information. The expected result is Negative.  Fact Sheet for Patients:  BloggerCourse.com  Fact Sheet for Healthcare Providers:  SeriousBroker.it  This test is no t yet approved or cleared by the [place] FDA and  has been authorized for detection and/or diagnosis of SARS-CoV-2 by FDA under an Emergency Use Authorization (EUA). This EUA will remain  in effect (meaning this test can be used) for the duration of the COVID-19 declaration under Section 564(b)(1) of the Act, 21 U.S.C.section 360bbb-3(b)(1), unless the authorization is terminated  or revoked sooner.       Influenza A by PCR NEGATIVE NEGATIVE Final   Influenza B by PCR NEGATIVE NEGATIVE Final    Comment: (NOTE) The Xpert Xpress SARS-CoV-2/FLU/RSV plus assay is intended as an aid in the diagnosis of influenza from Nasopharyngeal swab specimens and should not be used as a sole basis for treatment. Nasal washings and aspirates are unacceptable for Xpert Xpress SARS-CoV-2/FLU/RSV testing.  Fact Sheet for Patients: BloggerCourse.com  Fact Sheet for Healthcare  Providers: SeriousBroker.it  This test is not yet approved or cleared by the [place] FDA and has been authorized for detection and/or diagnosis of SARS-CoV-2 by FDA under an Emergency Use Authorization (EUA). This EUA will remain in effect (meaning this test can be used) for the duration of the COVID-19 declaration under Section 564(b)(1) of the Act, 21 U.S.C. section 360bbb-3(b)(1), unless the authorization is terminated or revoked.     Resp Syncytial Virus by PCR NEGATIVE NEGATIVE Final    Comment: (NOTE) Fact Sheet for Patients: BloggerCourse.com  Fact Sheet for Healthcare Providers: SeriousBroker.it  This test is not yet approved or cleared by the [place] FDA and has been authorized for detection and/or diagnosis of SARS-CoV-2 by FDA under an Emergency Use Authorization (EUA). This EUA will remain in effect (meaning this test can be used) for the duration of the COVID-19 declaration under Section 564(b)(1) of the Act, 21 U.S.C. section 360bbb-3(b)(1), unless the authorization is terminated or revoked.  Performed at [facility], [address], [place], [place] [address]      Radiological Exams on Admission: No results found.    Assessment/Plan Principal Problem:   Osteomyelitis of left foot (HCC) Active Problems:   Chronic combined systolic and diastolic CHF (congestive heart failure) (HCC)   Essential hypertension   Stage 3b chronic kidney disease (HCC)   Malignant neoplasm of overlapping sites of bladder (HCC)   Assessment and Plan:  Osteomyelitis of left foot (HCC): pt is not septic. VS NP, Rolla Plate is consulted. Dr. Excell Seltzer of podiatry is planning to do surgery tomorrow per ED physician.  - will admit to tele bed as inpatient - Empiric antimicrobial treatment with vancomycin and Rocephin (patient received 1 dose of cefepime and Flagyl in ED) - Blood cultures x  2  - ESR and CRP  Chronic combined systolic and diastolic CHF (congestive heart failure) (HCC): 2D echo 02/15/2021 showed EF of 45 with grade 1 diastolic dysfunction.  Patient does not have leg edema or JVD.  No shortness of breath.  CHF is compensated. -Check BNP  Essential hypertension: Patient is not taking medications currently.  Blood pressure 152/82 -IV hydralazine as needed  Stage 3b chronic kidney disease (HCC): Slightly worsening than baseline.  Recent baseline creatinine 1.81 on 06/14/2020, and then 191 on 03/13/2023. -Follow-up with BMP  Malignant neoplasm of overlapping sites of bladder [facility]): Has s/p of TURBT and BCG treatment -Follow-up with Dr. Jimmey Ralph for urology      DVT ppx: SQ Heparin     Code Status: Full code     Family Communication:  Yes, patient's son at bed side.    Disposition Plan:  Anticipate discharge back to previous environment  Consults called:  [facility] NP, Rolla Plate is consulted. Dr. Excell Seltzer of podiatry is planning to do surgery tomorrow per ED physician.   Admission status and Level of care: Telemetry Medical:     as inpt      Dispo: The patient is from: Home              Anticipated d/c is to: Home              Anticipated d/c date is: 2 days              Patient currently is not medically stable to d/c.    Severity of Illness:  The appropriate patient status for this patient is INPATIENT. Inpatient status is judged to be reasonable and necessary in order to provide the required intensity of service to ensure the patient's safety. The patient's presenting symptoms, physical exam findings, and initial radiographic and laboratory data in the context of their chronic comorbidities is felt to place them at high risk for further clinical deterioration. Furthermore, it is not anticipated that the patient will be medically stable for discharge from the hospital within 2 midnights of admission.   * I certify that at the point of admission it is my  clinical judgment that the patient will require inpatient hospital care spanning beyond 2 midnights from the point of admission due to high intensity of service, high risk for further deterioration and high frequency of surveillance required.*       Date of Service 03/27/2023    Lorretta Harp [facility]   If 7PM-7AM, please contact night-coverage [email or web address] 03/27/2023, 12:01 AM

## 2023-03-26 NOTE — Progress Notes (Signed)
Pharmacy Antibiotic Note  Michael Holloway is a 89 y.o. male admitted on 03/26/2023 with L foot osteomyelitis.  Pharmacy has been consulted for vancomycin dosing. Serum creatinine is noted to be elevated but appears near apparent baseline level  Plan: start vancomycin 1500 mg IV  x1 then 1500 mg IV every 48 hours  Goal AUC 400-550. Expected AUC: 465.3 SCr used: 1.92 mg/dL Ke 5.621H-0, Q6/5 78.4O     Temp (24hrs), Avg:97.9 F (36.6 C), Min:97.8 F (36.6 C), Max:98.1 F (36.7 C)  Recent Labs  Lab 03/26/23 1219  WBC 7.8  CREATININE 1.92*  LATICACIDVEN 1.1    Estimated Creatinine Clearance: 27.4 mL/min (A) (by C-G formula based on SCr of 1.92 mg/dL (H)).    Allergies  Allergen Reactions   Sulfa Antibiotics Rash    Antimicrobials this admission: 11/14 ceftriaxone >>  11/14 vancomycin >>   Microbiology results: 11/14 BCx: pending  Thank you for allowing pharmacy to be a part of this patient's care.  Lowella Bandy 03/26/2023 9:17 PM

## 2023-03-26 NOTE — Sepsis Progress Note (Signed)
Elink monitoring for the code sepsis protocol.  

## 2023-03-26 NOTE — Consult Note (Signed)
Brief note.  Patient was seen today in outpatient clinic.  Worsening redness with purulent drainage to his left with MTPJ. With chronic ulceration.  History of chronic neuropathy.  Not diabetic. MRI recently showed increased uptake within the bone consistent with osteomyelitis.  I recommended admission for vascular workup and patient will need eventual fifth ray amputation most likely.  Patient has barely palpable pulses.  I will go ahead and place consult for vascular in preparation for possible intervention tomorrow.

## 2023-03-26 NOTE — Code Documentation (Signed)
CODE SEPSIS - PHARMACY COMMUNICATION  **Broad Spectrum Antibiotics should be administered within 1 hour of Sepsis diagnosis**  Time Code Sepsis Called/Page Received: 1823  Antibiotics Ordered: cefepime, metronidazole, vancomycin  Time of 1st antibiotic administration: 1841  Additional action taken by pharmacy: none required  If necessary, Name of Provider/Nurse Contacted: N/A    Lowella Bandy ,PharmD Clinical Pharmacist  03/26/2023  6:27 PM

## 2023-03-27 ENCOUNTER — Encounter
Admission: EM | Disposition: A | Discharge status: Home / Self Care | Payer: Self-pay | Source: Home / Self Care | Attending: Internal Medicine

## 2023-03-27 ENCOUNTER — Encounter: Payer: Self-pay | Admitting: Vascular Surgery

## 2023-03-27 DIAGNOSIS — I10 Essential (primary) hypertension: Secondary | ICD-10-CM | POA: Diagnosis not present

## 2023-03-27 DIAGNOSIS — I771 Stricture of artery: Secondary | ICD-10-CM

## 2023-03-27 DIAGNOSIS — N1832 Chronic kidney disease, stage 3b: Secondary | ICD-10-CM | POA: Diagnosis not present

## 2023-03-27 DIAGNOSIS — L97529 Non-pressure chronic ulcer of other part of left foot with unspecified severity: Secondary | ICD-10-CM | POA: Diagnosis not present

## 2023-03-27 DIAGNOSIS — Z96652 Presence of left artificial knee joint: Secondary | ICD-10-CM | POA: Diagnosis not present

## 2023-03-27 DIAGNOSIS — I70245 Atherosclerosis of native arteries of left leg with ulceration of other part of foot: Secondary | ICD-10-CM

## 2023-03-27 DIAGNOSIS — I5042 Chronic combined systolic (congestive) and diastolic (congestive) heart failure: Secondary | ICD-10-CM | POA: Diagnosis not present

## 2023-03-27 DIAGNOSIS — M86172 Other acute osteomyelitis, left ankle and foot: Principal | ICD-10-CM

## 2023-03-27 DIAGNOSIS — M869 Osteomyelitis, unspecified: Secondary | ICD-10-CM | POA: Diagnosis not present

## 2023-03-27 HISTORY — PX: LOWER EXTREMITY ANGIOGRAPHY: CATH118251

## 2023-03-27 LAB — BASIC METABOLIC PANEL
Anion gap: 10 (ref 5–15)
BUN: 30 mg/dL — ABNORMAL HIGH (ref 8–23)
CO2: 20 mmol/L — ABNORMAL LOW (ref 22–32)
Calcium: 8.1 mg/dL — ABNORMAL LOW (ref 8.9–10.3)
Chloride: 111 mmol/L (ref 98–111)
Creatinine, Ser: 1.86 mg/dL — ABNORMAL HIGH (ref 0.61–1.24)
GFR, Estimated: 34 mL/min — ABNORMAL LOW (ref >60)
Glucose, Bld: 72 mg/dL (ref 70–99)
Potassium: 3.7 mmol/L (ref 3.5–5.1)
Sodium: 141 mmol/L (ref 135–145)

## 2023-03-27 LAB — CBC
HCT: 35.1 % — ABNORMAL LOW (ref 39.0–52.0)
Hemoglobin: 11.6 g/dL — ABNORMAL LOW (ref 13.0–17.0)
MCH: 27.7 pg (ref 26.0–34.0)
MCHC: 33 g/dL (ref 30.0–36.0)
MCV: 83.8 fL (ref 80.0–100.0)
Platelets: 195 10*3/uL (ref 150–400)
RBC: 4.19 MIL/uL — ABNORMAL LOW (ref 4.22–5.81)
RDW: 14.6 % (ref 11.5–15.5)
WBC: 6.6 10*3/uL (ref 4.0–10.5)
nRBC: 0 % (ref 0.0–0.2)

## 2023-03-27 LAB — SURGICAL PCR SCREEN
MRSA, PCR: NEGATIVE
Staphylococcus aureus: NEGATIVE

## 2023-03-27 LAB — C-REACTIVE PROTEIN: CRP: <0.5 mg/dL (ref <1.0)

## 2023-03-27 SURGERY — LOWER EXTREMITY ANGIOGRAPHY
Anesthesia: Moderate Sedation | Laterality: Left

## 2023-03-27 MED ORDER — HEPARIN SODIUM (PORCINE) 1000 UNIT/ML IJ SOLN
INTRAMUSCULAR | Status: AC
  Filled 2023-03-27: qty 10

## 2023-03-27 MED ORDER — ASPIRIN 81 MG PO TBEC: 81.0000 mg | DELAYED_RELEASE_TABLET | Freq: Every day | ORAL | Status: DC

## 2023-03-27 MED ORDER — CLOPIDOGREL BISULFATE 75 MG PO TABS
75.0000 mg | ORAL_TABLET | Freq: Every day | ORAL | Status: DC
  Administered 2023-03-29: 75 mg via ORAL
  Filled 2023-03-27: qty 1

## 2023-03-27 MED ORDER — IODIXANOL 320 MG/ML IV SOLN
INTRAVENOUS | Status: DC | PRN
  Administered 2023-03-27: 65 mL via INTRA_ARTERIAL

## 2023-03-27 MED ORDER — LIDOCAINE-EPINEPHRINE (PF) 1 %-1:200000 IJ SOLN
INTRAMUSCULAR | Status: DC | PRN
  Administered 2023-03-27: 10 mL via INTRADERMAL

## 2023-03-27 MED ORDER — MIDAZOLAM HCL 2 MG/ML PO SYRP: 8.0000 mg | ORAL_SOLUTION | Freq: Once | ORAL | Status: DC | PRN

## 2023-03-27 MED ORDER — INFLUENZA VAC A&B SURF ANT ADJ 0.5 ML IM SUSY
0.5000 mL | PREFILLED_SYRINGE | INTRAMUSCULAR | Status: AC
  Administered 2023-03-28: 0.5 mL via INTRAMUSCULAR
  Filled 2023-03-27: qty 0.5

## 2023-03-27 MED ORDER — ASPIRIN 81 MG PO TBEC
81.0000 mg | DELAYED_RELEASE_TABLET | Freq: Every day | ORAL | Status: DC
  Administered 2023-03-29: 81 mg via ORAL
  Filled 2023-03-27: qty 1

## 2023-03-27 MED ORDER — MIDAZOLAM HCL 2 MG/2ML IJ SOLN
INTRAMUSCULAR | Status: AC
  Filled 2023-03-27: qty 2

## 2023-03-27 MED ORDER — MIDAZOLAM HCL 2 MG/2ML IJ SOLN
INTRAMUSCULAR | Status: DC | PRN
  Administered 2023-03-27: 1 mg via INTRAVENOUS

## 2023-03-27 MED ORDER — VANCOMYCIN HCL IN DEXTROSE 1-5 GM/200ML-% IV SOLN
1000.0000 mg | INTRAVENOUS | Status: DC
  Administered 2023-03-27: 1000 mg via INTRAVENOUS
  Filled 2023-03-27 (×2): qty 200

## 2023-03-27 MED ORDER — FENTANYL CITRATE PF 50 MCG/ML IJ SOSY: 12.5000 ug | PREFILLED_SYRINGE | Freq: Once | INTRAMUSCULAR | Status: DC | PRN

## 2023-03-27 MED ORDER — SODIUM CHLORIDE 0.9 % IV SOLN: INTRAVENOUS | Status: DC

## 2023-03-27 MED ORDER — MUPIROCIN 2 % EX OINT
1.0000 | TOPICAL_OINTMENT | Freq: Two times a day (BID) | CUTANEOUS | Status: DC
  Administered 2023-03-27 – 2023-03-29 (×4): 1 via NASAL
  Filled 2023-03-27: qty 22

## 2023-03-27 MED ORDER — HEPARIN SODIUM (PORCINE) 1000 UNIT/ML IJ SOLN
INTRAMUSCULAR | Status: DC | PRN
  Administered 2023-03-27: 5000 [IU] via INTRAVENOUS

## 2023-03-27 MED ORDER — HEPARIN (PORCINE) IN NACL 1000-0.9 UT/500ML-% IV SOLN
INTRAVENOUS | Status: DC | PRN
  Administered 2023-03-27: 1000 mL

## 2023-03-27 MED ORDER — FENTANYL CITRATE PF 50 MCG/ML IJ SOSY
PREFILLED_SYRINGE | INTRAMUSCULAR | Status: AC
  Filled 2023-03-27: qty 1

## 2023-03-27 MED ORDER — HEPARIN SODIUM (PORCINE) 5000 UNIT/ML IJ SOLN
5000.0000 [IU] | Freq: Three times a day (TID) | INTRAMUSCULAR | Status: DC
  Administered 2023-03-29: 5000 [IU] via SUBCUTANEOUS
  Filled 2023-03-27: qty 1

## 2023-03-27 MED ORDER — FAMOTIDINE 20 MG PO TABS: 40.0000 mg | ORAL_TABLET | Freq: Once | ORAL | Status: DC | PRN

## 2023-03-27 MED ORDER — CLOPIDOGREL BISULFATE 75 MG PO TABS
75.0000 mg | ORAL_TABLET | Freq: Every day | ORAL | Status: DC
  Administered 2023-03-27: 75 mg via ORAL
  Filled 2023-03-27: qty 1

## 2023-03-27 MED ORDER — SODIUM CHLORIDE 0.9 % IV BOLUS
250.0000 mL | Freq: Once | INTRAVENOUS | Status: AC
  Administered 2023-03-27: 250 mL via INTRAVENOUS

## 2023-03-27 MED ORDER — FENTANYL CITRATE (PF) 100 MCG/2ML IJ SOLN
INTRAMUSCULAR | Status: DC | PRN
  Administered 2023-03-27: 50 ug via INTRAVENOUS

## 2023-03-27 MED ORDER — ATORVASTATIN CALCIUM 10 MG PO TABS
10.0000 mg | ORAL_TABLET | Freq: Every day | ORAL | Status: DC
  Administered 2023-03-27 – 2023-03-29 (×3): 10 mg via ORAL
  Filled 2023-03-27 (×3): qty 1

## 2023-03-27 MED ORDER — METHYLPREDNISOLONE SODIUM SUCC 125 MG IJ SOLR: 125.0000 mg | Freq: Once | INTRAMUSCULAR | Status: DC | PRN

## 2023-03-27 MED ORDER — DIPHENHYDRAMINE HCL 50 MG/ML IJ SOLN: 50.0000 mg | Freq: Once | INTRAMUSCULAR | Status: DC | PRN

## 2023-03-27 SURGICAL SUPPLY — 23 items
BALLN ARMADA 12X60X80 (BALLOONS) ×1
BALLN ARMADA 8X40X135 (BALLOONS) ×1
BALLN ULTRVRSE 3X200X130 (BALLOONS) ×1
BALLOON ARMADA 12X60X80 (BALLOONS) IMPLANT
BALLOON ARMADA 8X40X135 (BALLOONS) IMPLANT
BALLOON ULTRVRSE 3X200X130 (BALLOONS) IMPLANT
CATH ANGIO 5F PIGTAIL 65CM (CATHETERS) IMPLANT
CATH BEACON 5 .038 100 VERT TP (CATHETERS) IMPLANT
CATH BEACON 5.038 65CM KMP-01 (CATHETERS) IMPLANT
CATH NAVICROSS ANGLED 135CM (MICROCATHETER) IMPLANT
COVER PROBE ULTRASOUND 5X96 (MISCELLANEOUS) IMPLANT
DEVICE PRESTO INFLATION (MISCELLANEOUS) IMPLANT
DEVICE STARCLOSE SE CLOSURE (Vascular Products) IMPLANT
GLIDEWIRE ADV .035X260CM (WIRE) IMPLANT
PACK ANGIOGRAPHY (CUSTOM PROCEDURE TRAY) ×2 IMPLANT
SHEATH BRITE TIP 5FRX11 (SHEATH) IMPLANT
SHEATH BRITE TIP 6FR X 23 (SHEATH) IMPLANT
SHEATH RAABE 6FRX70 (SHEATH) IMPLANT
STENT LIFESTAR 14X60 (Permanent Stent) IMPLANT
SYR MEDRAD MARK 7 150ML (SYRINGE) IMPLANT
TUBING CONTRAST HIGH PRESS 72 (TUBING) IMPLANT
WIRE G V18X300CM (WIRE) IMPLANT
WIRE GUIDERIGHT .035X150 (WIRE) IMPLANT

## 2023-03-27 NOTE — Consult Note (Signed)
PODIATRY / FOOT AND ANKLE SURGERY CONSULTATION NOTE  Requesting Physician: Dr. Clide Dales  Reason for consult: L foot infection   HPI: Michael Holloway is a 89 y.o. male who presents with a nonhealing ulceration to the L 5th MTPJ.  He was seen by Dr. Ether Griffins in outpatient clinic this week and noted to have bone exposed with cellulitis in the area of his wound.  He also had x-ray and MRI imaging confirming osteomyelitis so he was sent to the ED for evaluation and for possible vascular intervention.  He underwent revascularization today and presents resting in bed comfortably.  He denies n/v/f/c.  PMHx:  Past Medical History:  Diagnosis Date   Allergy    Arthritis    Bladder cancer (HCC)    Cancer (HCC)    Basal Cell Skin Cancer   Cataract    Chronic kidney disease    Stage 3 CKD   Dysrhythmia    GERD (gastroesophageal reflux disease)    Gout    History of kidney stones    Hypertension    Hypotension 05/24/2020   Neuropathy    Presence of permanent cardiac pacemaker 2020   Spinal stenosis     Surgical Hx:  Past Surgical History:  Procedure Laterality Date   BACK SURGERY     CERVICAL SPINE SURGERY      X 2   CHOLECYSTECTOMY     CYSTOSCOPY W/ RETROGRADES Bilateral 08/25/2016   Procedure: CYSTOSCOPY WITH RETROGRADE PYELOGRAM;  Surgeon: Vanna Scotland, MD;  Location: [facility];  Service: Urology;  Laterality: Bilateral;   CYSTOSCOPY W/ RETROGRADES Bilateral 07/18/2019   Procedure: CYSTOSCOPY WITH RETROGRADE PYELOGRAM;  Surgeon: Vanna Scotland, MD;  Location: [facility];  Service: Urology;  Laterality: Bilateral;   CYSTOSCOPY W/ RETROGRADES Bilateral 03/26/2020   Procedure: CYSTOSCOPY WITH RETROGRADE PYELOGRAM;  Surgeon: Vanna Scotland, MD;  Location: [facility];  Service: Urology;  Laterality: Bilateral;   EYE SURGERY Bilateral    Cataract Extraction with IOL   JOINT REPLACEMENT Bilateral    total knee replacements   LOOP RECORDER INSERTION N/A 06/03/2017   Procedure: LOOP RECORDER  INSERTION;  Surgeon: Marcina Millard, MD;  Location: [facility];  Service: Cardiovascular;  Laterality: N/A;   LOWER EXTREMITY ANGIOGRAPHY Left 03/27/2023   Procedure: Lower Extremity Angiography;  Surgeon: Annice Needy, MD;  Location: [facility];  Service: Cardiovascular;  Laterality: Left;   LUMBAR LAMINECTOMY      X 4   PACEMAKER INSERTION Left 08/03/2018   Procedure: INSERTION PACEMAKER DUALCHAMBER;  Surgeon: Marcina Millard, MD;  Location: [facility];  Service: Cardiovascular;  Laterality: Left;   PROSTATE SURGERY  1990s   Dr. Sheppard Penton ,in office procedure   TONSILLECTOMY     TOTAL KNEE ARTHROPLASTY Right 09/29/2019   Procedure: TOTAL KNEE ARTHROPLASTY;  Surgeon: Christena Flake, MD;  Location: [facility];  Service: Orthopedics;  Laterality: Right;   TRANSURETHRAL RESECTION OF BLADDER TUMOR WITH MITOMYCIN-C N/A 08/25/2016   Procedure: TRANSURETHRAL RESECTION OF BLADDER TUMOR WITH MITOMYCIN-C;  Surgeon: Vanna Scotland, MD;  Location: [facility];  Service: Urology;  Laterality: N/A;   TRANSURETHRAL RESECTION OF BLADDER TUMOR WITH MITOMYCIN-C N/A 07/18/2019   Procedure: TRANSURETHRAL RESECTION OF BLADDER TUMOR WITH gemcitabine;  Surgeon: Vanna Scotland, MD;  Location: [facility];  Service: Urology;  Laterality: N/A;   TRANSURETHRAL RESECTION OF BLADDER TUMOR WITH MITOMYCIN-C N/A 03/26/2020   Procedure: TRANSURETHRAL RESECTION OF BLADDER TUMOR WITH gemcitabine;  Surgeon: Vanna Scotland, MD;  Location: [facility];  Service:  Urology;  Laterality: N/A;   TRANSURETHRAL RESECTION OF PROSTATE N/A 06/14/2018   Procedure: TRANSURETHRAL RESECTION OF THE PROSTATE (TURP) with Gemcitabine;  Surgeon: Vanna Scotland, MD;  Location: [facility];  Service: Urology;  Laterality: N/A;    FHx:  Family History  Problem Relation Age of Onset   Cancer Mother    Stroke Father    Bladder Cancer Neg Hx    Kidney cancer Neg Hx    Prostate cancer Neg Hx     Social History:  reports that he quit smoking  about 44 years ago. His smoking use included cigarettes. He started smoking about 74 years ago. He has a 15 pack-year smoking history. He has never used smokeless tobacco. He reports that he does not currently use alcohol. He reports that he does not use drugs.  Allergies:  Allergies  Allergen Reactions   Sulfa Antibiotics Rash   Facility-Administered Medications Prior to Admission  Medication Dose Route Frequency Provider Last Rate Last Admin   gemcitabine (GEMZAR) 2,000 mg in sodium chloride irrigation 0.9 % chemo infusion  2,000 mg Irrigation Once Vanna Scotland, MD       gemcitabine [facility]) chemo syringe for bladder instillation 2,000 mg  2,000 mg Bladder Instillation Once Vanna Scotland, MD       gemcitabine [facility]) chemo syringe for bladder instillation 2,000 mg  2,000 mg Bladder Instillation Once Vanna Scotland, MD       lidocaine (XYLOCAINE) 2 % (with pres) injection 1,000 mg  50 mL Other Once Vanna Scotland, MD       lidocaine (XYLOCAINE) 2 % jelly 1 application  1 application  Urethral Once Vanna Scotland, MD       Medications Prior to Admission  Medication Sig Dispense Refill   ASPIRIN 81 PO Take by mouth.     gabapentin (NEURONTIN) 600 MG tablet Take 600 mg by mouth See admin instructions. Take 600 mg by mouth in the morning and afternoon and 900 mg at bedtime     Sodium Chloride-Xylitol (XLEAR SINUS CARE SPRAY NA) Place 1 spray into the nose daily.     traMADol (ULTRAM) 50 MG tablet Take 50 mg by mouth 3 (three) times daily as needed for moderate pain.      omeprazole (PRILOSEC) 20 MG capsule Take 20 mg by mouth every morning.  (Patient not taking: Reported on 03/26/2023)      Physical Exam: General: Alert and oriented.  No apparent distress.  Vascular: DP/PT pulses palpable bil, CFT intact to digits bil, no hair growth to BLE.  Neuro: Light touch sensation reduced bil  Derm: Open ulceration L sub 5th MTPJ ulceration with bone exposed, cellulitis, and palpable  fluctuance to the joint, wound measures 1 x 1 x 0.4cm.     MSK: 5/5 strength BLE MSK groups.   Results for orders placed or performed during the hospital encounter of 03/26/23 (from the past 48 hour(s))  Lactic acid, plasma     Status: None   Collection Time: 03/26/23 12:19 PM  Result Value Ref Range   Lactic Acid, Venous 1.1 0.5 - 1.9 mmol/L    Comment: Performed at [facility], [address], [place], [place] [address]  Comprehensive metabolic panel     Status: Abnormal   Collection Time: 03/26/23 12:19 PM  Result Value Ref Range   Sodium 141 135 - 145 mmol/L   Potassium 4.0 3.5 - 5.1 mmol/L   Chloride 107 98 - 111 mmol/L   CO2 24 22 - 32 mmol/L  Glucose, Bld 106 (H) 70 - 99 mg/dL    Comment: Glucose reference range applies only to samples taken after fasting for at least 8 hours.   BUN 31 (H) 8 - 23 mg/dL   Creatinine, Ser 1.61 (H) 0.61 - 1.24 mg/dL   Calcium 8.7 (L) 8.9 - 10.3 mg/dL   Total Protein 6.8 6.5 - 8.1 g/dL   Albumin 3.6 3.5 - 5.0 g/dL   AST 20 15 - 41 U/L   ALT 13 0 - 44 U/L   Alkaline Phosphatase 102 38 - 126 U/L   Total Bilirubin 0.3 <1.2 mg/dL   GFR, Estimated 32 (L) >60 mL/min    Comment: (NOTE) Calculated using the CKD-EPI Creatinine Equation (2021)    Anion gap 10 5 - 15    Comment: Performed at [facility], [address] [place], [place] [address]  CBC with Differential     Status: None   Collection Time: 03/26/23 12:19 PM  Result Value Ref Range   WBC 7.8 4.0 - 10.5 K/uL   RBC 5.04 4.22 - 5.81 MIL/uL   Hemoglobin 14.1 13.0 - 17.0 g/dL   HCT 54.0 98.1 - 19.1 %   MCV 87.3 80.0 - 100.0 fL   MCH 28.0 26.0 - 34.0 pg   MCHC 32.0 30.0 - 36.0 g/dL   RDW 47.8 29.5 - 62.1 %   Platelets 231 150 - 400 K/uL   nRBC 0.0 0.0 - 0.2 %   Neutrophils Relative % 83 %   Neutro Abs 6.5 1.7 - 7.7 K/uL   Lymphocytes Relative 9 %   Lymphs Abs 0.7 0.7 - 4.0 K/uL   Monocytes Relative 6 %   Monocytes Absolute 0.4 0.1 - 1.0 K/uL   Eosinophils  Relative 1 %   Eosinophils Absolute 0.1 0.0 - 0.5 K/uL   Basophils Relative 1 %   Basophils Absolute 0.0 0.0 - 0.1 K/uL   Immature Granulocytes 0 %   Abs Immature Granulocytes 0.02 0.00 - 0.07 K/uL    Comment: Performed at [facility], [address] [place], [place] [address]  Blood Culture (routine x 2)     Status: None (Preliminary result)   Collection Time: 03/26/23 12:19 PM   Specimen: BLOOD  Result Value Ref Range   Specimen Description BLOOD RIGHT ANTECUBITAL    Special Requests      BOTTLES DRAWN AEROBIC AND ANAEROBIC Blood Culture adequate volume   Culture      NO GROWTH < 12 HOURS Performed at [facility], [address] [place], [place] [address]    Report Status PENDING   Protime-INR     Status: None   Collection Time: 03/26/23  6:39 PM  Result Value Ref Range   Prothrombin Time 14.5 11.4 - 15.2 seconds   INR 1.1 0.8 - 1.2    Comment: (NOTE) INR goal varies based on device and disease states. Performed at [facility], [address] [place], [place] [address]   APTT     Status: None   Collection Time: 03/26/23  6:39 PM  Result Value Ref Range   aPTT 28 24 - 36 seconds    Comment: Performed at [facility], [address] [place], [place] [address]  Blood Culture (routine x 2)     Status: None (Preliminary result)   Collection Time: 03/26/23  6:58 PM   Specimen: BLOOD  Result Value Ref Range   Specimen Description BLOOD RIGHT ANTECUBITAL    Special Requests  BOTTLES DRAWN AEROBIC AND ANAEROBIC Blood Culture results may not be optimal due to an excessive volume of blood received in culture bottles   Culture      NO GROWTH < 12 HOURS Performed at [facility], [address] [place], [place] [address]    Report Status PENDING   Resp panel by RT-PCR (RSV, Flu A&B, Covid) Anterior Nasal Swab     Status: None   Collection Time: 03/26/23  7:03 PM   Specimen: Anterior Nasal Swab  Result Value Ref Range    SARS Coronavirus 2 by RT PCR NEGATIVE NEGATIVE    Comment: (NOTE) SARS-CoV-2 target nucleic acids are NOT DETECTED.  The SARS-CoV-2 RNA is generally detectable in upper respiratory specimens during the acute phase of infection. The lowest concentration of SARS-CoV-2 viral copies this assay can detect is 138 copies/mL. A negative result does not preclude SARS-Cov-2 infection and should not be used as the sole basis for treatment or other patient management decisions. A negative result may occur with  improper specimen collection/handling, submission of specimen other than nasopharyngeal swab, presence of viral mutation(s) within the areas targeted by this assay, and inadequate number of viral copies(<138 copies/mL). A negative result must be combined with clinical observations, patient history, and epidemiological information. The expected result is Negative.  Fact Sheet for Patients:  BloggerCourse.com  Fact Sheet for Healthcare Providers:  SeriousBroker.it  This test is no t yet approved or cleared by the [place] FDA and  has been authorized for detection and/or diagnosis of SARS-CoV-2 by FDA under an Emergency Use Authorization (EUA). This EUA will remain  in effect (meaning this test can be used) for the duration of the COVID-19 declaration under Section 564(b)(1) of the Act, 21 U.S.C.section 360bbb-3(b)(1), unless the authorization is terminated  or revoked sooner.       Influenza A by PCR NEGATIVE NEGATIVE   Influenza B by PCR NEGATIVE NEGATIVE    Comment: (NOTE) The Xpert Xpress SARS-CoV-2/FLU/RSV plus assay is intended as an aid in the diagnosis of influenza from Nasopharyngeal swab specimens and should not be used as a sole basis for treatment. Nasal washings and aspirates are unacceptable for Xpert Xpress SARS-CoV-2/FLU/RSV testing.  Fact Sheet for Patients: BloggerCourse.com  Fact  Sheet for Healthcare Providers: SeriousBroker.it  This test is not yet approved or cleared by the [place] FDA and has been authorized for detection and/or diagnosis of SARS-CoV-2 by FDA under an Emergency Use Authorization (EUA). This EUA will remain in effect (meaning this test can be used) for the duration of the COVID-19 declaration under Section 564(b)(1) of the Act, 21 U.S.C. section 360bbb-3(b)(1), unless the authorization is terminated or revoked.     Resp Syncytial Virus by PCR NEGATIVE NEGATIVE    Comment: (NOTE) Fact Sheet for Patients: BloggerCourse.com  Fact Sheet for Healthcare Providers: SeriousBroker.it  This test is not yet approved or cleared by the [place] FDA and has been authorized for detection and/or diagnosis of SARS-CoV-2 by FDA under an Emergency Use Authorization (EUA). This EUA will remain in effect (meaning this test can be used) for the duration of the COVID-19 declaration under Section 564(b)(1) of the Act, 21 U.S.C. section 360bbb-3(b)(1), unless the authorization is terminated or revoked.  Performed at [facility], [address] [place], [place] [address]   C-reactive protein     Status: None   Collection Time: 03/26/23  9:35 PM  Result Value Ref Range   CRP <0.5 <1.0 mg/dL  Comment: Performed at [facility], [address], [place], [place] [address]  Sedimentation rate     Status: None   Collection Time: 03/26/23  9:35 PM  Result Value Ref Range   Sed Rate 10 0 - 20 mm/hr    Comment: Performed at [facility], [address] [place], [place] [address]  Brain natriuretic peptide     Status: None   Collection Time: 03/26/23  9:35 PM  Result Value Ref Range   B Natriuretic Peptide 92.6 0.0 - 100.0 pg/mL    Comment: Performed at [facility], [address], [place], [place] [address]  Surgical PCR screen      Status: None   Collection Time: 03/27/23  1:15 AM   Specimen: Nasal Mucosa; Nasal Swab  Result Value Ref Range   MRSA, PCR NEGATIVE NEGATIVE   Staphylococcus aureus NEGATIVE NEGATIVE    Comment: (NOTE) The Xpert SA Assay (FDA approved for NASAL specimens in patients 44 years of age and older), is one component of a comprehensive surveillance program. It is not intended to diagnose infection nor to guide or monitor treatment. Performed at [facility], [address] [place], [place] [address]   Basic metabolic panel     Status: Abnormal   Collection Time: 03/27/23  6:11 AM  Result Value Ref Range   Sodium 141 135 - 145 mmol/L   Potassium 3.7 3.5 - 5.1 mmol/L   Chloride 111 98 - 111 mmol/L   CO2 20 (L) 22 - 32 mmol/L   Glucose, Bld 72 70 - 99 mg/dL    Comment: Glucose reference range applies only to samples taken after fasting for at least 8 hours.   BUN 30 (H) 8 - 23 mg/dL   Creatinine, Ser 9.62 (H) 0.61 - 1.24 mg/dL   Calcium 8.1 (L) 8.9 - 10.3 mg/dL   GFR, Estimated 34 (L) >60 mL/min    Comment: (NOTE) Calculated using the CKD-EPI Creatinine Equation (2021)    Anion gap 10 5 - 15    Comment: Performed at [facility], [address] [place], [place] [address]  CBC     Status: Abnormal   Collection Time: 03/27/23  6:11 AM  Result Value Ref Range   WBC 6.6 4.0 - 10.5 K/uL   RBC 4.19 (L) 4.22 - 5.81 MIL/uL   Hemoglobin 11.6 (L) 13.0 - 17.0 g/dL   HCT 13.2 (L) 44.0 - 10.2 %   MCV 83.8 80.0 - 100.0 fL   MCH 27.7 26.0 - 34.0 pg   MCHC 33.0 30.0 - 36.0 g/dL   RDW 72.5 36.6 - 44.0 %   Platelets 195 150 - 400 K/uL   nRBC 0.0 0.0 - 0.2 %    Comment: Performed at [facility], [address] [place], [place] [address]   PERIPHERAL VASCULAR CATHETERIZATION  Result Date: 03/27/2023 See surgical note for result.   Blood pressure (!) 167/85, pulse 64, temperature 99 F (37.2 C), resp. rate 16, height 6\' 3"  (1.905 m), weight 88.1 kg, SpO2  97%.  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 Neuropathy 3. PVD  Plan -Patient seen and examined. -MRI/x-ray imaging reviewed showing osteomyelitis to the L 5th MTPJ.  Wound probes to bone at 5th MTPJ left foot with associated cellulitis. -Appreciate vascular recs. -Appreciate med recs for Abx therapy. -All treatment options were discussed with the patient of both conservative and surgical attempts at correction including potential risks  and complications.  Patient has elected for procedure consisting of L partial 5th ray amputation.  No guarantees given.  Consent obtained. -Patient to be NPO at midnight tonight for surgery tomorrow morning at 0730.   Rosetta Posner, DPM 03/27/2023, 8:26 PM

## 2023-03-27 NOTE — Plan of Care (Signed)
  Problem: Education: Goal: Knowledge of General Education information will improve Description: Including pain rating scale, medication(s)/side effects and non-pharmacologic comfort measures Outcome: Progressing   Problem: Health Behavior/Discharge Planning: Goal: Ability to manage health-related needs will improve Outcome: Progressing   Problem: Clinical Measurements: Goal: Ability to maintain clinical measurements within normal limits will improve Outcome: Progressing   Problem: Activity: Goal: Risk for activity intolerance will decrease Outcome: Progressing   Problem: Nutrition: Goal: Adequate nutrition will be maintained Outcome: Progressing   Problem: Elimination: Goal: Will not experience complications related to bowel motility Outcome: Progressing   Problem: Pain Management: Goal: General experience of comfort will improve Outcome: Progressing

## 2023-03-27 NOTE — Op Note (Signed)
[facility]  Percutaneous Study/Intervention Procedural Note   Date of Surgery: 03/27/2023  Surgeon(s):Laurel Harnden    Assistants:none  Pre-operative Diagnosis: PAD with ulceration left lower extremity  Post-operative diagnosis:  Same  Procedure(s) Performed:             1.  Ultrasound guidance for vascular access right femoral artery             2.  Catheter placement into left common femoral artery from right femoral approach             3.  Aortogram and selective left lower extremity angiogram             4.  Percutaneous transluminal angioplasty of left external iliac artery with 8 mm diameter angioplasty balloon             5.  Percutaneous transluminal angioplasty of the left posterior tibial artery with 3 mm diameter by 22 cm length angioplasty balloon  6.  Percutaneous transluminal angioplasty of the left anterior tibial artery with 3 mm diameter by 22 cm length angioplasty balloon x 2  7.  Stent placement to the left external iliac artery with 14 mm diameter by 6 cm length life star stent             8.  StarClose closure device right femoral artery  EBL: 10 cc  Contrast: 65 cc  Fluoro Time: 8.2 minutes  Moderate Conscious Sedation Time: approximately 43 minutes using 1 mg of Versed and 50 mcg of Fentanyl              Indications:  Patient is a 89 y.o.male with nonhealing ulcerations on the left foot. The patient is brought in for angiography for further evaluation and potential treatment.  Due to the limb threatening nature of the situation, angiogram was performed for attempted limb salvage. The patient is aware that if the procedure fails, amputation would be expected.  The patient also understands that even with successful revascularization, amputation may still be required due to the severity of the situation.  Risks and benefits are discussed and informed consent is obtained.   Procedure:  The patient was identified and appropriate procedural  time out was performed.  The patient was then placed supine on the table and prepped and draped in the usual sterile fashion. Moderate conscious sedation was administered during a face to face encounter with the patient throughout the procedure with my supervision of the RN administering medicines and monitoring the patient's vital signs, pulse oximetry, telemetry and mental status throughout from the start of the procedure until the patient was taken to the recovery room. Ultrasound was used to evaluate the right common femoral artery.  It was patent .  A digital ultrasound image was acquired.  A Seldinger needle was used to access the right common femoral artery under direct ultrasound guidance and a permanent image was performed.  A 0.035 J wire was advanced without resistance and a 5Fr sheath was placed.  Pigtail catheter was placed into the aorta and an AP aortogram was performed. This demonstrated normal renal arteries and a normal aorta.  The right iliac system was patent without any significant stenosis.  The left proximal external iliac artery had a short segment stenosis of about 70% that was highly calcific.  There was also marked tortuosity within the iliac vessels. I then crossed the aortic bifurcation and advanced to the left femoral head.  This was actually difficult due to the  iliac stenosis and the pigtail catheter would not go and I had to exchange for a Kumpe catheter and this went tediously.  Selective left lower extremity angiogram was then performed. This demonstrated fairly normal common femoral artery, profunda femoris artery, superficial femoral and popliteal arteries.  The popliteal artery was difficult to opacify due to the knee prosthesis and steep obliques with cranial and caudal projection were used to visualize the entire popliteal artery.  There was then a normal tibial trifurcation but significant tibial disease.  The posterior tibial artery had several areas of greater than 70%  narrowing in the proximal to mid segment.  The anterior tibial artery had at least 5 different narrowings of at least 70% in the proximal, mid, and distal segments.  The peroneal artery was small but without obvious focal stenosis. It was felt that it was in the patient's best interest to proceed with intervention after these images to avoid a second procedure and a larger amount of contrast and fluoroscopy based off of the findings from the initial angiogram. The patient was systemically heparinized and initially I performed angioplasty of the left external iliac artery with an 8 mm diameter by 4 cm length angioplasty balloon.  A waist was seen but there was still greater than 50% residual stenosis.  I decided that I would stent this after performing the tibial intervention.  A 6 French 70 cm sheath was then placed over the Terumo Advantage wire. I then used a Kumpe catheter and the advantage wire to get into the posterior tibial artery first and cross the stenosis in the proximal to mid segment.  Angioplasty was then performed with a 3 mm diameter by 22 cm length angioplasty balloon inflated to 10 atm for 1 minute in the left posterior tibial artery.  Completion imaging showed significant improvement with less than 30% residual stenosis.  I then used the advantage wire and Kumpe catheter to get into the anterior tibial artery and crossed the multiple areas of stenosis all the way down to the distal anterior tibial artery.  2 inflations with a 3 mm diameter by 22 cm length angioplasty balloon were then used to treat the anterior tibial artery from just above the ankle all the way up to its origin.  Each inflation was 8 to 10 atm for 1 minute.  Completion imaging showed significant improvement with no areas that appear to have more than 30% residual stenosis in the anterior tibial artery.  I then pulled the sheath back to the aortic bifurcation to address the left external iliac artery stenosis.  This was best seen  in an RAO projection.  He had very large iliac vessels.  A 14 mm diameter by 6 cm length life star stent was then selected and deployed in the left external iliac artery bridging across the hypogastric artery and into the distal common iliac artery.  This was postdilated with a 12 mm balloon with excellent angiographic completion result and less than 10% residual stenosis. I elected to terminate the procedure. The sheath was removed and StarClose closure device was deployed in the right femoral artery with excellent hemostatic result. The patient was taken to the recovery room in stable condition having tolerated the procedure well.  Findings:               Aortogram:  This demonstrated normal renal arteries and a normal aorta.  The right iliac system was patent without any significant stenosis.  The left proximal external iliac artery had a  short segment stenosis of about 70% that was highly calcific.  There was also marked tortuosity within the iliac vessels.             Left lower Extremity:  This demonstrated fairly normal common femoral artery, profunda femoris artery, superficial femoral and popliteal arteries.  The popliteal artery was difficult to opacify due to the knee prosthesis and steep obliques with cranial and caudal projection were used to visualize the entire popliteal artery.  There was then a normal tibial trifurcation but significant tibial disease.  The posterior tibial artery had several areas of greater than 70% narrowing in the proximal to mid segment.  The anterior tibial artery had at least 5 different narrowings of at least 70% in the proximal, mid, and distal segments.  The peroneal artery was small but without obvious focal stenosis.   Disposition: Patient was taken to the recovery room in stable condition having tolerated the procedure well.  Complications: None  Festus Barren 03/27/2023 12:46 PM   This note was created with Dragon Medical transcription system. Any errors in  dictation are purely unintentional.

## 2023-03-27 NOTE — Progress Notes (Signed)
Progress Note   Patient: Michael Holloway UUV:[id number] DOB: 03-24-1929 DOA: 03/26/2023     1 DOS: the patient was seen and examined on 03/27/2023   Brief hospital course: Michael Holloway is a 89 y.o. male with medical history significant of  bladder cancer (s/p of TURBT, s/p of BCG), HTN, HLD, SSS (s/p of PPM), sCHF with EF 45%, gout, CKD-3B, peripheral neuropathy, who presents with left foot ulcer. He is referred to ED by podiatry office after he was found to have osteomyelitis on MRI left foot.   Assessment and Plan: Osteomyelitis of left foot [facility]):  Patient is stable vitally. Continue Vancomycin and Rocephin therapy. S/p Aortogram and selective left lower extremity angiogram by vascular team, left external stent placement. Dr. Excell Seltzer of podiatry will see the patient for possible L partial 5th ray amputation.  Blood cultures x 2 pending. ESR and CRP normal.   Chronic combined systolic and diastolic CHF (congestive heart failure) (HCC): 2D echo 02/15/2021 showed EF of 45 with grade 1 diastolic dysfunction.  Patient does not have leg edema or JVD.  No shortness of breath.  CHF is compensated. He is euvolemic.   Essential hypertension:  BP stable. IV hydralazine as needed   Stage 3b chronic kidney disease (HCC):  Kidney function stable. Monitor daily renal function. Avoid nephrotoxic drugs.   Malignant neoplasm of overlapping sites of bladder [facility]): Has s/p of TURBT and BCG treatment Follow-up with Dr. Jimmey Ralph for urology    Out of bed to chair. Incentive spirometry. Nursing supportive care. Fall, aspiration precautions. DVT prophylaxis   Code Status: Full Code Subjective: Patient is seen and examined today after vascular procedure. He is lying in bed, son at bedside. Denies any complaints. Asks for podiatrist.  Physical Exam: Vitals:   03/27/23 1300 03/27/23 1315 03/27/23 1343 03/27/23 1658  BP: (!) 143/74 (!) 144/72 (!) 149/77 (!) 127/56  Pulse: 67 60 70 60  Resp: (!)  22 12 18 16   Temp:   98 F (36.7 C) 98.1 F (36.7 C)  TempSrc:      SpO2: 100% 97% 100% 100%  Weight:      Height:        General - Elderly Caucasian male, no apparent distress HEENT - PERRLA, EOMI, atraumatic head, non tender sinuses. Lung - Clear, no rales, rhonchi, wheezes. Heart - S1, S2 heard, no murmurs, rubs, trace pedal edema. Abdomen - Soft, non tender, bowel sounds good Neuro - Alert, awake and oriented x 3, non focal exam. Skin - Warm and dry. Left 5th foot joint swelling, redness noted.  Data Reviewed:      Latest Ref Rng & Units 03/27/2023    6:11 AM 03/26/2023   12:19 PM 03/13/2023   10:04 AM  CBC  WBC 4.0 - 10.5 K/uL 6.6  7.8  13.0   Hemoglobin 13.0 - 17.0 g/dL 47.4  25.9  56.3   Hematocrit 39.0 - 52.0 % 35.1  44.0  43.6   Platelets 150 - 400 K/uL 195  231  324       Latest Ref Rng & Units 03/27/2023    6:11 AM 03/26/2023   12:19 PM 03/13/2023   10:04 AM  BMP  Glucose 70 - 99 mg/dL 72  875  643   BUN 8 - 23 mg/dL 30  31  53   Creatinine 0.61 - 1.24 mg/dL 3.29  5.18  8.41   Sodium 135 - 145 mmol/L 141  141  139   Potassium 3.5 -  5.1 mmol/L 3.7  4.0  4.1   Chloride 98 - 111 mmol/L 111  107  106   CO2 22 - 32 mmol/L 20  24  22    Calcium 8.9 - 10.3 mg/dL 8.1  8.7  9.1    PERIPHERAL VASCULAR CATHETERIZATION  Result Date: 03/27/2023 See surgical note for result.    Family Communication: Discussed with patient, son at bedside they understand and agree. All questions answereed.    Disposition: Status is: Inpatient Remains inpatient appropriate because: podiatry intervention.  Planned Discharge Destination: Home with Home Health     Time spent: 39 minutes  Author: Marcelino Duster, MD 03/27/2023 5:18 PM Secure chat 7am to 7pm For on call review [email or web address].

## 2023-03-27 NOTE — Plan of Care (Signed)
  Problem: Clinical Measurements: Goal: Ability to maintain clinical measurements within normal limits will improve Outcome: Progressing   

## 2023-03-27 NOTE — Progress Notes (Signed)
Pharmacy Antibiotic Note  Michael Holloway is a 89 y.o. male admitted on 03/26/2023 with L foot osteomyelitis.  Pharmacy has been consulted for vancomycin dosing. Serum creatinine is noted to be elevated but appears near apparent baseline level  Plan:  Adjust vancomycin IV 1000 mg every 24 hours Goal AUC 400-550 Estimated AUC 533.7, Cmin 15.7 Used Scr 1.86, IBW, Vd 0.72  Also on ceftriaxone 1 gram every 24 hours   Height: 6\' 3"  (190.5 cm) Weight: 88.1 kg (194 lb 3.6 oz) IBW/kg (Calculated) : 84.5  Temp (24hrs), Avg:98 F (36.7 C), Min:97.8 F (36.6 C), Max:98.4 F (36.9 C)  Recent Labs  Lab 03/26/23 1219 03/27/23 0611  WBC 7.8 6.6  CREATININE 1.92*  --   LATICACIDVEN 1.1  --     Estimated Creatinine Clearance: 29.3 mL/min (A) (by C-G formula based on SCr of 1.92 mg/dL (H)).    Allergies  Allergen Reactions   Sulfa Antibiotics Rash    Antimicrobials this admission: 11/14 ceftriaxone >>  11/14 vancomycin >>   Microbiology results: 11/14 BCx: NG < 12 hours  Thank you for allowing pharmacy to be a part of this patient's care.  Elliot Gurney, PharmD, BCPS Clinical Pharmacist  03/27/2023 9:29 AM

## 2023-03-28 ENCOUNTER — Inpatient Hospital Stay: Payer: Medicare Other

## 2023-03-28 ENCOUNTER — Inpatient Hospital Stay: Payer: Medicare Other | Admitting: Urgent Care

## 2023-03-28 ENCOUNTER — Encounter
Admission: EM | Disposition: A | Discharge status: Home / Self Care | Payer: Self-pay | Source: Home / Self Care | Attending: Internal Medicine

## 2023-03-28 DIAGNOSIS — I10 Essential (primary) hypertension: Secondary | ICD-10-CM | POA: Diagnosis not present

## 2023-03-28 DIAGNOSIS — N1832 Chronic kidney disease, stage 3b: Secondary | ICD-10-CM | POA: Diagnosis not present

## 2023-03-28 DIAGNOSIS — M86172 Other acute osteomyelitis, left ankle and foot: Secondary | ICD-10-CM | POA: Diagnosis not present

## 2023-03-28 DIAGNOSIS — I5042 Chronic combined systolic (congestive) and diastolic (congestive) heart failure: Secondary | ICD-10-CM | POA: Diagnosis not present

## 2023-03-28 HISTORY — PX: AMPUTATION: SHX166

## 2023-03-28 LAB — CREATININE, SERUM
Creatinine, Ser: 1.87 mg/dL — ABNORMAL HIGH (ref 0.61–1.24)
GFR, Estimated: 33 mL/min — ABNORMAL LOW (ref >60)

## 2023-03-28 SURGERY — AMPUTATION, FOOT, RAY
Anesthesia: General | Laterality: Left

## 2023-03-28 MED ORDER — FENTANYL CITRATE (PF) 100 MCG/2ML IJ SOLN: 25.0000 ug | INTRAMUSCULAR | Status: DC | PRN

## 2023-03-28 MED ORDER — AMOXICILLIN-POT CLAVULANATE 250-125 MG PO TABS
1.0000 | ORAL_TABLET | Freq: Two times a day (BID) | ORAL | Status: DC
  Administered 2023-03-28 – 2023-03-29 (×2): 1 via ORAL
  Filled 2023-03-28 (×2): qty 1

## 2023-03-28 MED ORDER — PROPOFOL 500 MG/50ML IV EMUL
INTRAVENOUS | Status: DC | PRN
  Administered 2023-03-28: 50 ug/kg/min via INTRAVENOUS

## 2023-03-28 MED ORDER — LIDOCAINE HCL (PF) 1 % IJ SOLN
INTRAMUSCULAR | Status: AC
  Filled 2023-03-28: qty 30

## 2023-03-28 MED ORDER — DOXYCYCLINE HYCLATE 100 MG PO TABS
100.0000 mg | ORAL_TABLET | Freq: Two times a day (BID) | ORAL | Status: DC
  Administered 2023-03-28 – 2023-03-29 (×2): 100 mg via ORAL
  Filled 2023-03-28 (×2): qty 1

## 2023-03-28 MED ORDER — OXYCODONE HCL 5 MG PO TABS: 5.0000 mg | ORAL_TABLET | Freq: Once | ORAL | Status: DC | PRN

## 2023-03-28 MED ORDER — BUPIVACAINE HCL (PF) 0.5 % IJ SOLN
INTRAMUSCULAR | Status: AC
  Filled 2023-03-28: qty 30

## 2023-03-28 MED ORDER — CEFAZOLIN SODIUM-DEXTROSE 2-4 GM/100ML-% IV SOLN
INTRAVENOUS | Status: AC
Start: 2023-03-28 — End: ?
  Filled 2023-03-28: qty 100

## 2023-03-28 MED ORDER — LACTATED RINGERS IV SOLN
INTRAVENOUS | Status: DC | PRN
Start: 2023-03-28 — End: 2023-03-28

## 2023-03-28 MED ORDER — FENTANYL CITRATE (PF) 100 MCG/2ML IJ SOLN
INTRAMUSCULAR | Status: AC
  Filled 2023-03-28: qty 2

## 2023-03-28 MED ORDER — PHENYLEPHRINE 80 MCG/ML (10ML) SYRINGE FOR IV PUSH (FOR BLOOD PRESSURE SUPPORT)
PREFILLED_SYRINGE | INTRAVENOUS | Status: DC | PRN
  Administered 2023-03-28 (×3): 80 ug via INTRAVENOUS

## 2023-03-28 MED ORDER — DEXMEDETOMIDINE HCL IN NACL 80 MCG/20ML IV SOLN
INTRAVENOUS | Status: DC | PRN
Start: 2023-03-28 — End: 2023-03-28
  Administered 2023-03-28: 4 ug via INTRAVENOUS

## 2023-03-28 MED ORDER — BUPIVACAINE HCL (PF) 0.5 % IJ SOLN
INTRAMUSCULAR | Status: DC | PRN
  Administered 2023-03-28: 10 mL

## 2023-03-28 MED ORDER — CEFAZOLIN SODIUM-DEXTROSE 2-4 GM/100ML-% IV SOLN
2.0000 g | INTRAVENOUS | Status: AC
  Administered 2023-03-28: 2 g via INTRAVENOUS

## 2023-03-28 MED ORDER — PROPOFOL 10 MG/ML IV BOLUS
INTRAVENOUS | Status: AC
  Filled 2023-03-28: qty 20

## 2023-03-28 MED ORDER — 0.9 % SODIUM CHLORIDE (POUR BTL) OPTIME
TOPICAL | Status: DC | PRN
  Administered 2023-03-28: 500 mL

## 2023-03-28 MED ORDER — FENTANYL CITRATE (PF) 100 MCG/2ML IJ SOLN
INTRAMUSCULAR | Status: DC | PRN
  Administered 2023-03-28: 25 ug via INTRAVENOUS

## 2023-03-28 MED ORDER — OXYCODONE HCL 5 MG/5ML PO SOLN: 5.0000 mg | Freq: Once | ORAL | Status: DC | PRN

## 2023-03-28 MED ORDER — PHENYLEPHRINE 80 MCG/ML (10ML) SYRINGE FOR IV PUSH (FOR BLOOD PRESSURE SUPPORT)
PREFILLED_SYRINGE | INTRAVENOUS | Status: AC
  Filled 2023-03-28: qty 10

## 2023-03-28 MED ORDER — POVIDONE-IODINE 7.5 % EX SOLN
Freq: Once | CUTANEOUS | Status: DC
  Filled 2023-03-28: qty 118

## 2023-03-28 SURGICAL SUPPLY — 49 items
BLADE OSCILLATING/SAGITTAL (BLADE) ×1
BLADE SURG 15 STRL LF DISP TIS (BLADE) ×4 IMPLANT
BLADE SURG 15 STRL SS (BLADE) ×2
BLADE SW THK.38XMED LNG THN (BLADE) ×2 IMPLANT
BNDG COHESIVE 4X5 TAN STRL LF (GAUZE/BANDAGES/DRESSINGS) ×2 IMPLANT
BNDG ELASTIC 4INX 5YD STR LF (GAUZE/BANDAGES/DRESSINGS) ×2 IMPLANT
BNDG ELASTIC 6INX 5YD STR LF (GAUZE/BANDAGES/DRESSINGS) ×2 IMPLANT
BNDG ESMARCH 4X12 STRL LF (GAUZE/BANDAGES/DRESSINGS) ×2 IMPLANT
BNDG GAUZE DERMACEA FLUFF 4 (GAUZE/BANDAGES/DRESSINGS) ×4 IMPLANT
BNDG STRETCH 4X75 STRL LF (GAUZE/BANDAGES/DRESSINGS) ×2 IMPLANT
CNTNR URN SCR LID CUP LEK RST (MISCELLANEOUS) IMPLANT
CONT SPEC 4OZ STRL OR WHT (MISCELLANEOUS) ×1
CUFF TOURN SGL QUICK 12 (TOURNIQUET CUFF) ×2 IMPLANT
DURAPREP 26ML APPLICATOR (WOUND CARE) ×2 IMPLANT
ELECT REM PT RETURN 9FT ADLT (ELECTROSURGICAL) ×1
ELECTRODE REM PT RTRN 9FT ADLT (ELECTROSURGICAL) ×2 IMPLANT
GAUZE SPONGE 4X4 12PLY STRL (GAUZE/BANDAGES/DRESSINGS) ×2 IMPLANT
GAUZE XEROFORM 1X8 LF (GAUZE/BANDAGES/DRESSINGS) ×4 IMPLANT
GLOVE BIO SURGEON STRL SZ7 (GLOVE) ×2 IMPLANT
GLOVE INDICATOR 7.0 STRL GRN (GLOVE) ×2 IMPLANT
GLOVE INDICATOR 7.5 STRL GRN (GLOVE) ×2 IMPLANT
GOWN STRL REUS W/ TWL LRG LVL3 (GOWN DISPOSABLE) ×4 IMPLANT
GOWN STRL REUS W/TWL LRG LVL3 (GOWN DISPOSABLE) ×2
HANDLE YANKAUER SUCT BULB TIP (MISCELLANEOUS) IMPLANT
KIT TURNOVER KIT A (KITS) ×2 IMPLANT
MANIFOLD NEPTUNE II (INSTRUMENTS) ×2 IMPLANT
NDL HYPO 25X1 1.5 SAFETY (NEEDLE) ×4 IMPLANT
NDL SAFETY ECLIPSE 18X1.5 (NEEDLE) ×2 IMPLANT
NEEDLE HYPO 25X1 1.5 SAFETY (NEEDLE) ×2 IMPLANT
NS IRRIG 500ML POUR BTL (IV SOLUTION) ×2 IMPLANT
PACK EXTREMITY ARMC (MISCELLANEOUS) ×2 IMPLANT
PAD ABD DERMACEA PRESS 5X9 (GAUZE/BANDAGES/DRESSINGS) IMPLANT
SPONGE T-LAP 18X18 ~~LOC~~+RFID (SPONGE) ×2 IMPLANT
STAPLER SKIN PROX 35W (STAPLE) ×2 IMPLANT
STOCKINETTE M/LG 89821 (MISCELLANEOUS) ×2 IMPLANT
STRIP CLOSURE SKIN 1/4X4 (GAUZE/BANDAGES/DRESSINGS) ×2 IMPLANT
SUT ETHILON 2 0 FS 18 (SUTURE) ×4 IMPLANT
SUT ETHILON 2 0 FSLX (SUTURE) ×2 IMPLANT
SUT ETHILON 3-0 FS-10 30 BLK (SUTURE) ×1
SUT VIC AB 2-0 CT1 27 (SUTURE)
SUT VIC AB 2-0 CT1 TAPERPNT 27 (SUTURE) ×2 IMPLANT
SUT VIC AB 2-0 SH 27 (SUTURE)
SUT VIC AB 2-0 SH 27XBRD (SUTURE) ×2 IMPLANT
SUT VIC AB 3-0 SH 27 (SUTURE) ×1
SUT VIC AB 3-0 SH 27X BRD (SUTURE) ×4 IMPLANT
SUTURE EHLN 3-0 FS-10 30 BLK (SUTURE) ×4 IMPLANT
SYR 10ML LL (SYRINGE) ×4 IMPLANT
TRAP FLUID SMOKE EVACUATOR (MISCELLANEOUS) ×2 IMPLANT
WATER STERILE IRR 500ML POUR (IV SOLUTION) ×2 IMPLANT

## 2023-03-28 NOTE — Anesthesia Procedure Notes (Signed)
Procedure Name: MAC Date/Time: 03/28/2023 7:40 AM  Performed by: Elmarie Mainland, CRNAPre-anesthesia Checklist: Patient identified, Emergency Drugs available, Suction available and Patient being monitored Patient Re-evaluated:Patient Re-evaluated prior to induction Oxygen Delivery Method: Simple face mask

## 2023-03-28 NOTE — Transfer of Care (Signed)
Immediate Anesthesia Transfer of Care Note  Patient: Michael Holloway  Procedure(s) Performed: PARTIAL 5TH RAY AMPUTATION (Left)  Patient Location: PACU  Anesthesia Type:General  Level of Consciousness: awake, alert , and oriented  Airway & Oxygen Therapy: Patient Spontanous Breathing  Post-op Assessment: Report given to RN and Post -op Vital signs reviewed and stable  Post vital signs: Reviewed and stable  Last Vitals:  Vitals Value Taken Time  BP 144/87 03/28/23 0835  Temp    Pulse 64 03/28/23 0837  Resp 17 03/28/23 0837  SpO2 99 % 03/28/23 0837  Vitals shown include unfiled device data.  Last Pain:  Vitals:   03/28/23 0329  TempSrc: Oral  PainSc:          Complications: No notable events documented.

## 2023-03-28 NOTE — Progress Notes (Signed)
Progress Note   Patient: Michael Holloway ONG:[id number] DOB: 10-Mar-1929 DOA: 03/26/2023     2 DOS: the patient was seen and examined on 03/28/2023   Brief hospital course: FERDIE SLANINA is a 89 y.o. male with medical history significant of  bladder cancer (s/p of TURBT, s/p of BCG), HTN, HLD, SSS (s/p of PPM), sCHF with EF 45%, gout, CKD-3B, peripheral neuropathy, who presents with left foot ulcer. He is referred to ED by podiatry office after he was found to have osteomyelitis on MRI left foot.   Assessment and Plan: Osteomyelitis of left foot (HCC):  S/p Aortogram and selective left lower extremity angiogram by vascular team, left external stent placement. S/p L partial 5th ray amputation. He tolerated the procedure. Blood cultures x 2 pending. Wound cultures pending. Podiatry team advised Augmentin, doxy therapy for 10 days. PT to work with him today. Possible dc tomorrow.   Chronic combined systolic and diastolic CHF (congestive heart failure) (HCC): 2D echo 02/15/2021 showed EF of 45 with grade 1 diastolic dysfunction.  Patient does not have leg edema or JVD.  No shortness of breath.  CHF is compensated. He is euvolemic.   Essential hypertension:  BP stable. IV hydralazine as needed   Stage 3b chronic kidney disease (HCC):  Kidney function stable. Monitor daily renal function. Avoid nephrotoxic drugs.   Malignant neoplasm of overlapping sites of bladder [facility]): Has s/p of TURBT and BCG treatment Follow-up with urologist as outpatient.    Out of bed to chair. Incentive spirometry. Nursing supportive care. Fall, aspiration precautions. DVT prophylaxis   Code Status: Full Code  Subjective: Patient is seen and examined today after podiatry procedure. He is lying in bed. Denies any complaints of pain.  Physical Exam: Vitals:   03/28/23 0845 03/28/23 0900 03/28/23 0927 03/28/23 1622  BP: 130/66 138/64 (!) 160/77 (!) 151/87  Pulse: 62 (!) 58 64 79  Resp: 20 13 17 16    Temp:   98.1 F (36.7 C) 98.5 F (36.9 C)  TempSrc:   Oral Oral  SpO2: 98% 97% 100% 98%  Weight:      Height:        General - Elderly Caucasian male, no apparent distress HEENT - PERRLA, EOMI, atraumatic head, non tender sinuses. Lung - Clear, no rales, rhonchi, wheezes. Heart - S1, S2 heard, no murmurs, rubs, trace pedal edema. Abdomen - Soft, non tender, bowel sounds good Neuro - Alert, awake and oriented x 3, non focal exam. Skin - Warm and dry. Left foot dressing, boot noted.  Data Reviewed:      Latest Ref Rng & Units 03/27/2023    6:11 AM 03/26/2023   12:19 PM 03/13/2023   10:04 AM  CBC  WBC 4.0 - 10.5 K/uL 6.6  7.8  13.0   Hemoglobin 13.0 - 17.0 g/dL 44.0  10.2  72.5   Hematocrit 39.0 - 52.0 % 35.1  44.0  43.6   Platelets 150 - 400 K/uL 195  231  324       Latest Ref Rng & Units 03/28/2023    4:03 AM 03/27/2023    6:11 AM 03/26/2023   12:19 PM  BMP  Glucose 70 - 99 mg/dL  72  366   BUN 8 - 23 mg/dL  30  31   Creatinine 4.40 - 1.24 mg/dL 3.47  4.25  9.56   Sodium 135 - 145 mmol/L  141  141   Potassium 3.5 - 5.1 mmol/L  3.7  4.0  Chloride 98 - 111 mmol/L  111  107   CO2 22 - 32 mmol/L  20  24   Calcium 8.9 - 10.3 mg/dL  8.1  8.7    DG Foot Complete Left  Result Date: 03/28/2023 CLINICAL DATA:  Left foot amputation EXAM: LEFT FOOT - COMPLETE 3+ VIEW COMPARISON:  03/24/2023 FINDINGS: Interval transmetatarsal amputation of the left fifth ray. Resection margin is sharp. Remaining bony structures are intact. No fracture or dislocation. No erosive changes. Degenerative changes are most notable at the first MTP joint and within the dorsal aspect of the midfoot. Small plantar calcaneal spur. Expected postoperative changes adjacent to the residual fifth metatarsal. IMPRESSION: Interval transmetatarsal amputation of the left fifth ray. No acute findings. Electronically Signed   By: Duanne Guess D.O.   On: 03/28/2023 12:45   PERIPHERAL VASCULAR  CATHETERIZATION  Result Date: 03/27/2023 See surgical note for result.    Family Communication: Discussed with patient, he understand and agree. All questions answereed.  Disposition: Status is: Inpatient Remains inpatient appropriate because: podiatry intervention.  Planned Discharge Destination: Home with Home Health    Time spent: 38 minutes  Author: Marcelino Duster, MD 03/28/2023 7:16 PM Secure chat 7am to 7pm For on call review [email or web address].

## 2023-03-28 NOTE — Anesthesia Preprocedure Evaluation (Signed)
Anesthesia Evaluation  Patient identified by MRN, date of birth, ID band Patient awake    Reviewed: Allergy & Precautions, NPO status , Patient's Chart, lab work & pertinent test results  History of Anesthesia Complications Negative for: history of anesthetic complications  Airway Mallampati: III  TM Distance: >3 FB Neck ROM: full    Dental  (+) Poor Dentition, Chipped, Missing   Pulmonary neg shortness of breath, former smoker   Pulmonary exam normal        Cardiovascular Exercise Tolerance: Good hypertension, +CHF  + dysrhythmias Atrial Fibrillation + pacemaker      Neuro/Psych  Neuromuscular disease  negative psych ROS   GI/Hepatic Neg liver ROS,GERD  Controlled,,  Endo/Other  negative endocrine ROS    Renal/GU Renal disease  negative genitourinary   Musculoskeletal   Abdominal   Peds  Hematology negative hematology ROS (+)   Anesthesia Other Findings Past Medical History: No date: Allergy No date: Arthritis No date: Bladder cancer (HCC) No date: Cancer (HCC)     Comment:  Basal Cell Skin Cancer No date: Cataract No date: Chronic kidney disease     Comment:  Stage 3 CKD No date: Dysrhythmia No date: GERD (gastroesophageal reflux disease) No date: Gout No date: History of kidney stones No date: Hypertension 05/24/2020: Hypotension No date: Neuropathy 2020: Presence of permanent cardiac pacemaker No date: Spinal stenosis  Past Surgical History: No date: BACK SURGERY No date: CERVICAL SPINE SURGERY     Comment:   X 2 No date: CHOLECYSTECTOMY 08/25/2016: CYSTOSCOPY W/ RETROGRADES; Bilateral     Comment:  Procedure: CYSTOSCOPY WITH RETROGRADE PYELOGRAM;                Surgeon: Vanna Scotland, MD;  Location: [facility];                Service: Urology;  Laterality: Bilateral; 07/18/2019: CYSTOSCOPY W/ RETROGRADES; Bilateral     Comment:  Procedure: CYSTOSCOPY WITH RETROGRADE PYELOGRAM;                 Surgeon: Vanna Scotland, MD;  Location: [facility];                Service: Urology;  Laterality: Bilateral; 03/26/2020: CYSTOSCOPY W/ RETROGRADES; Bilateral     Comment:  Procedure: CYSTOSCOPY WITH RETROGRADE PYELOGRAM;                Surgeon: Vanna Scotland, MD;  Location: [facility];                Service: Urology;  Laterality: Bilateral; No date: EYE SURGERY; Bilateral     Comment:  Cataract Extraction with IOL No date: JOINT REPLACEMENT; Bilateral     Comment:  total knee replacements 06/03/2017: LOOP RECORDER INSERTION; N/A     Comment:  Procedure: LOOP RECORDER INSERTION;  Surgeon: Marcina Millard, MD;  Location: [facility];  Service:              Cardiovascular;  Laterality: N/A; 03/27/2023: LOWER EXTREMITY ANGIOGRAPHY; Left     Comment:  Procedure: Lower Extremity Angiography;  Surgeon: Annice Needy, MD;  Location: [facility];  Service:               Cardiovascular;  Laterality: Left; No date: LUMBAR LAMINECTOMY     Comment:   X 4  08/03/2018: PACEMAKER INSERTION; Left     Comment:  Procedure: INSERTION PACEMAKER DUALCHAMBER;  Surgeon:               Marcina Millard, MD;  Location: [facility];  Service:               Cardiovascular;  Laterality: Left; 1990s: PROSTATE SURGERY     Comment:  Dr. Sheppard Penton ,in office procedure No date: TONSILLECTOMY 09/29/2019: TOTAL KNEE ARTHROPLASTY; Right     Comment:  Procedure: TOTAL KNEE ARTHROPLASTY;  Surgeon: Christena Flake, MD;  Location: [facility];  Service: Orthopedics;                Laterality: Right; 08/25/2016: TRANSURETHRAL RESECTION OF BLADDER TUMOR WITH MITOMYCIN-C;  N/A     Comment:  Procedure: TRANSURETHRAL RESECTION OF BLADDER TUMOR WITH              MITOMYCIN-C;  Surgeon: Vanna Scotland, MD;  Location:               [facility];  Service: Urology;  Laterality: N/A; 07/18/2019: TRANSURETHRAL RESECTION OF BLADDER TUMOR WITH MITOMYCIN-C;  N/A     Comment:  Procedure:  TRANSURETHRAL RESECTION OF BLADDER TUMOR WITH              gemcitabine;  Surgeon: Vanna Scotland, MD;  Location:               [facility];  Service: Urology;  Laterality: N/A; 03/26/2020: TRANSURETHRAL RESECTION OF BLADDER TUMOR WITH MITOMYCIN- C; N/A     Comment:  Procedure: TRANSURETHRAL RESECTION OF BLADDER TUMOR WITH              gemcitabine;  Surgeon: Vanna Scotland, MD;  Location:               [facility];  Service: Urology;  Laterality: N/A; 06/14/2018: TRANSURETHRAL RESECTION OF PROSTATE; N/A     Comment:  Procedure: TRANSURETHRAL RESECTION OF THE PROSTATE               (TURP) with Gemcitabine;  Surgeon: Vanna Scotland, MD;                Location: [facility];  Service: Urology;  Laterality: N/A;  BMI    Body Mass Index: 24.28 kg/m      Reproductive/Obstetrics negative OB ROS                             Anesthesia Physical Anesthesia Plan  ASA: 3  Anesthesia Plan: General   Post-op Pain Management:    Induction: Intravenous  PONV Risk Score and Plan: Propofol infusion and TIVA  Airway Management Planned: Natural Airway and Nasal Cannula  Additional Equipment:   Intra-op Plan:   Post-operative Plan:   Informed Consent: I have reviewed the patients History and Physical, chart, labs and discussed the procedure including the risks, benefits and alternatives for the proposed anesthesia with the patient or authorized representative who has indicated his/her understanding and acceptance.     Dental Advisory Given  Plan Discussed with: Anesthesiologist, CRNA and Surgeon  Anesthesia Plan Comments: (Patient consented for risks of anesthesia including but not limited to:  - adverse reactions to medications - risk of airway placement if required - damage to eyes, teeth, lips or other oral mucosa - nerve damage due to positioning  - sore throat or hoarseness -  Damage to heart, brain, nerves, lungs, other parts of body or loss of life  Patient  voiced understanding and assent.)       Anesthesia Quick Evaluation

## 2023-03-28 NOTE — Evaluation (Signed)
Physical Therapy Evaluation Patient Details Name: Michael Holloway MRN: [id number] DOB: [birth year] Today's Date: 03/28/2023  History of Present Illness  Pt is a 89 yo male s/p partial 5th ray amputation (Left) per podiatry partial weight bearing with heel contact in post op shoe for short distances. PMH of bladder cancer, CKD, GERD, HTN, pacemaker, CHF, neuropathy, gout.   Clinical Impression  Patient A&Ox4, denied pain. Stated at baseline he recently has been using RW due to some weakness after a medication change, was using a SPC prior. Lives with his son who at baseline assists with stairs, pt reported being independent for ADLs. He was independent for bed mobility, modI for transfers and ambulated ~45ft with RW and supervision. Pt educated on weight bearing status, RW use, and post op shoe wearing. Pt in bed with foot elevated. Bandage noted for drainage, RN notified.  Overall the patient demonstrated deficits (see "PT Problem List") that impede the patient's functional abilities, safety, and mobility and would benefit from skilled PT intervention.          If plan is discharge home, recommend the following: Assistance with cooking/housework;Assist for transportation;Help with stairs or ramp for entrance   Can travel by private vehicle        Equipment Recommendations None recommended by PT  Recommendations for Other Services       Functional Status Assessment Patient has had a recent decline in their functional status and demonstrates the ability to make significant improvements in function in a reasonable and predictable amount of time.     Precautions / Restrictions Precautions Precautions: Fall;Other (comment) Precaution Comments: post op shoe Required Braces or Orthoses: Other Brace Other Brace: post op shoe Restrictions Weight Bearing Restrictions: Yes LLE Weight Bearing: Partial weight bearing Other Position/Activity Restrictions: per MD partial weight bearing in post op  shoe, through heel      Mobility  Bed Mobility Overal bed mobility: Independent                  Transfers Overall transfer level: Modified independent Equipment used: Rolling walker (2 wheels)                    Ambulation/Gait Ambulation/Gait assistance: Supervision Gait Distance (Feet): 30 Feet Assistive device: Rolling walker (2 wheels)         General Gait Details: cued for partial weight bearing in heel  Stairs            Wheelchair Mobility     Tilt Bed    Modified Rankin (Stroke Patients Only)       Balance Overall balance assessment: Needs assistance Sitting-balance support: Feet supported Sitting balance-Leahy Scale: Normal Sitting balance - Comments: able to don sandal     Standing balance-Leahy Scale: Good Standing balance comment: educated pt on RW positiongin to assist with off weighting foot                             Pertinent Vitals/Pain Pain Assessment Pain Assessment: No/denies pain    Home Living Family/patient expects to be discharged to:: Private residence Living Arrangements: Children Available Help at Discharge: Family Type of Home: House Home Access: Stairs to enter Entrance Stairs-Rails:  (he reaches for the door frame) Entrance Stairs-Number of Steps: 1 step x2   Home Layout: Two level;Able to live on main level with bedroom/bathroom Home Equipment: Rolling Walker (2 wheels);Cane - single point      Prior  Function Prior Level of Function : Independent/Modified Independent             Mobility Comments: recently using RW instead of SPC due to some medication changes and pt reported weakness ADLs Comments: independent     Extremity/Trunk Assessment   Upper Extremity Assessment Upper Extremity Assessment: Overall WFL for tasks assessed    Lower Extremity Assessment Lower Extremity Assessment: Overall WFL for tasks assessed    Cervical / Trunk Assessment Cervical / Trunk  Assessment: Normal  Communication      Cognition Arousal: Alert Behavior During Therapy: WFL for tasks assessed/performed Overall Cognitive Status: Within Functional Limits for tasks assessed                                          General Comments      Exercises     Assessment/Plan    PT Assessment Patient needs continued PT services  PT Problem List Decreased strength;Pain;Decreased range of motion;Decreased activity tolerance;Decreased balance;Decreased mobility;Decreased knowledge of precautions;Decreased knowledge of use of DME       PT Treatment Interventions DME instruction;Neuromuscular re-education;Gait training;Patient/family education;Stair training;Functional mobility training;Therapeutic activities;Balance training;Therapeutic exercise    PT Goals (Current goals can be found in the Care Plan section)  Acute Rehab PT Goals Patient Stated Goal: to go home PT Goal Formulation: With patient Time For Goal Achievement: 04/11/23 Potential to Achieve Goals: Good    Frequency Min 1X/week     Co-evaluation               AM-PAC PT "6 Clicks" Mobility  Outcome Measure Help needed turning from your back to your side while in a flat bed without using bedrails?: None Help needed moving from lying on your back to sitting on the side of a flat bed without using bedrails?: None Help needed moving to and from a bed to a chair (including a wheelchair)?: None Help needed standing up from a chair using your arms (e.g., wheelchair or bedside chair)?: None Help needed to walk in hospital room?: None Help needed climbing 3-5 steps with a railing? : None 6 Click Score: 24    End of Session   Activity Tolerance: Patient tolerated treatment well Patient left: in bed;with call bell/phone within reach;with bed alarm set Nurse Communication: Mobility status;Other (comment) (L foot drainage) PT Visit Diagnosis: Other abnormalities of gait and mobility  (R26.89);Difficulty in walking, not elsewhere classified (R26.2);Muscle weakness (generalized) (M62.81);Pain Pain - Right/Left: Left Pain - part of body: Ankle and joints of foot    Time: 1440-1455 PT Time Calculation (min) (ACUTE ONLY): 15 min   Charges:   PT Evaluation $PT Eval Low Complexity: 1 Low PT Treatments $Therapeutic Activity: 8-22 mins PT General Charges $$ ACUTE PT VISIT: 1 Visit         Olga Coaster PT, DPT 3:26 PM,03/28/23

## 2023-03-28 NOTE — Discharge Instructions (Signed)
[facility] SUR[facility]ERY [facility]ENTER  POST OPERATIVE INSTRUCTIONS FOR DR. Ether Griffins AND DR. Marguerita Stapp [facility] CLINIC PODIATRY DEPARTMENT   Take your medication as prescribed.  Pain medication should be taken only as needed.  Take antibiotics as prescribed until gone.  Keep the dressing clean, dry and intact.  If dressing does become saturated or loose send may change dressing.  Remove dressing and dry foot.  Apply nonadherent gauze such as Xeroform or Adaptic to the incisional area followed by 4 x 4 gauze, ABD pad, Kerlix, Ace wrap.  Keep your foot elevated above the heart level for the first 48 hours.  Continue elevation thereafter to improve swelling.  Walking to the bathroom and brief periods of walking are acceptable, unless we have instructed you to be non-weight bearing.  Always wear your post-op shoe when walking.  You may be partial weightbearing with heel contact in postoperative shoe to the left foot for short distances.  Do not take a shower. Baths are permissible as long as the foot is kept out of the water.   Every hour you are awake:  Bend your knee 15 times. Flex foot 15 times Massage calf 15 times  Call [facility] [phone]) if any of the following problems occur: You develop a temperature or fever. The bandage becomes saturated with blood. Medication does not stop your pain. Injury of the foot occurs. Any symptoms of infection including redness, odor, or red streaks running from wound.

## 2023-03-28 NOTE — Op Note (Addendum)
PODIATRY / FOOT AND ANKLE SURGERY OPERATIVE REPORT    SURGEON: Rosetta Posner, DPM  PRE-OPERATIVE DIAGNOSIS:  1.  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  Cellulitis left foot 3.  Neuropathy 4.  PVD  POST-OPERATIVE DIAGNOSIS: Same  PROCEDURE(S): Left partial fifth ray amputation Left foot rotational skin flap closure less than 10 cm  HEMOSTASIS: Left ankle tourniquet, 22 minutes  ANESTHESIA: MAC  ESTIMATED BLOOD LOSS: 20 cc  FINDING(S): 1.  Osteomyelitis to the left fifth metatarsal phalangeal joint  PATHOLOGY/SPECIMEN(S): Left fifth metatarsal phalangeal joint wound culture, left fifth metatarsal bone culture, left fifth ray path specimen with proximal margin in purple ink  INDICATIONS:   Michael Holloway is a 89 y.o. male who presents with a nonhealing ulceration to the plantar aspect the left fifth metatarsal phalange joint.  Patient has been treated in outpatient clinic by Dr. Alberteen Spindle and Dr. Ether Griffins and was noted to have osteomyelitis on x-ray and MRI imaging.  Patient also appeared to have a wound that probes to bone.  Patient was sent to the emergency room for admission for amputation and for possible revascularization.  Revascularization procedure was performed yesterday successfully so patient presents today now for surgical intervention for his left foot infection.  All treatment options were discussed with the patient both conservative and surgical attempts at correction include potential risks and complications at this time patient is elected for surgical procedure consisting of left partial fifth ray amputation.  No guarantees given.  DESCRIPTION: After obtaining full informed written consent, the patient was brought back to the operating room and placed supine upon the operating table.  The patient received IV antibiotics prior to induction.  After obtaining adequate anesthesia, the patient was prepped and draped in the  standard fashion.  A left fifth ray block was performed with 20 cc of half percent Marcaine plain.  An incision was made starting around the lateral aspect of the fifth metatarsal midshaft extending over the metatarsal head and bracketing around the digit.  The incision was made in such a way to also excise the wound in which there was a 3-1 type of elliptical incision that was removed to resect the ulceration.  This caused a soft tissue void to the area but ellipsed the ulceration.  The ulcer preoperatively measured approximately 1 cm x 1 cm x 1 cm and probes directly to bone.  A wound culture was taken from this area and passed off the operative site.  There appeared to be a robust amount of bleeding and patient's pulses were palpable so at this time an Esmarch bandage was used to exsanguinate the left lower extremity the pneumatic ankle tourniquet was inflated.  Hemostasis was then well achieved with electrocauterization of any bleeding vessels that were present.  At this time an extensor tenotomy and capsulotomy was performed at the fifth metatarsal phalange joint level followed by release the collateral and suspensory ligaments as well as any connection to the plantar plate and flexor tendon.  The fifth toe was disarticulated and passed off the operative site.  Circumferential dissection was then continued around the fifth metatarsal to the level of the midshaft.  At this time the sagittal bone saw was then used to resect the fifth metatarsal at the distal shaft/midshaft level with the appropriate beveling.  The capital fragment was dissected free and passed off the operative site and marked with purple ink.  A bone culture was also taken from the fifth metatarsal head which  appeared to be soft consistent with early stage osteomyelitis.  This was all passed off to pathology and for culture.  The surgical site was flushed with copious amounts normal sterile saline.  The plantar plate was resected and passed  off in the operative site as well as any tendinous type and debris to the area including the extensor and flexor tendons.  The tissues appear to be fairly healthy and viable overall.  The pneumatic ankle tourniquet was deflated and a prompt hyperemic response was noted to the flaps.  Any further bleeding vessels were cauterized and hemostasis appeared to be well achieved.  There did appear to be some blistering present to the dorsal lateral flap that was created so this blister type tissue was debrided and appeared to be healthy and bleeding overall.  The surgical site was once again flushed with copious amounts normal sterile saline.  The deep structures then reapproximated well coapted with 3-0 Vicryl.  The rotational skin flap was then rotated and translated to cover the void that was present from the previous ulceration approximately which was resected which was approximately 2 cm.  While holding the flap in a rotated position 3-0 Vicryl subcutaneous closure was obtained which held the flap in good position overall.  Skin was then reapproximated well coapted with 3-0 nylon in a combination of simple and horizontal mattress type stitching.  Postoperative dressing was then applied consisting of Xeroform followed by 4 x 4 gauze, ABD, gauze roll, Ace wrap.  The patient tolerated the procedure and anesthesia well and was transferred to the recovery in vital sign stable vascular status appear to be intact all toes and flaps of the left foot.  Following.  Postoperative monitoring the patient be discharged back to the inpatient room with the appropriate orders, medications, and instructions.  Patient is to remain partial weightbearing with heel contact and postop shoe for short distances and keep dressing clean, dry, and intact until postoperative visit 1 at some point hopefully next week.  Physical therapy order has been placed.  Recommend sending home on broad-spectrum antibiotics including doxycycline and  Augmentin 7-day course.  If culture reports come back unusual will call and change patient's antibiotics and will also follow-up on path report which may take a few days.  Patient can likely be discharged tomorrow, will place postop instructions in chart.  Podiatry team to sign off on patient after completing PT and restarting blood thinner medications 24 hours after procedure.  COMPLICATIONS: None  CONDITION: Good, stable  Rosetta Posner, DPM

## 2023-03-28 NOTE — Anesthesia Postprocedure Evaluation (Signed)
Anesthesia Post Note  Patient: Michael Holloway  Procedure(s) Performed: PARTIAL 5TH RAY AMPUTATION (Left)  Patient location during evaluation: PACU Anesthesia Type: General Level of consciousness: awake and alert Pain management: pain level controlled Vital Signs Assessment: post-procedure vital signs reviewed and stable Respiratory status: spontaneous breathing, nonlabored ventilation, respiratory function stable and patient connected to nasal cannula oxygen Cardiovascular status: blood pressure returned to baseline and stable Postop Assessment: no apparent nausea or vomiting Anesthetic complications: no   No notable events documented.   Last Vitals:  Vitals:   03/28/23 0900 03/28/23 0927  BP: 138/64 (!) 160/77  Pulse: (!) 58 64  Resp: 13 17  Temp:  36.7 C  SpO2: 97% 100%    Last Pain:  Vitals:   03/28/23 0927  TempSrc: Oral  PainSc: 0-No pain                 Cleda Mccreedy Michael Holloway

## 2023-03-28 NOTE — Progress Notes (Signed)
Surgical dressing with moderate blood drainage that had seeped though ace wrap. Notified Podiatrist. Per surgeon, okay to reinforce with ABD and ace wrap. If it continues. Remove the dressing and apply xeroform to the incision, 4x4, ABD, kerlix, and ACE with compression.  Will report to incoming nurse.

## 2023-03-28 NOTE — H&P (Signed)
HISTORY AND PHYSICAL INTERVAL NOTE:  03/28/2023  7:14 AM  Michael Holloway  has presented today for surgery, with the diagnosis of PAD, osteomyelitis left 5th MTPJ with associated cellulitis and neuropathic ulceration.  The various methods of treatment have been discussed with the patient.  No guarantees were given.  After consideration of risks, benefits and other options for treatment, the patient has consented to surgery.  I have reviewed the patients' chart and labs.     A history and physical examination was performed in the hospital.  The patient was reexamined.  There have been no changes to this history and physical examination.  Rosetta Posner, DPM

## 2023-03-28 NOTE — Plan of Care (Signed)

## 2023-03-29 ENCOUNTER — Encounter: Payer: Self-pay | Admitting: Podiatry

## 2023-03-29 DIAGNOSIS — N1832 Chronic kidney disease, stage 3b: Secondary | ICD-10-CM | POA: Diagnosis not present

## 2023-03-29 DIAGNOSIS — I10 Essential (primary) hypertension: Secondary | ICD-10-CM | POA: Diagnosis not present

## 2023-03-29 DIAGNOSIS — M86172 Other acute osteomyelitis, left ankle and foot: Secondary | ICD-10-CM | POA: Diagnosis not present

## 2023-03-29 DIAGNOSIS — I5042 Chronic combined systolic (congestive) and diastolic (congestive) heart failure: Secondary | ICD-10-CM | POA: Diagnosis not present

## 2023-03-29 DIAGNOSIS — Z95820 Peripheral vascular angioplasty status with implants and grafts: Secondary | ICD-10-CM

## 2023-03-29 LAB — CREATININE, SERUM
Creatinine, Ser: 1.67 mg/dL — ABNORMAL HIGH (ref 0.61–1.24)
GFR, Estimated: 38 mL/min — ABNORMAL LOW (ref >60)

## 2023-03-29 MED ORDER — CLOPIDOGREL BISULFATE 75 MG PO TABS: 75.0000 mg | ORAL_TABLET | Freq: Every day | ORAL | 3 refills | Status: DC

## 2023-03-29 MED ORDER — AMOXICILLIN-POT CLAVULANATE 250-125 MG PO TABS: 1.0000 | ORAL_TABLET | Freq: Two times a day (BID) | ORAL | 0 refills | Status: AC

## 2023-03-29 MED ORDER — DOXYCYCLINE HYCLATE 100 MG PO TABS: 100.0000 mg | ORAL_TABLET | Freq: Two times a day (BID) | ORAL | 0 refills | Status: AC

## 2023-03-29 MED ORDER — ATORVASTATIN CALCIUM 10 MG PO TABS: 10.0000 mg | ORAL_TABLET | Freq: Every day | ORAL | 3 refills | Status: DC

## 2023-03-29 NOTE — Discharge Summary (Signed)
Physician Discharge Summary   Patient: Michael Holloway MRN: [id number] DOB: Feb 11, 1929  Admit date:     03/26/2023  Discharge date: 03/29/23  Discharge Physician: Marcelino Duster   PCP: Mick Sell, MD   Recommendations at discharge:   PCP follow-up in 1 week. Podiatry follow-up as suggested in 1 to 2 weeks. Vascular follow-up as scheduled.  Discharge Diagnoses: Principal Problem:   Osteomyelitis of left foot (HCC) Active Problems:   Chronic combined systolic and diastolic CHF (congestive heart failure) (HCC)   Essential hypertension   Stage 3b chronic kidney disease (HCC)   Malignant neoplasm of overlapping sites of bladder (HCC)   Acute osteomyelitis of toe of left foot (HCC)  Resolved Problems:   * No resolved hospital problems. *  Hospital Course: Michael Holloway is a 89 y.o. male with medical history significant of  bladder cancer (s/p of TURBT, s/p of BCG), HTN, HLD, SSS (s/p of PPM), sCHF with EF 45%, gout, CKD-3B, peripheral neuropathy, who presents with left foot ulcer. He is referred to ED by podiatry office after he was found to have osteomyelitis on MRI left foot.  During hospital stay patient is seen by vascular team, he had aortogram and selective left lower extremity angiogram, stent placement of left external iliac artery.  Patient is seen by podiatry team suggested left partial fifth ray amputation which was performed on 03/28/2023.  Patient tolerated the procedure well.  He did work with physical therapy and is hemodynamically stable to be discharged home.  Podiatry team advised Augmentin, doxycycline for 7 days.  New prescription for statin, Plavix, Augmentin, Doxy sent to pharmacy.  Patient will be followed by PCP, podiatry and vascular team as outpatient.  He understands and agrees with the discharge plan.  Assessment and Plan:  Osteomyelitis of left foot (HCC):  S/p Aortogram and selective left lower extremity angiogram by vascular team, left  external stent placement.  Patient is started on Plavix, statin therapy. S/p L partial 5th ray amputation. He tolerated the procedure. Blood cultures and wound cultures will be followed by podiatry team Prescription for Augmentin, doxy therapy for 7 days given. PT worked with him on post op shoe, he has rolling walker at home.   Chronic combined systolic and diastolic CHF (congestive heart failure) (HCC): 2D echo 02/15/2021 showed EF of 45 with grade 1 diastolic dysfunction.  Patient does not have leg edema or JVD.  No shortness of breath.  CHF is compensated. He is euvolemic.  No new medications   Essential hypertension:  BP stable.   Stage 3b chronic kidney disease [facility]):  Kidney function stable. Monitor daily renal function. Avoid nephrotoxic drugs.   Malignant neoplasm of overlapping sites of bladder [facility]): Has s/p of TURBT and BCG treatment Follow-up with urologist as outpatient.         Consultants: Podiatry, vascular Procedures performed: Aortogram and selective left lower extremity angiogram, Stent placement to the left external iliac artery. S/p Left partial fifth ray amputation, left foot rotational skin flap closure less than 10 cm Disposition: Home Diet recommendation:  Discharge Diet Orders (From admission, onward)     Start     Ordered   03/29/23 0000  Diet - low sodium heart healthy        03/29/23 1121           Cardiac diet DISCHARGE MEDICATION: Allergies as of 03/29/2023       Reactions   Sulfa Antibiotics Rash  Medication List     STOP taking these medications    omeprazole 20 MG capsule Commonly known as: PRILOSEC       TAKE these medications    amoxicillin-clavulanate 250-125 MG tablet Commonly known as: AUGMENTIN Take 1 tablet by mouth every 12 (twelve) hours for 7 days.   ASPIRIN 81 PO Take by mouth.   atorvastatin 10 MG tablet Commonly known as: LIPITOR Take 1 tablet (10 mg total) by mouth daily. Start taking on:  March 30, 2023   clopidogrel 75 MG tablet Commonly known as: PLAVIX Take 1 tablet (75 mg total) by mouth daily. Start taking on: March 30, 2023   doxycycline 100 MG tablet Commonly known as: VIBRA-TABS Take 1 tablet (100 mg total) by mouth every 12 (twelve) hours for 7 days.   gabapentin 600 MG tablet Commonly known as: NEURONTIN Take 600 mg by mouth See admin instructions. Take 600 mg by mouth in the morning and afternoon and 900 mg at bedtime   traMADol 50 MG tablet Commonly known as: ULTRAM Take 50 mg by mouth 3 (three) times daily as needed for moderate pain.   XLEAR SINUS CARE SPRAY NA Place 1 spray into the nose daily.        Follow-up Information     Rosetta Posner, DPM. Schedule an appointment as soon as possible for a visit on 04/03/2023.   Specialty: Podiatry Why: For wound re-check Contact information: [address] [place] [address] [phone]         Mick Sell, MD Follow up in 1 week(s).   Specialty: Infectious Diseases Contact information: [address] [place] [address] [phone]         Annice Needy, MD Follow up in 2 week(s).   Specialties: Vascular Surgery, Radiology, Interventional Cardiology Contact information: [address] [place] [address] [phone]                Discharge Exam: Michael Holloway Weights   03/27/23 0200 03/29/23 0500  Weight: 88.1 kg 88.6 kg   General - Elderly Caucasian male, no apparent distress HEENT - PERRLA, EOMI, atraumatic head, non tender sinuses. Lung - Clear, no rales, rhonchi, wheezes. Heart - S1, S2 heard, no murmurs, rubs, trace pedal edema. Abdomen - Soft, non tender, bowel sounds good Neuro - Alert, awake and oriented x 3, non focal exam. Skin - Warm and dry. Left foot dressing, boot noted.  Condition at discharge: stable  The results of significant diagnostics from this hospitalization (including imaging, microbiology,  ancillary and laboratory) are listed below for reference.   Imaging Studies: DG Foot Complete Left  Result Date: 03/28/2023 CLINICAL DATA:  Left foot amputation EXAM: LEFT FOOT - COMPLETE 3+ VIEW COMPARISON:  03/24/2023 FINDINGS: Interval transmetatarsal amputation of the left fifth ray. Resection margin is sharp. Remaining bony structures are intact. No fracture or dislocation. No erosive changes. Degenerative changes are most notable at the first MTP joint and within the dorsal aspect of the midfoot. Small plantar calcaneal spur. Expected postoperative changes adjacent to the residual fifth metatarsal. IMPRESSION: Interval transmetatarsal amputation of the left fifth ray. No acute findings. Electronically Signed   By: Duanne Guess D.O.   On: 03/28/2023 12:45   PERIPHERAL VASCULAR CATHETERIZATION  Result Date: 03/27/2023 See surgical note for result.   Microbiology: Results for orders placed or performed during the hospital encounter of 03/26/23  Blood Culture (routine x 2)     Status: None (Preliminary result)  Collection Time: 03/26/23 12:19 PM   Specimen: BLOOD  Result Value Ref Range Status   Specimen Description BLOOD RIGHT ANTECUBITAL  Final   Special Requests   Final    BOTTLES DRAWN AEROBIC AND ANAEROBIC Blood Culture adequate volume   Culture   Final    NO GROWTH 3 DAYS Performed at [facility], [address], [place], [place] [address]    Report Status PENDING  Incomplete  Blood Culture (routine x 2)     Status: None (Preliminary result)   Collection Time: 03/26/23  6:58 PM   Specimen: BLOOD  Result Value Ref Range Status   Specimen Description BLOOD RIGHT ANTECUBITAL  Final   Special Requests   Final    BOTTLES DRAWN AEROBIC AND ANAEROBIC Blood Culture results may not be optimal due to an excessive volume of blood received in culture bottles   Culture   Final    NO GROWTH 3 DAYS Performed at [facility], [address], [place], [place]  [address]    Report Status PENDING  Incomplete  Resp panel by RT-PCR (RSV, Flu A&B, Covid) Anterior Nasal Swab     Status: None   Collection Time: 03/26/23  7:03 PM   Specimen: Anterior Nasal Swab  Result Value Ref Range Status   SARS Coronavirus 2 by RT PCR NEGATIVE NEGATIVE Final    Comment: (NOTE) SARS-CoV-2 target nucleic acids are NOT DETECTED.  The SARS-CoV-2 RNA is generally detectable in upper respiratory specimens during the acute phase of infection. The lowest concentration of SARS-CoV-2 viral copies this assay can detect is 138 copies/mL. A negative result does not preclude SARS-Cov-2 infection and should not be used as the sole basis for treatment or other patient management decisions. A negative result may occur with  improper specimen collection/handling, submission of specimen other than nasopharyngeal swab, presence of viral mutation(s) within the areas targeted by this assay, and inadequate number of viral copies(<138 copies/mL). A negative result must be combined with clinical observations, patient history, and epidemiological information. The expected result is Negative.  Fact Sheet for Patients:  BloggerCourse.com  Fact Sheet for Healthcare Providers:  SeriousBroker.it  This test is no t yet approved or cleared by the [place] FDA and  has been authorized for detection and/or diagnosis of SARS-CoV-2 by FDA under an Emergency Use Authorization (EUA). This EUA will remain  in effect (meaning this test can be used) for the duration of the COVID-19 declaration under Section 564(b)(1) of the Act, 21 U.S.C.section 360bbb-3(b)(1), unless the authorization is terminated  or revoked sooner.       Influenza A by PCR NEGATIVE NEGATIVE Final   Influenza B by PCR NEGATIVE NEGATIVE Final    Comment: (NOTE) The Xpert Xpress SARS-CoV-2/FLU/RSV plus assay is intended as an aid in the diagnosis of influenza from  Nasopharyngeal swab specimens and should not be used as a sole basis for treatment. Nasal washings and aspirates are unacceptable for Xpert Xpress SARS-CoV-2/FLU/RSV testing.  Fact Sheet for Patients: BloggerCourse.com  Fact Sheet for Healthcare Providers: SeriousBroker.it  This test is not yet approved or cleared by the [place] FDA and has been authorized for detection and/or diagnosis of SARS-CoV-2 by FDA under an Emergency Use Authorization (EUA). This EUA will remain in effect (meaning this test can be used) for the duration of the COVID-19 declaration under Section 564(b)(1) of the Act, 21 U.S.C. section 360bbb-3(b)(1), unless the authorization is terminated or revoked.     Resp Syncytial Virus by  PCR NEGATIVE NEGATIVE Final    Comment: (NOTE) Fact Sheet for Patients: BloggerCourse.com  Fact Sheet for Healthcare Providers: SeriousBroker.it  This test is not yet approved or cleared by the [place] FDA and has been authorized for detection and/or diagnosis of SARS-CoV-2 by FDA under an Emergency Use Authorization (EUA). This EUA will remain in effect (meaning this test can be used) for the duration of the COVID-19 declaration under Section 564(b)(1) of the Act, 21 U.S.C. section 360bbb-3(b)(1), unless the authorization is terminated or revoked.  Performed at [facility], [address], [place], [place] [address]   Surgical PCR screen     Status: None   Collection Time: 03/27/23  1:15 AM   Specimen: Nasal Mucosa; Nasal Swab  Result Value Ref Range Status   MRSA, PCR NEGATIVE NEGATIVE Final   Staphylococcus aureus NEGATIVE NEGATIVE Final    Comment: (NOTE) The Xpert SA Assay (FDA approved for NASAL specimens in patients 60 years of age and older), is one component of a comprehensive surveillance program. It is not intended to diagnose infection nor  to guide or monitor treatment. Performed at [facility], [address] [place], [place] [address]   Aerobic/Anaerobic Culture w Gram Stain (surgical/deep wound)     Status: None (Preliminary result)   Collection Time: 03/28/23  8:04 AM   Specimen: Foot, Left; Abscess  Result Value Ref Range Status   Specimen Description   Final    WOUND Performed at [facility], [address], [place], [place] [address]    Special Requests   Final    FOOT Performed at [facility], [address] [place], [place] [address]    Gram Stain   Final    NO WBC SEEN NO ORGANISMS SEEN Performed at [facility], [address], [place], [place] [address]    Culture PENDING  Incomplete   Report Status PENDING  Incomplete  Aerobic/Anaerobic Culture w Gram Stain (surgical/deep wound)     Status: None (Preliminary result)   Collection Time: 03/28/23  8:07 AM   Specimen: PATH Bone biopsy; Tissue  Result Value Ref Range Status   Specimen Description   Final    BONE Performed at [facility], [address], [place], [place] [address]    Special Requests   Final    NONE Performed at [facility], [address] [place], [place] [address]    Gram Stain   Final    NO WBC SEEN NO ORGANISMS SEEN Performed at [facility], [address], [place], [place] [address]    Culture PENDING  Incomplete   Report Status PENDING  Incomplete    Labs: CBC: Recent Labs  Lab 03/26/23 1219 03/27/23 0611  WBC 7.8 6.6  NEUTROABS 6.5  --   HGB 14.1 11.6*  HCT 44.0 35.1*  MCV 87.3 83.8  PLT 231 195   Basic Metabolic Panel: Recent Labs  Lab 03/26/23 1219 03/27/23 0611 03/28/23 0403 03/29/23 0618  NA 141 141  --   --   K 4.0 3.7  --   --   CL 107 111  --   --   CO2 24 20*  --   --   GLUCOSE 106* 72  --   --   BUN 31* 30*  --   --   CREATININE 1.92* 1.86* 1.87* 1.67*  CALCIUM 8.7* 8.1*  --   --    Liver Function Tests: Recent Labs  Lab 03/26/23 1219  AST 20  ALT 13  ALKPHOS 102  BILITOT 0.3  PROT 6.8  ALBUMIN 3.6   CBG: No results for input(s): "GLUCAP" in the last 168 hours.  Discharge time spent: 35 minutes.  Signed: Marcelino Duster, MD [facility] 03/29/2023

## 2023-03-29 NOTE — Plan of Care (Signed)

## 2023-03-31 LAB — CULTURE, BLOOD (ROUTINE X 2)
Culture: NO GROWTH
Culture: NO GROWTH
Special Requests: ADEQUATE

## 2023-03-31 LAB — SURGICAL PATHOLOGY

## 2023-04-02 LAB — AEROBIC/ANAEROBIC CULTURE W GRAM STAIN (SURGICAL/DEEP WOUND)
Culture: NO GROWTH
Gram Stain: NONE SEEN
Gram Stain: NONE SEEN

## 2023-04-23 ENCOUNTER — Telehealth: Payer: Self-pay | Admitting: Urology

## 2023-04-23 NOTE — Telephone Encounter (Signed)
Pt states he has had burning with urination that comes and goes x 2 weeks.   + malaise + lower abd pain and back pain + pushing fluids Urine yellow in urine  Neg fevers Neg hematuria   Pls advise.

## 2023-04-23 NOTE — Telephone Encounter (Signed)
Pt has cysto 12/18 w/Brandon.  She has nothing else before then.  Pt is having back pain radiating around to lower part of stomach.  He also has 3rd stage kidney failure.  He wanted to move cysto up, but there's nothing.  Please advise.

## 2023-04-24 ENCOUNTER — Telehealth: Payer: Self-pay | Admitting: *Deleted

## 2023-04-24 ENCOUNTER — Ambulatory Visit: Payer: Medicare Other | Admitting: Physician Assistant

## 2023-04-24 ENCOUNTER — Encounter: Payer: Self-pay | Admitting: Physician Assistant

## 2023-04-24 VITALS — Ht 76.0 in | Wt 195.0 lb

## 2023-04-24 DIAGNOSIS — R300 Dysuria: Secondary | ICD-10-CM

## 2023-04-24 DIAGNOSIS — R1032 Left lower quadrant pain: Secondary | ICD-10-CM

## 2023-04-24 DIAGNOSIS — Z87898 Personal history of other specified conditions: Secondary | ICD-10-CM | POA: Diagnosis not present

## 2023-04-24 LAB — URINALYSIS, COMPLETE
Bilirubin, UA: NEGATIVE
Glucose, UA: NEGATIVE
Ketones, UA: NEGATIVE
Nitrite, UA: NEGATIVE
RBC, UA: NEGATIVE
Specific Gravity, UA: 1.01 (ref 1.005–1.030)
Urobilinogen, Ur: 0.2 mg/dL (ref 0.2–1.0)
pH, UA: 6 (ref 5.0–7.5)

## 2023-04-24 LAB — MICROSCOPIC EXAMINATION: Epithelial Cells (non renal): >10 /[HPF] — AB (ref 0–10)

## 2023-04-24 LAB — BLADDER SCAN AMB NON-IMAGING

## 2023-04-24 NOTE — Telephone Encounter (Signed)
Pt seen today by SM.

## 2023-04-24 NOTE — Telephone Encounter (Signed)
Left message with both pt and son and advised the urine sample did not look to bad and to keep the appt next week for cysto.

## 2023-04-24 NOTE — Progress Notes (Signed)
04/24/2023 1:00 PM   Michael Holloway 1928-06-29 [id number]  CC: Chief Complaint  Patient presents with   Follow-up   Urinary Tract Infection   HPI: Michael Holloway is a 89 y.o. male with PMH recurrent urothelial carcinoma scheduled for surveillance cystoscopy with Dr. Apolinar Junes next week, nephrolithiasis, and spinal stenosis who presents today for evaluation of possible UTI.  He is accompanied today by his son, who contributes to HPI.  Today he reports he fell at the [facility] in late October and has been having some nerve pain in his neck ever since.  Dr. Sampson Goon prescribed prednisone and baclofen, but he did not tolerate these particularly well.  He still is having issues with pain and generalized weakness.  Today he is primarily concerned with left low back pain that radiates to the LLQ which has been intermittent over the past week.  He describes the pain as an ache that can be rather severe.  He has also had chronic intermittent dysuria times months.  He denies fever, chills, nausea, vomiting, or gross hematuria.  Most recent creatinine dated 03/29/2023 was stable at 1.67.  He is unable to provide a urine specimen today.  PVR 2mL.  PMH: Past Medical History:  Diagnosis Date   Allergy    Arthritis    Bladder cancer (HCC)    Cancer (HCC)    Basal Cell Skin Cancer   Cataract    Chronic kidney disease    Stage 3 CKD   Dysrhythmia    GERD (gastroesophageal reflux disease)    Gout    History of kidney stones    Hypertension    Hypotension 05/24/2020   Neuropathy    Presence of permanent cardiac pacemaker 2020   Spinal stenosis     Surgical History: Past Surgical History:  Procedure Laterality Date   AMPUTATION Left 03/28/2023   Procedure: PARTIAL 5TH RAY AMPUTATION;  Surgeon: Rosetta Posner, DPM;  Location: [facility];  Service: Orthopedics/Podiatry;  Laterality: Left;   BACK SURGERY     CERVICAL SPINE SURGERY      X 2   CHOLECYSTECTOMY     CYSTOSCOPY W/ RETROGRADES  Bilateral 08/25/2016   Procedure: CYSTOSCOPY WITH RETROGRADE PYELOGRAM;  Surgeon: Vanna Scotland, MD;  Location: [facility];  Service: Urology;  Laterality: Bilateral;   CYSTOSCOPY W/ RETROGRADES Bilateral 07/18/2019   Procedure: CYSTOSCOPY WITH RETROGRADE PYELOGRAM;  Surgeon: Vanna Scotland, MD;  Location: [facility];  Service: Urology;  Laterality: Bilateral;   CYSTOSCOPY W/ RETROGRADES Bilateral 03/26/2020   Procedure: CYSTOSCOPY WITH RETROGRADE PYELOGRAM;  Surgeon: Vanna Scotland, MD;  Location: [facility];  Service: Urology;  Laterality: Bilateral;   EYE SURGERY Bilateral    Cataract Extraction with IOL   JOINT REPLACEMENT Bilateral    total knee replacements   LOOP RECORDER INSERTION N/A 06/03/2017   Procedure: LOOP RECORDER INSERTION;  Surgeon: Marcina Millard, MD;  Location: [facility];  Service: Cardiovascular;  Laterality: N/A;   LOWER EXTREMITY ANGIOGRAPHY Left 03/27/2023   Procedure: Lower Extremity Angiography;  Surgeon: Annice Needy, MD;  Location: [facility];  Service: Cardiovascular;  Laterality: Left;   LUMBAR LAMINECTOMY      X 4   PACEMAKER INSERTION Left 08/03/2018   Procedure: INSERTION PACEMAKER DUALCHAMBER;  Surgeon: Marcina Millard, MD;  Location: [facility];  Service: Cardiovascular;  Laterality: Left;   PROSTATE SURGERY  1990s   Dr. Sheppard Penton ,in office procedure   TONSILLECTOMY     TOTAL KNEE ARTHROPLASTY Right 09/29/2019  Procedure: TOTAL KNEE ARTHROPLASTY;  Surgeon: Christena Flake, MD;  Location: [facility];  Service: Orthopedics;  Laterality: Right;   TRANSURETHRAL RESECTION OF BLADDER TUMOR WITH MITOMYCIN-C N/A 08/25/2016   Procedure: TRANSURETHRAL RESECTION OF BLADDER TUMOR WITH MITOMYCIN-C;  Surgeon: Vanna Scotland, MD;  Location: [facility];  Service: Urology;  Laterality: N/A;   TRANSURETHRAL RESECTION OF BLADDER TUMOR WITH MITOMYCIN-C N/A 07/18/2019   Procedure: TRANSURETHRAL RESECTION OF BLADDER TUMOR WITH gemcitabine;  Surgeon: Vanna Scotland,  MD;  Location: [facility];  Service: Urology;  Laterality: N/A;   TRANSURETHRAL RESECTION OF BLADDER TUMOR WITH MITOMYCIN-C N/A 03/26/2020   Procedure: TRANSURETHRAL RESECTION OF BLADDER TUMOR WITH gemcitabine;  Surgeon: Vanna Scotland, MD;  Location: [facility];  Service: Urology;  Laterality: N/A;   TRANSURETHRAL RESECTION OF PROSTATE N/A 06/14/2018   Procedure: TRANSURETHRAL RESECTION OF THE PROSTATE (TURP) with Gemcitabine;  Surgeon: Vanna Scotland, MD;  Location: [facility];  Service: Urology;  Laterality: N/A;    Home Medications:  Allergies as of 04/24/2023       Reactions   Sulfa Antibiotics Rash        Medication List        Accurate as of April 24, 2023  1:00 PM. If you have any questions, ask your nurse or doctor.          ASPIRIN 81 PO Take by mouth.   atorvastatin 10 MG tablet Commonly known as: LIPITOR Take 1 tablet (10 mg total) by mouth daily.   clopidogrel 75 MG tablet Commonly known as: PLAVIX Take 1 tablet (75 mg total) by mouth daily.   gabapentin 600 MG tablet Commonly known as: NEURONTIN Take 600 mg by mouth See admin instructions. Take 600 mg by mouth in the morning and afternoon and 900 mg at bedtime   traMADol 50 MG tablet Commonly known as: ULTRAM Take 50 mg by mouth 3 (three) times daily as needed for moderate pain.   XLEAR SINUS CARE SPRAY NA Place 1 spray into the nose daily.        Allergies:  Allergies  Allergen Reactions   Sulfa Antibiotics Rash    Family History: Family History  Problem Relation Age of Onset   Cancer Mother    Stroke Father    Bladder Cancer Neg Hx    Kidney cancer Neg Hx    Prostate cancer Neg Hx     Social History:   reports that he quit smoking about 44 years ago. His smoking use included cigarettes. He started smoking about 74 years ago. He has a 15 pack-year smoking history. He has been exposed to tobacco smoke. He has never used smokeless tobacco. He reports that he does not currently use  alcohol. He reports that he does not use drugs.  Physical Exam: Ht 6\' 4"  (1.93 m)   Wt 195 lb (88.5 kg)   BMI 23.74 kg/m   Constitutional:  Alert and oriented, no acute distress, nontoxic appearing HEENT: [place], AT Cardiovascular: No clubbing, cyanosis, or edema Respiratory: Normal respiratory effort, no increased work of breathing GU: No CVA tenderness Skin: No rashes, bruises or suspicious lesions Neurologic: Grossly intact, no focal deficits, moving all 4 extremities Psychiatric: Normal mood and affect  Laboratory Data: Results for orders placed or performed in visit on 04/24/23  BLADDER SCAN AMB NON-IMAGING   Collection Time: 04/24/23 10:58 AM  Result Value Ref Range   Scan Result 2ml    Assessment & Plan:   1. Dysuria (Primary) Chronic, stable.  He is not  in retention.  He will try to collect a urine specimen at home and bring it back later today for testing.  Regardless, agree with surveillance cystoscopy with Dr. Apolinar Junes next week, though will start him on antibiotics as needed per UA results. - BLADDER SCAN AMB NON-IMAGING - Urinalysis, Complete - CULTURE, URINE COMPREHENSIVE  2. LLQ pain No flank pain, gross hematuria, fever, chills, nausea, vomiting, or CVA tenderness today.  We discussed that I suspect this is musculoskeletal pain especially in light of his recent fall.  I think this pain is unlikely to represent renal colic in the absence of CVA tenderness, nausea, or vomiting.  Return for Will call with results.  Carman Ching, PA-C  [facility] [place] [address] [place], [place] [address] [phone]

## 2023-04-27 ENCOUNTER — Telehealth (INDEPENDENT_AMBULATORY_CARE_PROVIDER_SITE_OTHER): Payer: Self-pay | Admitting: Nurse Practitioner

## 2023-04-27 NOTE — Telephone Encounter (Signed)
LVM for pt TCBa nd schedule appt   This patient needs to see ME or JD in 3-4 weeks with ABI and Aorta Illiac Duplex

## 2023-04-28 LAB — CULTURE, URINE COMPREHENSIVE

## 2023-04-29 ENCOUNTER — Ambulatory Visit: Payer: Medicare Other | Admitting: Urology

## 2023-04-29 VITALS — BP 110/71 | HR 103

## 2023-04-29 DIAGNOSIS — R310 Gross hematuria: Secondary | ICD-10-CM

## 2023-04-29 DIAGNOSIS — Z8551 Personal history of malignant neoplasm of bladder: Secondary | ICD-10-CM | POA: Diagnosis not present

## 2023-04-29 LAB — URINALYSIS, COMPLETE
Bilirubin, UA: NEGATIVE
Glucose, UA: NEGATIVE
Ketones, UA: NEGATIVE
Nitrite, UA: NEGATIVE
Specific Gravity, UA: 1.015 (ref 1.005–1.030)
Urobilinogen, Ur: 0.2 mg/dL (ref 0.2–1.0)
pH, UA: 6 (ref 5.0–7.5)

## 2023-04-29 LAB — MICROSCOPIC EXAMINATION: Epithelial Cells (non renal): >10 /[HPF] — AB (ref 0–10)

## 2023-04-29 NOTE — Progress Notes (Signed)
    04/29/23   CC:  Chief Complaint  Patient presents with   Cysto    HPI: Michael Holloway is a 89 y.o. male with a personal history of recurrent urothelial carcinoma, who returns today for surveillance cystoscopy.   In 03/2017, he was noted to have small recurrences mostly conistent with low-grade noninvasive lesions.  He is now undergone 3 office procedures for an office fulguration for low-grade superficial appearing tumors including on 03/31/2017, 05/2016, and 11/2017.     Additionally he had low-grade appearing recurrence at the dome and underwent office fulguration of these 01/2019.    In 06/2018 he returned to the OR for repeat TURBT for small tumors involving the prostatic urethra and bladder neck, small.  Tumors were primarily low-grade with some high-grade features, superficial.  Muscularis propria was present but not involved.  Intravesical gemcitabine was instilled.   He underwent BCG and full induction course in 05/2019.    He underwent another TURBT on 07/18/2019.  Carpeting of low-grade appearing papillary tumor at dome of bladder, just to the right of previously fulgurated area in the office, measuring approximately 1 cm.  There is a few small satellite lesions as well ranging from 1 to 2 mm. Unremarkable retrograde pyelogram.    He had another recurrence and returned to OR 03/26/2020.   Papillary carpeting involving the right lateral bladder wall extending towards bladder neck, at least 2.5 cm x 2 cm area in diameter. There is also a small area of carpeting at the dome, less than 1 cm.  Bilateral retrograde unremarkable. Chemotherapy was instilled postoperatively, gemcitabine.  Surgical pathology consistent with recurrent low-grade noninvasive disease.   He s/p induction course of intravesical gemcitabine.   Most recently noted to have a recurrence on his bladder neck noted on retroflexion and underwent office fulguration of this on 02/03/2023.  He notes today in the interim,  he ended up having an episode of possible syncope as well as a foot issue.   Vitals:   04/29/23 1105  BP: 110/71  Pulse: (!) 103    NED. A&Ox3.   No respiratory distress   Abd soft, NT, ND Normal phallus with bilateral descended testicles  Cystoscopy Procedure Note  Patient identification was confirmed, informed consent was obtained, and patient was prepped using Betadine solution.  Lidocaine jelly was administered per urethral meatus.    Final dose of Keflex was given preprocedure.  Bladder was instilled with lidocaine solution for 30 minutes prior to today's procedure.  Please see CMA note.  Pre-Procedure: - Inspection reveals a normal caliber ureteral meatus.  Procedure: The flexible cystoscope was introduced without difficulty - No urethral strictures/lesions are present. - Enlarged prostate slightly irregular fossa  - Normal bladder neck - Bilateral ureteral orifices identified - Bladder mucosa  reveals no ulcers, tumors, or lesions - Multiple stellate scars  - No bladder stones -Moderate trabeculation particularly at the dome where there are multiple hypervascular areas likely from scarring; stable without any papillary tumor  Retroflexion today was unremarkable without any evidence of recurrence.   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

## 2023-05-04 ENCOUNTER — Telehealth (INDEPENDENT_AMBULATORY_CARE_PROVIDER_SITE_OTHER): Payer: Self-pay | Admitting: Nurse Practitioner

## 2023-05-04 NOTE — Telephone Encounter (Signed)
 LVM for pt TCBa nd schedule appt   This patient needs to see ME or JD in 3-4 weeks with ABI and Aorta Illiac Duplex

## 2023-08-07 ENCOUNTER — Other Ambulatory Visit: Payer: Self-pay | Admitting: Nephrology

## 2023-08-07 DIAGNOSIS — R809 Proteinuria, unspecified: Secondary | ICD-10-CM

## 2023-08-07 DIAGNOSIS — I129 Hypertensive chronic kidney disease with stage 1 through stage 4 chronic kidney disease, or unspecified chronic kidney disease: Secondary | ICD-10-CM

## 2023-08-07 DIAGNOSIS — N184 Chronic kidney disease, stage 4 (severe): Secondary | ICD-10-CM

## 2023-08-11 ENCOUNTER — Ambulatory Visit
Admission: RE | Admit: 2023-08-11 | Discharge: 2023-08-11 | Disposition: A | Discharge status: Home / Self Care | Source: Ambulatory Visit | Attending: Nephrology | Admitting: Nephrology

## 2023-08-11 DIAGNOSIS — N184 Chronic kidney disease, stage 4 (severe): Secondary | ICD-10-CM | POA: Insufficient documentation

## 2023-08-11 DIAGNOSIS — I129 Hypertensive chronic kidney disease with stage 1 through stage 4 chronic kidney disease, or unspecified chronic kidney disease: Secondary | ICD-10-CM | POA: Diagnosis present

## 2023-08-11 DIAGNOSIS — R809 Proteinuria, unspecified: Secondary | ICD-10-CM | POA: Insufficient documentation

## 2023-08-25 ENCOUNTER — Other Ambulatory Visit: Payer: Self-pay | Admitting: Urology

## 2023-09-01 DIAGNOSIS — N189 Chronic kidney disease, unspecified: Secondary | ICD-10-CM | POA: Insufficient documentation

## 2023-09-01 DIAGNOSIS — D649 Anemia, unspecified: Secondary | ICD-10-CM | POA: Diagnosis present

## 2023-09-29 ENCOUNTER — Encounter (INDEPENDENT_AMBULATORY_CARE_PROVIDER_SITE_OTHER): Payer: Self-pay

## 2023-10-07 ENCOUNTER — Other Ambulatory Visit: Payer: Self-pay | Admitting: Nephrology

## 2023-10-07 ENCOUNTER — Other Ambulatory Visit: Payer: Self-pay

## 2023-10-07 DIAGNOSIS — R7612 Nonspecific reaction to cell mediated immunity measurement of gamma interferon antigen response without active tuberculosis: Secondary | ICD-10-CM

## 2023-10-14 ENCOUNTER — Inpatient Hospital Stay: Attending: Oncology | Admitting: Oncology

## 2023-10-14 ENCOUNTER — Inpatient Hospital Stay

## 2023-10-14 ENCOUNTER — Ambulatory Visit: Payer: Self-pay | Admitting: Oncology

## 2023-10-14 ENCOUNTER — Encounter: Payer: Self-pay | Admitting: Oncology

## 2023-10-14 VITALS — BP 123/62 | HR 66 | Temp 98.3°F | Resp 18

## 2023-10-14 DIAGNOSIS — Z8551 Personal history of malignant neoplasm of bladder: Secondary | ICD-10-CM | POA: Insufficient documentation

## 2023-10-14 DIAGNOSIS — N185 Chronic kidney disease, stage 5: Secondary | ICD-10-CM | POA: Diagnosis present

## 2023-10-14 DIAGNOSIS — Z87891 Personal history of nicotine dependence: Secondary | ICD-10-CM | POA: Diagnosis not present

## 2023-10-14 DIAGNOSIS — D631 Anemia in chronic kidney disease: Secondary | ICD-10-CM

## 2023-10-14 DIAGNOSIS — N183 Chronic kidney disease, stage 3 unspecified: Secondary | ICD-10-CM | POA: Insufficient documentation

## 2023-10-14 LAB — IRON AND TIBC
Iron: 28 ug/dL — ABNORMAL LOW (ref 45–182)
Saturation Ratios: 11 % — ABNORMAL LOW (ref 17.9–39.5)
TIBC: 258 ug/dL (ref 250–450)
UIBC: 230 ug/dL

## 2023-10-14 LAB — CBC WITH DIFFERENTIAL/PLATELET
Abs Immature Granulocytes: 0.03 10*3/uL (ref 0.00–0.07)
Basophils Absolute: 0 10*3/uL (ref 0.0–0.1)
Basophils Relative: 0 %
Eosinophils Absolute: 0.1 10*3/uL (ref 0.0–0.5)
Eosinophils Relative: 1 %
HCT: 21.7 % — ABNORMAL LOW (ref 39.0–52.0)
Hemoglobin: 7.1 g/dL — ABNORMAL LOW (ref 13.0–17.0)
Immature Granulocytes: 0 %
Lymphocytes Relative: 8 %
Lymphs Abs: 0.8 10*3/uL (ref 0.7–4.0)
MCH: 30.6 pg (ref 26.0–34.0)
MCHC: 32.7 g/dL (ref 30.0–36.0)
MCV: 93.5 fL (ref 80.0–100.0)
Monocytes Absolute: 0.9 10*3/uL (ref 0.1–1.0)
Monocytes Relative: 10 %
Neutro Abs: 7.3 10*3/uL (ref 1.7–7.7)
Neutrophils Relative %: 81 %
Platelets: 212 10*3/uL (ref 150–400)
RBC: 2.32 MIL/uL — ABNORMAL LOW (ref 4.22–5.81)
RDW: 14.4 % (ref 11.5–15.5)
WBC: 9.1 10*3/uL (ref 4.0–10.5)
nRBC: 0 % (ref 0.0–0.2)

## 2023-10-14 LAB — HEPATIC FUNCTION PANEL
ALT: 11 U/L (ref 0–44)
AST: 12 U/L — ABNORMAL LOW (ref 15–41)
Albumin: 3.6 g/dL (ref 3.5–5.0)
Alkaline Phosphatase: 71 U/L (ref 38–126)
Bilirubin, Direct: <0.1 mg/dL (ref 0.0–0.2)
Total Bilirubin: 0.7 mg/dL (ref 0.0–1.2)
Total Protein: 6.3 g/dL — ABNORMAL LOW (ref 6.5–8.1)

## 2023-10-14 LAB — TECHNOLOGIST SMEAR REVIEW: Plt Morphology: ADEQUATE

## 2023-10-14 LAB — FERRITIN: Ferritin: 123 ng/mL (ref 24–336)

## 2023-10-14 LAB — SAMPLE TO BLOOD BANK

## 2023-10-14 LAB — RETIC PANEL
Immature Retic Fract: 10.5 % (ref 2.3–15.9)
RBC.: 2.32 MIL/uL — ABNORMAL LOW (ref 4.22–5.81)
Retic Count, Absolute: 31.6 10*3/uL (ref 19.0–186.0)
Retic Ct Pct: 1.4 % (ref 0.4–3.1)
Reticulocyte Hemoglobin: 31.7 pg (ref >27.9)

## 2023-10-14 LAB — FOLATE: Folate: >40 ng/mL (ref >5.9)

## 2023-10-14 LAB — VITAMIN B12: Vitamin B-12: 520 pg/mL (ref 180–914)

## 2023-10-14 LAB — LACTATE DEHYDROGENASE: LDH: 70 U/L — ABNORMAL LOW (ref 98–192)

## 2023-10-14 NOTE — Progress Notes (Addendum)
 Hematology/Oncology Consult note [phone] [phone]        REFERRING PROVIDER: Gari Junior, Holloway   CHIEF COMPLAINTS/REASON FOR VISIT:  Evaluation of anemia due to CKD   ASSESSMENT & PLAN:   Anemia in chronic kidney disease (CKD) Anemia is likely due to advanced CKD.  Rule out other etiology.  Check cbc smear, iron tibc ferritin, retic panel, B12, folate.  Lab Results  Component Value Date   HGB 7.1 (L) 10/14/2023   TIBC 258 10/14/2023   IRONPCTSAT 11 (L) 10/14/2023   FERRITIN 123 10/14/2023    I recommend IV venofer weekly x 3 to increase iron store. Goal of Ferritin is 200.  I discussed about option of continue oral iron supplementation and repeat blood work for evaluation of treatment response.  If no significant improvement, then proceed with IV Venofer treatments. Alternative option of proceed with IV Venofer treatments. I discussed about the potential risks including but not limited to allergic reactions/infusion reactions including anaphylactic reactions, diarrhea, phlebitis, high blood pressure, wheezing, SOB, skin rash, weight gain,dark urine, leg swelling, back pain, headache, nausea and fatigue, etc. Patient agrees with the plan.  We also discussed erythropoietin therapy. Rationale and side effects were reviewed with patient. He agrees with the plan.    Orders Placed This Encounter  Procedures   Vitamin B12    Standing Status:   Future    Number of Occurrences:   1    Expected Date:   10/14/2023    Expiration Date:   10/13/2024   Folate    Standing Status:   Future    Number of Occurrences:   1    Expected Date:   10/14/2023    Expiration Date:   10/13/2024   Iron and TIBC    Standing Status:   Future    Number of Occurrences:   1    Expected Date:   10/14/2023    Expiration Date:   10/13/2024   Ferritin    Standing Status:   Future    Number of Occurrences:   1    Expected Date:   10/14/2023    Expiration Date:   04/14/2024   CBC with  Differential/Platelet    Standing Status:   Future    Number of Occurrences:   1    Expected Date:   10/14/2023    Expiration Date:   10/13/2024   Retic Panel    Standing Status:   Future    Number of Occurrences:   1    Expected Date:   10/14/2023    Expiration Date:   10/13/2024   Lactate dehydrogenase    Standing Status:   Future    Number of Occurrences:   1    Expected Date:   10/14/2023    Expiration Date:   10/13/2024   Haptoglobin    Standing Status:   Future    Number of Occurrences:   1    Expected Date:   10/14/2023    Expiration Date:   10/13/2024   Hepatic function panel    Standing Status:   Future    Number of Occurrences:   1    Expected Date:   10/14/2023    Expiration Date:   10/13/2024   Technologist smear review    Clinical information::   anemia   CBC (Cancer Center Only)    Standing Status:   Standing    Number of Occurrences:   4    Expiration Date:  10/13/2024   Hold Tube- Blood Bank    Standing Status:   Future    Number of Occurrences:   1    Expected Date:   10/14/2023    Expiration Date:   10/13/2024    All questions were answered. The patient knows to call the clinic with any problems, questions or concerns.  Michael Holloway [facility] Hematology Oncology 10/14/2023   HISTORY OF PRESENTING ILLNESS:   Michael Holloway is a  89 y.o.  male with PMH listed below was seen in consultation at the request of  Michael Holloway  for evaluation of anemia due to chronic kidney disease.  Patient has a history of recurrent noninvasive bladder cancer. 03/26/2020 Pathology showed non invasive papillary urothelia carcinoma, low grade. Muscularis propria present and uninvolved.  Given that patient has had high frequency and recurrence despite induction BCG, patient received intravesical gemcitabine  weekly x 6 to help to reduce the frequency and severity of recurrence.  Patient presents to reestablish care today for anemia. He has a history of chronic kidney disease, most  recent creatinine was 5.48, GFR 10. He had acute kidney injury secondary to IV contrast exposure in October 2024 with limited recovery.  Patient follows up with nephrologist. Previous protein electrophoresis showed no M protein. Patient has been recommended for peritoneal dialysis.  He was referred to establish care with surgery for peritoneal dialysis catheter evaluation.  Patient had hemoglobin level 11 in November 2024.  He had a rapid decline of hemoglobin to 7.2 on most recent blood work on 10/01/2023.  He denies any rectal bleeding, dark stool.  Denies any abdominal pain.  He feels very weak. Today patient was accompanied by his son Michael Holloway.  MEDICAL HISTORY:  Past Medical History:  Diagnosis Date   Allergy    Arthritis    Bladder cancer (HCC)    Cancer (HCC)    Basal Cell Skin Cancer   Cataract    Chronic kidney disease    Stage 3 CKD   Dysrhythmia    GERD (gastroesophageal reflux disease)    Gout    History of kidney stones    Hypertension    Hypotension 05/24/2020   Neuropathy    Presence of permanent cardiac pacemaker 2020   Spinal stenosis     SURGICAL HISTORY: Past Surgical History:  Procedure Laterality Date   AMPUTATION Left 03/28/2023   Procedure: PARTIAL 5TH RAY AMPUTATION;  Surgeon: Pink Bridges, DPM;  Location: [facility];  Service: Orthopedics/Podiatry;  Laterality: Left;   BACK SURGERY     CERVICAL SPINE SURGERY      X 2   CHOLECYSTECTOMY     CYSTOSCOPY W/ RETROGRADES Bilateral 08/25/2016   Procedure: CYSTOSCOPY WITH RETROGRADE PYELOGRAM;  Surgeon: Dustin Gimenez, Holloway;  Location: [facility];  Service: Urology;  Laterality: Bilateral;   CYSTOSCOPY W/ RETROGRADES Bilateral 07/18/2019   Procedure: CYSTOSCOPY WITH RETROGRADE PYELOGRAM;  Surgeon: Dustin Gimenez, Holloway;  Location: [facility];  Service: Urology;  Laterality: Bilateral;   CYSTOSCOPY W/ RETROGRADES Bilateral 03/26/2020   Procedure: CYSTOSCOPY WITH RETROGRADE PYELOGRAM;  Surgeon: Dustin Gimenez, Holloway;  Location:  [facility];  Service: Urology;  Laterality: Bilateral;   EYE SURGERY Bilateral    Cataract Extraction with IOL   JOINT REPLACEMENT Bilateral    total knee replacements   LOOP RECORDER INSERTION N/A 06/03/2017   Procedure: LOOP RECORDER INSERTION;  Surgeon: Percival Brace, Holloway;  Location: [facility];  Service: Cardiovascular;  Laterality: N/A;   LOWER EXTREMITY  ANGIOGRAPHY Left 03/27/2023   Procedure: Lower Extremity Angiography;  Surgeon: Celso College, Holloway;  Location: [facility];  Service: Cardiovascular;  Laterality: Left;   LUMBAR LAMINECTOMY      X 4   PACEMAKER INSERTION Left 08/03/2018   Procedure: INSERTION PACEMAKER DUALCHAMBER;  Surgeon: Percival Brace, Holloway;  Location: [facility];  Service: Cardiovascular;  Laterality: Left;   PROSTATE SURGERY  1990s   Dr. Sullivan Endow ,in office procedure   TONSILLECTOMY     TOTAL KNEE ARTHROPLASTY Right 09/29/2019   Procedure: TOTAL KNEE ARTHROPLASTY;  Surgeon: Elner Hahn, Holloway;  Location: [facility];  Service: Orthopedics;  Laterality: Right;   TRANSURETHRAL RESECTION OF BLADDER TUMOR WITH MITOMYCIN -C N/A 08/25/2016   Procedure: TRANSURETHRAL RESECTION OF BLADDER TUMOR WITH MITOMYCIN -C;  Surgeon: Dustin Gimenez, Holloway;  Location: [facility];  Service: Urology;  Laterality: N/A;   TRANSURETHRAL RESECTION OF BLADDER TUMOR WITH MITOMYCIN -C N/A 07/18/2019   Procedure: TRANSURETHRAL RESECTION OF BLADDER TUMOR WITH gemcitabine ;  Surgeon: Dustin Gimenez, Holloway;  Location: [facility];  Service: Urology;  Laterality: N/A;   TRANSURETHRAL RESECTION OF BLADDER TUMOR WITH MITOMYCIN -C N/A 03/26/2020   Procedure: TRANSURETHRAL RESECTION OF BLADDER TUMOR WITH gemcitabine ;  Surgeon: Dustin Gimenez, Holloway;  Location: [facility];  Service: Urology;  Laterality: N/A;   TRANSURETHRAL RESECTION OF PROSTATE N/A 06/14/2018   Procedure: TRANSURETHRAL RESECTION OF THE PROSTATE (TURP) with Gemcitabine ;  Surgeon: Dustin Gimenez, Holloway;  Location: [facility];  Service: Urology;   Laterality: N/A;    SOCIAL HISTORY: Social History   Socioeconomic History   Marital status: Widowed    Spouse name: Not on file   Number of children: Not on file   Years of education: Not on file   Highest education level: Not on file  Occupational History   Occupation: industrial engineering/ management  Tobacco Use   Smoking status: Former    Current packs/day: 0.00    Average packs/day: 0.5 packs/day for 30.0 years (15.0 ttl pk-yrs)    Types: Cigarettes    Start date: 08/10/1948    Quit date: 08/11/1978    Years since quitting: 45.2    Passive exposure: Past   Smokeless tobacco: Never  Vaping Use   Vaping status: Never Used  Substance and Sexual Activity   Alcohol use: Not Currently    Comment: 1 BEER DAILY   Drug use: No   Sexual activity: Not Currently  Other Topics Concern   Not on file  Social History Narrative   Patient lives with his son, Kainan.  He feels safe in his home.   Very active. Goes to the [employer] regularly, works on his computer and putters around his home.   Independant   Social Drivers of Corporate investment banker Strain: Low Risk  (07/13/2023)   Received from [facility]   Overall Financial Resource Strain (CARDIA)    Difficulty of Paying Living Expenses: Not hard at all  Food Insecurity: No Food Insecurity (10/14/2023)   Hunger Vital Sign    Worried About Running Out of Food in the Last Year: Never true    Ran Out of Food in the Last Year: Never true  Transportation Needs: No Transportation Needs (10/14/2023)   PRAPARE - Administrator, Civil Service (Medical): No    Lack of Transportation (Non-Medical): No  Physical Activity: Not on file  Stress: Not on file  Social Connections: Not on file  Intimate Partner Violence: Not At Risk (10/14/2023)   Humiliation, Afraid,  Rape, and Kick questionnaire    Fear of Current or Ex-Partner: No    Emotionally Abused: No    Physically Abused: No    Sexually Abused: No    FAMILY  HISTORY: Family History  Problem Relation Age of Onset   Cancer Mother    Stomach cancer Mother    Stroke Father    Bladder Cancer Neg Hx    Kidney cancer Neg Hx    Prostate cancer Neg Hx     ALLERGIES:  is allergic to sulfa antibiotics.  MEDICATIONS:  Current Outpatient Medications  Medication Sig Dispense Refill   ASPIRIN  81 PO Take by mouth.     calcitRIOL (ROCALTROL) 0.25 MCG capsule Take 0.25 mcg by mouth.     gabapentin  (NEURONTIN ) 600 MG tablet Take 600 mg by mouth See admin instructions. Take 600 mg by mouth in the morning and afternoon and 900 mg at bedtime     latanoprost (XALATAN) 0.005 % ophthalmic solution Place 1 drop into the right eye at bedtime.     Multiple Vitamins-Minerals (CENTRUM SILVER PO) Take by mouth.     traMADol  (ULTRAM ) 50 MG tablet Take 50 mg by mouth 3 (three) times daily as needed for moderate pain.  (Patient not taking: Reported on 10/14/2023)     No current facility-administered medications for this visit.    Review of Systems  Constitutional:  Positive for fatigue. Negative for chills and fever.  HENT:   Negative for hearing loss and voice change.   Eyes:  Negative for eye problems and icterus.  Respiratory:  Negative for chest tightness, cough and shortness of breath.   Cardiovascular:  Negative for chest pain and leg swelling.  Gastrointestinal:  Negative for abdominal distention and abdominal pain.  Endocrine: Negative for hot flashes.  Genitourinary:  Negative for difficulty urinating, dysuria and frequency.   Musculoskeletal:  Negative for arthralgias.  Skin:  Negative for itching and rash.  Neurological:  Negative for light-headedness and numbness.  Hematological:  Negative for adenopathy. Does not bruise/bleed easily.  Psychiatric/Behavioral:  Negative for confusion.    PHYSICAL EXAMINATION: ECOG PERFORMANCE STATUS: 2 - Symptomatic, <50% confined to bed Vitals:   10/14/23 1126  BP: 123/62  Pulse: 66  Resp: 18  Temp: 98.3 F (36.8  C)   Filed Weights    Physical Exam Constitutional:      General: He is not in acute distress. HENT:     Head: Normocephalic and atraumatic.  Eyes:     General: No scleral icterus. Cardiovascular:     Rate and Rhythm: Normal rate and regular rhythm.     Heart sounds: Normal heart sounds.  Pulmonary:     Effort: Pulmonary effort is normal. No respiratory distress.     Breath sounds: No wheezing.  Abdominal:     General: Bowel sounds are normal. There is no distension.     Palpations: Abdomen is soft.  Musculoskeletal:        General: No deformity. Normal range of motion.     Cervical back: Normal range of motion and neck supple.  Skin:    General: Skin is warm and dry.     Findings: No erythema or rash.  Neurological:     Mental Status: He is alert and oriented to person, place, and time. Mental status is at baseline.     Cranial Nerves: No cranial nerve deficit.  Psychiatric:        Mood and Affect: Mood normal.     LABORATORY  DATA:  I have reviewed the data as listed    Latest Ref Rng & Units 10/14/2023   12:07 PM 03/27/2023    6:11 AM 03/26/2023   12:19 PM  CBC  WBC 4.0 - 10.5 K/uL 9.1  6.6  7.8   Hemoglobin 13.0 - 17.0 g/dL 7.1  16.1  09.6   Hematocrit 39.0 - 52.0 % 21.7  35.1  44.0   Platelets 150 - 400 K/uL 212  195  231       Latest Ref Rng & Units 10/14/2023   12:07 PM 03/29/2023    6:18 AM 03/28/2023    4:03 AM  CMP  Creatinine 0.61 - 1.24 mg/dL  0.45  4.09   Total Protein 6.5 - 8.1 g/dL 6.3     Total Bilirubin 0.0 - 1.2 mg/dL 0.7     Alkaline Phos 38 - 126 U/L 71     AST 15 - 41 U/L 12     ALT 0 - 44 U/L 11         RADIOGRAPHIC STUDIES: I have personally reviewed the radiological images as listed and agreed with the findings in the report. US  RENAL Result Date: 08/12/2023 CLINICAL DATA:  CKD.  History of bladder CA EXAM: RENAL / URINARY TRACT ULTRASOUND COMPLETE COMPARISON:  CT abdomen and pelvis July 25, 2014 FINDINGS: Right Kidney: Renal  measurements: 9.3 x 5.2 x 4.8 cm = volume: 121 mL. Numerous cysts in the right kidney the largest measures 2.4 x 2.7 cm and 4.3 x 3.7 x 4.7 cm in the upper parapelvic region and lower pole distribution. Left Kidney: Renal measurements: 10.6 x 5.7 x 5.6 cm = volume: 176 mL. Numerous cysts the largest in the upper pole 5.4 x 2.7 x 5.1 cm and in the mid parapelvic region 3.1 x 2.9 x 2.4 cm. Bladder: Appears normal for degree of bladder distention. Other: None. IMPRESSION: Multiple bilateral renal cortical cysts. Bosniak 1, No follow-up imaging is recommended. JACR 2018 Feb; 264-273, Management of the Incidental Renal Mass on CT, [employer] 2021; 814-848, Bosniak Classification of Cystic Renal Masses, Version 2019. Aaron Aas No hydronephrosis. Multiple bilateral renal calcifications measuring between 5 and 10 mm bilaterally correlate with chronic bilateral nephrolithiasis Electronically Signed   By: Fredrich Jefferson M.D.   On: 08/12/2023 10:09

## 2023-10-14 NOTE — Assessment & Plan Note (Addendum)
 Anemia is likely due to advanced CKD.  Rule out other etiology.  Check cbc smear, iron tibc ferritin, retic panel, B12, folate.  Lab Results  Component Value Date   HGB 7.1 (L) 10/14/2023   TIBC 258 10/14/2023   IRONPCTSAT 11 (L) 10/14/2023   FERRITIN 123 10/14/2023    I recommend IV venofer weekly x 3 to increase iron store. Goal of Ferritin is 200.  I discussed about option of continue oral iron supplementation and repeat blood work for evaluation of treatment response.  If no significant improvement, then proceed with IV Venofer treatments. Alternative option of proceed with IV Venofer treatments. I discussed about the potential risks including but not limited to allergic reactions/infusion reactions including anaphylactic reactions, diarrhea, phlebitis, high blood pressure, wheezing, SOB, skin rash, weight gain,dark urine, leg swelling, back pain, headache, nausea and fatigue, etc. Patient agrees with the plan.  We also discussed erythropoietin therapy. Rationale and side effects were reviewed with patient. He agrees with the plan.

## 2023-10-14 NOTE — Progress Notes (Signed)
 Pt here to establish care for anemia in CKD. Pt reports that he feels weak, especially in the mornings.

## 2023-10-15 LAB — HAPTOGLOBIN: Haptoglobin: 180 mg/dL (ref 38–329)

## 2023-10-15 NOTE — Telephone Encounter (Signed)
-----   Message from [name] sent at 10/14/2023 10:31 PM EDT ----- Please arrange pt to get IV Venofer weekly x 3, repeat cbc weekly x3.  Follow up in 6 weeks lab prior to MD +/- Venofer +/- Retacrit.

## 2023-10-15 NOTE — Telephone Encounter (Signed)
 Message left on VM and Mychart message sent to let pt know about Dr. Jackqueline Mason recommendation.   Please schedule and notify pt of appts:   Lab (cbc)/ venofer weekly x3 6 weeks: labs prior to MD/ venofer or retacrit

## 2023-10-22 ENCOUNTER — Other Ambulatory Visit (INDEPENDENT_AMBULATORY_CARE_PROVIDER_SITE_OTHER): Payer: Self-pay | Admitting: Vascular Surgery

## 2023-10-22 ENCOUNTER — Other Ambulatory Visit: Payer: Self-pay

## 2023-10-22 ENCOUNTER — Inpatient Hospital Stay

## 2023-10-22 ENCOUNTER — Other Ambulatory Visit: Payer: Self-pay | Admitting: Oncology

## 2023-10-22 VITALS — BP 136/59 | HR 63 | Temp 97.9°F | Resp 16

## 2023-10-22 DIAGNOSIS — N185 Chronic kidney disease, stage 5: Secondary | ICD-10-CM

## 2023-10-22 DIAGNOSIS — Z9889 Other specified postprocedural states: Secondary | ICD-10-CM

## 2023-10-22 DIAGNOSIS — D649 Anemia, unspecified: Secondary | ICD-10-CM

## 2023-10-22 LAB — PREPARE RBC (CROSSMATCH)

## 2023-10-22 LAB — CBC (CANCER CENTER ONLY)
HCT: 19.3 % — ABNORMAL LOW (ref 39.0–52.0)
Hemoglobin: 6.3 g/dL — CL (ref 13.0–17.0)
MCH: 30.6 pg (ref 26.0–34.0)
MCHC: 32.6 g/dL (ref 30.0–36.0)
MCV: 93.7 fL (ref 80.0–100.0)
Platelet Count: 244 10*3/uL (ref 150–400)
RBC: 2.06 MIL/uL — ABNORMAL LOW (ref 4.22–5.81)
RDW: 13.5 % (ref 11.5–15.5)
WBC Count: 7.1 10*3/uL (ref 4.0–10.5)
nRBC: 0 % (ref 0.0–0.2)

## 2023-10-22 MED ORDER — IRON SUCROSE 20 MG/ML IV SOLN
200.0000 mg | Freq: Once | INTRAVENOUS | Status: AC
  Administered 2023-10-22: 200 mg via INTRAVENOUS
  Filled 2023-10-22: qty 10

## 2023-10-23 ENCOUNTER — Ambulatory Visit (INDEPENDENT_AMBULATORY_CARE_PROVIDER_SITE_OTHER): Admitting: Vascular Surgery

## 2023-10-23 ENCOUNTER — Inpatient Hospital Stay

## 2023-10-23 ENCOUNTER — Encounter (INDEPENDENT_AMBULATORY_CARE_PROVIDER_SITE_OTHER): Payer: Self-pay | Admitting: Vascular Surgery

## 2023-10-23 ENCOUNTER — Ambulatory Visit (INDEPENDENT_AMBULATORY_CARE_PROVIDER_SITE_OTHER)

## 2023-10-23 VITALS — BP 117/67 | HR 80 | Ht 76.0 in | Wt 187.1 lb

## 2023-10-23 DIAGNOSIS — I10 Essential (primary) hypertension: Secondary | ICD-10-CM

## 2023-10-23 DIAGNOSIS — N185 Chronic kidney disease, stage 5: Secondary | ICD-10-CM | POA: Diagnosis not present

## 2023-10-23 DIAGNOSIS — E782 Mixed hyperlipidemia: Secondary | ICD-10-CM

## 2023-10-23 DIAGNOSIS — Z9889 Other specified postprocedural states: Secondary | ICD-10-CM

## 2023-10-23 DIAGNOSIS — I739 Peripheral vascular disease, unspecified: Secondary | ICD-10-CM

## 2023-10-23 DIAGNOSIS — D649 Anemia, unspecified: Secondary | ICD-10-CM

## 2023-10-23 DIAGNOSIS — I6523 Occlusion and stenosis of bilateral carotid arteries: Secondary | ICD-10-CM | POA: Diagnosis not present

## 2023-10-23 DIAGNOSIS — I7025 Atherosclerosis of native arteries of other extremities with ulceration: Secondary | ICD-10-CM | POA: Diagnosis not present

## 2023-10-23 DIAGNOSIS — D631 Anemia in chronic kidney disease: Secondary | ICD-10-CM

## 2023-10-23 MED ORDER — ACETAMINOPHEN 325 MG PO TABS
650.0000 mg | ORAL_TABLET | Freq: Once | ORAL | Status: AC
  Administered 2023-10-23: 650 mg via ORAL

## 2023-10-23 MED ORDER — DIPHENHYDRAMINE HCL 25 MG PO CAPS
25.0000 mg | ORAL_CAPSULE | Freq: Once | ORAL | Status: AC
  Administered 2023-10-23: 25 mg via ORAL

## 2023-10-23 MED ORDER — SODIUM CHLORIDE 0.9% IV SOLUTION
250.0000 mL | INTRAVENOUS | Status: DC
  Administered 2023-10-23: 250 mL via INTRAVENOUS
  Filled 2023-10-23: qty 250

## 2023-10-23 NOTE — Assessment & Plan Note (Signed)
 blood pressure control important in reducing the progression of atherosclerotic disease. On appropriate oral medications.

## 2023-10-23 NOTE — Progress Notes (Signed)
 MRN : [id number]  Michael Holloway is a 89 y.o. (31-May-1928) male who presents with chief complaint of  Chief Complaint  Patient presents with   LS 10.20.23 JD. ABI/LEA + consult. PVD + right foot ulcer.   Aaron Aas  History of Present Illness: Patient returns today in follow up of his PAD.  About 8 months ago, he underwent left lower extremity revascularization for nonhealing ulceration.  He has a small ulcer on his left foot residual.  It is not overly painful.  No fevers or chills.  We evaluated his perfusion with noninvasive studies today.  He has a right ABI of 1.12 and a left ABI of 1.06 with triphasic waveforms.  His digit pressure is 114 on the right and 93 on the left with excellent digital waveforms.  Duplex does not show any obvious stenosis in the left lower extremity.  Current Outpatient Medications  Medication Sig Dispense Refill   ASPIRIN  81 PO Take by mouth.     calcitRIOL (ROCALTROL) 0.25 MCG capsule Take 0.25 mcg by mouth.     gabapentin  (NEURONTIN ) 600 MG tablet Take 600 mg by mouth See admin instructions. Take 600 mg by mouth in the morning and afternoon and 900 mg at bedtime     latanoprost (XALATAN) 0.005 % ophthalmic solution Place 1 drop into the right eye at bedtime.     Multiple Vitamins-Minerals (CENTRUM SILVER PO) Take by mouth.     traMADol  (ULTRAM ) 50 MG tablet Take 50 mg by mouth 3 (three) times daily as needed for moderate pain.      No current facility-administered medications for this visit.   Facility-Administered Medications Ordered in Other Visits  Medication Dose Route Frequency Provider Last Rate Last Admin   0.9 %  sodium chloride  infusion (Manually program via Guardrails IV Fluids)  250 mL Intravenous Continuous [name], MD   Stopped at 10/23/23 1354    Past Medical History:  Diagnosis Date   Allergy    Arthritis    Bladder cancer (HCC)    Cancer (HCC)    Basal Cell Skin Cancer   Cataract    Chronic kidney disease    Stage 3 CKD   Dysrhythmia     GERD (gastroesophageal reflux disease)    Gout    History of kidney stones    Hypertension    Hypotension 05/24/2020   Neuropathy    Presence of permanent cardiac pacemaker 2020   Spinal stenosis     Past Surgical History:  Procedure Laterality Date   AMPUTATION Left 03/28/2023   Procedure: PARTIAL 5TH RAY AMPUTATION;  Surgeon: Pink Bridges, DPM;  Location: [facility];  Service: Orthopedics/Podiatry;  Laterality: Left;   BACK SURGERY     CERVICAL SPINE SURGERY      X 2   CHOLECYSTECTOMY     CYSTOSCOPY W/ RETROGRADES Bilateral 08/25/2016   Procedure: CYSTOSCOPY WITH RETROGRADE PYELOGRAM;  Surgeon: Dustin Gimenez, MD;  Location: [facility];  Service: Urology;  Laterality: Bilateral;   CYSTOSCOPY W/ RETROGRADES Bilateral 07/18/2019   Procedure: CYSTOSCOPY WITH RETROGRADE PYELOGRAM;  Surgeon: Dustin Gimenez, MD;  Location: [facility];  Service: Urology;  Laterality: Bilateral;   CYSTOSCOPY W/ RETROGRADES Bilateral 03/26/2020   Procedure: CYSTOSCOPY WITH RETROGRADE PYELOGRAM;  Surgeon: Dustin Gimenez, MD;  Location: [facility];  Service: Urology;  Laterality: Bilateral;   EYE SURGERY Bilateral    Cataract Extraction with IOL   JOINT REPLACEMENT Bilateral    total knee replacements   LOOP RECORDER INSERTION N/A 06/03/2017  Procedure: LOOP RECORDER INSERTION;  Surgeon: Percival Brace, MD;  Location: [facility];  Service: Cardiovascular;  Laterality: N/A;   LOWER EXTREMITY ANGIOGRAPHY Left 03/27/2023   Procedure: Lower Extremity Angiography;  Surgeon: Celso College, MD;  Location: [facility];  Service: Cardiovascular;  Laterality: Left;   LUMBAR LAMINECTOMY      X 4   PACEMAKER INSERTION Left 08/03/2018   Procedure: INSERTION PACEMAKER DUALCHAMBER;  Surgeon: Percival Brace, MD;  Location: [facility];  Service: Cardiovascular;  Laterality: Left;   PROSTATE SURGERY  1990s   Dr. Sullivan Endow ,in office procedure   TONSILLECTOMY     TOTAL KNEE ARTHROPLASTY Right 09/29/2019    Procedure: TOTAL KNEE ARTHROPLASTY;  Surgeon: Elner Hahn, MD;  Location: [facility];  Service: Orthopedics;  Laterality: Right;   TRANSURETHRAL RESECTION OF BLADDER TUMOR WITH MITOMYCIN -C N/A 08/25/2016   Procedure: TRANSURETHRAL RESECTION OF BLADDER TUMOR WITH MITOMYCIN -C;  Surgeon: Dustin Gimenez, MD;  Location: [facility];  Service: Urology;  Laterality: N/A;   TRANSURETHRAL RESECTION OF BLADDER TUMOR WITH MITOMYCIN -C N/A 07/18/2019   Procedure: TRANSURETHRAL RESECTION OF BLADDER TUMOR WITH gemcitabine ;  Surgeon: Dustin Gimenez, MD;  Location: [facility];  Service: Urology;  Laterality: N/A;   TRANSURETHRAL RESECTION OF BLADDER TUMOR WITH MITOMYCIN -C N/A 03/26/2020   Procedure: TRANSURETHRAL RESECTION OF BLADDER TUMOR WITH gemcitabine ;  Surgeon: Dustin Gimenez, MD;  Location: [facility];  Service: Urology;  Laterality: N/A;   TRANSURETHRAL RESECTION OF PROSTATE N/A 06/14/2018   Procedure: TRANSURETHRAL RESECTION OF THE PROSTATE (TURP) with Gemcitabine ;  Surgeon: Dustin Gimenez, MD;  Location: [facility];  Service: Urology;  Laterality: N/A;     Social History   Tobacco Use   Smoking status: Former    Current packs/day: 0.00    Average packs/day: 0.5 packs/day for 30.0 years (15.0 ttl pk-yrs)    Types: Cigarettes    Start date: 08/10/1948    Quit date: 08/11/1978    Years since quitting: 45.2    Passive exposure: Past   Smokeless tobacco: Never  Vaping Use   Vaping status: Never Used  Substance Use Topics   Alcohol use: Not Currently    Comment: 1 BEER DAILY   Drug use: No       Family History  Problem Relation Age of Onset   Cancer Mother    Stomach cancer Mother    Stroke Father    Bladder Cancer Neg Hx    Kidney cancer Neg Hx    Prostate cancer Neg Hx     Allergies  Allergen Reactions   Sulfa Antibiotics Rash     REVIEW OF SYSTEMS (Negative unless checked)   Constitutional: [] Weight loss  [] Fever  [] Chills Cardiac: [] Chest pain   [] Chest pressure   [] Palpitations    [] Shortness of breath when laying flat   [] Shortness of breath at rest   [] Shortness of breath with exertion. Vascular:  [] Pain in legs with walking   [] Pain in legs at rest   [] Pain in legs when laying flat   [] Claudication   [] Pain in feet when walking  [x] Pain in feet at rest  [] Pain in feet when laying flat   [] History of DVT   [] Phlebitis   [] Swelling in legs   [] Varicose veins   [x] Non-healing ulcers Pulmonary:   [] Uses home oxygen   [] Productive cough   [] Hemoptysis   [] Wheeze  [] COPD   [] Asthma Neurologic:  [] Dizziness  [] Blackouts   [] Seizures   [] History of stroke   [] History of TIA  []   Aphasia   [] Temporary blindness   [] Dysphagia   [] Weakness or numbness in arms   [] Weakness or numbness in legs Musculoskeletal:  [x] Arthritis   [] Joint swelling   [x] Joint pain   [] Low back pain Hematologic:  [] Easy bruising  [] Easy bleeding   [] Hypercoagulable state   [] Anemic  [] Hepatitis Gastrointestinal:  [] Blood in stool   [] Vomiting blood  [x] Gastroesophageal reflux/heartburn   [] Difficulty swallowing. Genitourinary:  [x] Chronic kidney disease   [] Difficult urination  [] Frequent urination  [] Burning with urination   [] Blood in urine Skin:  [] Rashes   [x] Ulcers   [x] Wounds Psychological:  [] History of anxiety   []  History of major depression.  Physical Examination  BP 117/67   Pulse 80   Ht 6' 4 (1.93 m)   Wt 187 lb 2 oz (84.9 kg)   BMI 22.78 kg/m  Gen:  WD/WN, NAD. Appears younger than stated age. Head: [place]/AT, No temporalis wasting. Ear/Nose/Throat: Hearing grossly intact, nares w/o erythema or drainage Eyes: Conjunctiva clear. Sclera non-icteric Neck: Supple.  Trachea midline Pulmonary:  Good air movement, no use of accessory muscles.  Cardiac: irregular Vascular:  Vessel Right Left  Radial Palpable Palpable                          PT Palpable Palpable  DP Palpable Palpable   Gastrointestinal: soft, non-tender/non-distended. No guarding/reflex.  Musculoskeletal: M/S 5/5  throughout.  No deformity or atrophy. 1+ BLE edema. Neurologic: Sensation grossly intact in extremities.  Symmetrical.  Speech is fluent.  Psychiatric: Judgment intact, Mood & affect appropriate for pt's clinical situation. Dermatologic: left foot wound is dressed      Labs Recent Results (from the past 2160 hours)  Technologist smear review     Status: None   Collection Time: 10/14/23 12:07 PM  Result Value Ref Range   WBC MORPHOLOGY MORPHOLOGY UNREMARKABLE    RBC MORPHOLOGY OVALOCYTES    Plt Morphology PLATELETS APPEAR ADEQUATE     Comment: Normal platelet morphology   Clinical Information anemia     Comment: Performed at [facility], [address] [place], [place] [address]  Hold Tube- Blood Bank     Status: None   Collection Time: 10/14/23 12:07 PM  Result Value Ref Range   Blood Bank Specimen SAMPLE AVAILABLE FOR TESTING    Sample Expiration      10/17/2023,2359 Performed at [facility], [address] [place], [place] [address]   Hepatic function panel     Status: Abnormal   Collection Time: 10/14/23 12:07 PM  Result Value Ref Range   Total Protein 6.3 (L) 6.5 - 8.1 g/dL   Albumin 3.6 3.5 - 5.0 g/dL   AST 12 (L) 15 - 41 U/L   ALT 11 0 - 44 U/L   Alkaline Phosphatase 71 38 - 126 U/L   Total Bilirubin 0.7 0.0 - 1.2 mg/dL   Bilirubin, Direct <0.9 0.0 - 0.2 mg/dL   Indirect Bilirubin NOT CALCULATED 0.3 - 0.9 mg/dL    Comment: Performed at [facility], [address] [place], [place] [address]  Haptoglobin     Status: None   Collection Time: 10/14/23 12:07 PM  Result Value Ref Range   Haptoglobin 180 38 - 329 mg/dL    Comment: (NOTE) Performed At: [facility] [address] [place], [place] [id number] Pearlean Botts MD [id number]   Lactate dehydrogenase     Status: Abnormal   Collection Time: 10/14/23 12:07 PM  Result  Value Ref Range   LDH 70 (L) 98 - 192 U/L    Comment: Performed at [facility], [address] [place], [place] [address]  Retic Panel     Status: Abnormal   Collection Time: 10/14/23 12:07 PM  Result Value Ref Range   Retic Ct Pct 1.4 0.4 - 3.1 %   RBC. 2.32 (L) 4.22 - 5.81 MIL/uL   Retic Count, Absolute 31.6 19.0 - 186.0 K/uL   Immature Retic Fract 10.5 2.3 - 15.9 %   Reticulocyte Hemoglobin 31.7 >27.9 pg    Comment: Performed at [facility], [address] [place], [place] [address]  CBC with Differential/Platelet     Status: Abnormal   Collection Time: 10/14/23 12:07 PM  Result Value Ref Range   WBC 9.1 4.0 - 10.5 K/uL   RBC 2.32 (L) 4.22 - 5.81 MIL/uL   Hemoglobin 7.1 (L) 13.0 - 17.0 g/dL   HCT 29.5 (L) 62.1 - 30.8 %   MCV 93.5 80.0 - 100.0 fL   MCH 30.6 26.0 - 34.0 pg   MCHC 32.7 30.0 - 36.0 g/dL   RDW 65.7 84.6 - 96.2 %   Platelets 212 150 - 400 K/uL   nRBC 0.0 0.0 - 0.2 %   Neutrophils Relative % 81 %   Neutro Abs 7.3 1.7 - 7.7 K/uL   Lymphocytes Relative 8 %   Lymphs Abs 0.8 0.7 - 4.0 K/uL   Monocytes Relative 10 %   Monocytes Absolute 0.9 0.1 - 1.0 K/uL   Eosinophils Relative 1 %   Eosinophils Absolute 0.1 0.0 - 0.5 K/uL   Basophils Relative 0 %   Basophils Absolute 0.0 0.0 - 0.1 K/uL   Immature Granulocytes 0 %   Abs Immature Granulocytes 0.03 0.00 - 0.07 K/uL    Comment: Performed at [facility], [address] [place], [place] [address]  Ferritin     Status: None   Collection Time: 10/14/23 12:07 PM  Result Value Ref Range   Ferritin 123 24 - 336 ng/mL    Comment: Performed at [facility], [address] [place], [place] [address]  Iron  and TIBC     Status: Abnormal   Collection Time: 10/14/23 12:07 PM  Result Value Ref Range   Iron  28 (L) 45 - 182 ug/dL   TIBC 010 272 - 536 ug/dL   Saturation Ratios 11 (L) 17.9 - 39.5 %   UIBC 230 ug/dL    Comment: Performed at [facility], [address] [place], [place] [address]  Folate     Status: None   Collection Time: 10/14/23 12:07 PM  Result Value Ref Range    Folate >40.0 >5.9 ng/mL    Comment: Performed at [facility], [address] [place], [place] [address]  Vitamin B12     Status: None   Collection Time: 10/14/23 12:07 PM  Result Value Ref Range   Vitamin B-12 520 180 - 914 pg/mL    Comment: (NOTE) This assay is not validated for testing neonatal or myeloproliferative syndrome specimens for Vitamin B12 levels. Performed at [facility], [address] [place], [place] [address]   CBC (Cancer Center Only)     Status: Abnormal   Collection Time: 10/22/23 11:18 AM  Result Value Ref Range   WBC Count 7.1 4.0 - 10.5 K/uL   RBC 2.06 (L) 4.22 - 5.81 MIL/uL   Hemoglobin 6.3 (LL) 13.0 - 17.0 g/dL  Comment: REPEATED TO VERIFY THIS CRITICAL RESULT HAS VERIFIED AND BEEN CALLED TO [facility],E BY CHASE ISLEY ON 06 12 2025 AT 1136, AND HAS BEEN READ BACK.  THIS CRITICAL RESULT HAS VERIFIED AND BEEN CALLED TO SANTOS,E BY CHASE ISLEY ON 06 12 2025 AT 1137, AND HAS BEEN READ BACK.     HCT 19.3 (L) 39.0 - 52.0 %   MCV 93.7 80.0 - 100.0 fL   MCH 30.6 26.0 - 34.0 pg   MCHC 32.6 30.0 - 36.0 g/dL   RDW 16.1 09.6 - 04.5 %   Platelet Count 244 150 - 400 K/uL   nRBC 0.0 0.0 - 0.2 %    Comment: Performed at [facility], [address] [place], [place] [address]  Type and screen     Status: None (Preliminary result)   Collection Time: 10/22/23 11:57 AM  Result Value Ref Range   ABO/RH(D) B POS    Antibody Screen NEG    Sample Expiration 10/25/2023,2359    Unit Number [id number]    Blood Component Type RED CELLS,LR    Unit division 00    Status of Unit ISSUED    Transfusion Status OK TO TRANSFUSE    Crossmatch Result      Compatible Performed at [facility], [address] [place], [place] [address]   Prepare RBC (crossmatch)     Status: None   Collection Time: 10/22/23 11:57 AM  Result Value Ref Range   Order Confirmation      ORDER PROCESSED BY BLOOD BANK Performed at [facility], [address] [place], [place] [address]   BPAM RBC     Status: None (Preliminary result)   Collection Time: 10/22/23 11:57 AM  Result Value Ref Range   ISSUE DATE / TIME [id number]    Blood Product Unit Number [id number]    PRODUCT CODE [id number]    Unit Type and Rh 5100    Blood Product Expiration Date [id number]     Radiology No results found.  Assessment/Plan  Benign essential hypertension blood pressure control important in reducing the progression of atherosclerotic disease. On appropriate oral medications.   Carotid stenosis Was mild at last check in 2023. Can be checked again at his convenience.  Hyperlipidemia lipid control important in reducing the progression of atherosclerotic disease. Continue statin therapy   Atherosclerosis of native arteries of the extremities with ulceration (HCC) He has a right ABI of 1.12 and a left ABI of 1.06 with triphasic waveforms.  His digit pressure is 114 on the right and 93 on the left with excellent digital waveforms.  Duplex does not show any obvious stenosis in the left lower extremity.  Perfusion is currently excellent.  Should be adequate for wound healing or any further procedures that need to be done.  Recheck in 6 months with noninvasive studies.    Mikki Alexander, MD  10/23/2023 2:35 PM    This note was created with Dragon medical transcription system.  Any errors from dictation are purely unintentional

## 2023-10-23 NOTE — Assessment & Plan Note (Signed)
 Was mild at last check in 2023. Can be checked again at his convenience.

## 2023-10-23 NOTE — Assessment & Plan Note (Signed)
 He has a right ABI of 1.12 and a left ABI of 1.06 with triphasic waveforms.  His digit pressure is 114 on the right and 93 on the left with excellent digital waveforms.  Duplex does not show any obvious stenosis in the left lower extremity.  Perfusion is currently excellent.  Should be adequate for wound healing or any further procedures that need to be done.  Recheck in 6 months with noninvasive studies.

## 2023-10-23 NOTE — Assessment & Plan Note (Signed)
 lipid control important in reducing the progression of atherosclerotic disease. Continue statin therapy

## 2023-10-24 LAB — BPAM RBC
Blood Product Expiration Date: 202507092359
ISSUE DATE / TIME: 202506131131
Unit Type and Rh: 5100

## 2023-10-24 LAB — TYPE AND SCREEN
ABO/RH(D): B POS
Antibody Screen: NEGATIVE
Unit division: 0

## 2023-10-26 LAB — VAS US ABI WITH/WO TBI
Left ABI: 1.06
Right ABI: 1.12

## 2023-10-28 ENCOUNTER — Ambulatory Visit
Admission: RE | Admit: 2023-10-28 | Discharge: 2023-10-28 | Disposition: A | Discharge status: Home / Self Care | Source: Ambulatory Visit | Attending: Nephrology | Admitting: Nephrology

## 2023-10-28 ENCOUNTER — Ambulatory Visit
Admission: RE | Admit: 2023-10-28 | Discharge: 2023-10-28 | Disposition: A | Discharge status: Home / Self Care | Attending: Nephrology | Admitting: Nephrology

## 2023-10-28 DIAGNOSIS — R7612 Nonspecific reaction to cell mediated immunity measurement of gamma interferon antigen response without active tuberculosis: Secondary | ICD-10-CM

## 2023-10-29 ENCOUNTER — Other Ambulatory Visit: Payer: Self-pay

## 2023-10-29 ENCOUNTER — Inpatient Hospital Stay

## 2023-10-29 ENCOUNTER — Telehealth: Payer: Self-pay

## 2023-10-29 ENCOUNTER — Other Ambulatory Visit: Payer: Self-pay | Admitting: Oncology

## 2023-10-29 VITALS — BP 127/61 | HR 82 | Temp 97.1°F | Resp 18

## 2023-10-29 DIAGNOSIS — D649 Anemia, unspecified: Secondary | ICD-10-CM

## 2023-10-29 DIAGNOSIS — N185 Chronic kidney disease, stage 5: Secondary | ICD-10-CM

## 2023-10-29 DIAGNOSIS — D631 Anemia in chronic kidney disease: Secondary | ICD-10-CM

## 2023-10-29 LAB — CBC (CANCER CENTER ONLY)
HCT: 20.9 % — ABNORMAL LOW (ref 39.0–52.0)
Hemoglobin: 6.7 g/dL — CL (ref 13.0–17.0)
MCH: 30.2 pg (ref 26.0–34.0)
MCHC: 32.1 g/dL (ref 30.0–36.0)
MCV: 94.1 fL (ref 80.0–100.0)
Platelet Count: 264 10*3/uL (ref 150–400)
RBC: 2.22 MIL/uL — ABNORMAL LOW (ref 4.22–5.81)
RDW: 13.9 % (ref 11.5–15.5)
WBC Count: 7.8 10*3/uL (ref 4.0–10.5)
nRBC: 0 % (ref 0.0–0.2)

## 2023-10-29 LAB — PREPARE RBC (CROSSMATCH)

## 2023-10-29 MED ORDER — IRON SUCROSE 20 MG/ML IV SOLN
200.0000 mg | Freq: Once | INTRAVENOUS | Status: AC
  Administered 2023-10-29: 200 mg via INTRAVENOUS
  Filled 2023-10-29: qty 10

## 2023-10-29 NOTE — Telephone Encounter (Signed)
 Received critical lab from Micheline Ahr at [facility] lab. Hemoglobin 6.7. Dr. Wilhelmenia Harada notified. Recommends blood transfusion -1 unit.  The following updates have been made to future appts:   Blood transfusion on 6/20 Retacrit on 6/23 add possible blood transfusion on 6/27  New AVS will be printed by infusion nurse.

## 2023-10-30 ENCOUNTER — Inpatient Hospital Stay

## 2023-10-30 DIAGNOSIS — N185 Chronic kidney disease, stage 5: Secondary | ICD-10-CM | POA: Diagnosis not present

## 2023-10-30 DIAGNOSIS — D649 Anemia, unspecified: Secondary | ICD-10-CM

## 2023-10-30 DIAGNOSIS — D631 Anemia in chronic kidney disease: Secondary | ICD-10-CM

## 2023-10-30 MED ORDER — DIPHENHYDRAMINE HCL 25 MG PO CAPS
25.0000 mg | ORAL_CAPSULE | Freq: Once | ORAL | Status: AC
  Administered 2023-10-30: 25 mg via ORAL
  Filled 2023-10-30: qty 1

## 2023-10-30 MED ORDER — FUROSEMIDE 10 MG/ML IJ SOLN
20.0000 mg | Freq: Once | INTRAMUSCULAR | Status: AC
  Administered 2023-10-30: 20 mg via INTRAVENOUS
  Filled 2023-10-30: qty 2

## 2023-10-30 MED ORDER — SODIUM CHLORIDE 0.9% IV SOLUTION
250.0000 mL | INTRAVENOUS | Status: DC
  Administered 2023-10-30: 100 mL via INTRAVENOUS
  Filled 2023-10-30: qty 250

## 2023-10-30 MED ORDER — ACETAMINOPHEN 325 MG PO TABS
650.0000 mg | ORAL_TABLET | Freq: Once | ORAL | Status: AC
  Administered 2023-10-30: 650 mg via ORAL
  Filled 2023-10-30: qty 2

## 2023-10-31 LAB — TYPE AND SCREEN
ABO/RH(D): B POS
Antibody Screen: NEGATIVE
Unit division: 0

## 2023-10-31 LAB — BPAM RBC
Blood Product Expiration Date: 202507142359
ISSUE DATE / TIME: 202506201117
Unit Type and Rh: 7300

## 2023-11-02 ENCOUNTER — Inpatient Hospital Stay

## 2023-11-02 VITALS — BP 105/60

## 2023-11-02 DIAGNOSIS — N185 Chronic kidney disease, stage 5: Secondary | ICD-10-CM | POA: Diagnosis not present

## 2023-11-02 DIAGNOSIS — D631 Anemia in chronic kidney disease: Secondary | ICD-10-CM

## 2023-11-02 MED ORDER — EPOETIN ALFA-EPBX 40000 UNIT/ML IJ SOLN
40000.0000 [IU] | Freq: Once | INTRAMUSCULAR | Status: AC
  Administered 2023-11-02: 40000 [IU] via SUBCUTANEOUS
  Filled 2023-11-02: qty 1

## 2023-11-05 ENCOUNTER — Other Ambulatory Visit: Payer: Self-pay

## 2023-11-05 ENCOUNTER — Inpatient Hospital Stay

## 2023-11-05 ENCOUNTER — Other Ambulatory Visit: Payer: Self-pay | Admitting: Oncology

## 2023-11-05 ENCOUNTER — Telehealth: Payer: Self-pay

## 2023-11-05 VITALS — BP 129/67 | HR 64 | Temp 95.2°F | Resp 18

## 2023-11-05 DIAGNOSIS — N185 Chronic kidney disease, stage 5: Secondary | ICD-10-CM | POA: Diagnosis not present

## 2023-11-05 DIAGNOSIS — D649 Anemia, unspecified: Secondary | ICD-10-CM

## 2023-11-05 DIAGNOSIS — D631 Anemia in chronic kidney disease: Secondary | ICD-10-CM

## 2023-11-05 LAB — CBC WITH DIFFERENTIAL (CANCER CENTER ONLY)
Abs Immature Granulocytes: 0.04 10*3/uL (ref 0.00–0.07)
Basophils Absolute: 0 10*3/uL (ref 0.0–0.1)
Basophils Relative: 0 %
Eosinophils Absolute: 0.2 10*3/uL (ref 0.0–0.5)
Eosinophils Relative: 3 %
HCT: 24.3 % — ABNORMAL LOW (ref 39.0–52.0)
Hemoglobin: 7.9 g/dL — ABNORMAL LOW (ref 13.0–17.0)
Immature Granulocytes: 1 %
Lymphocytes Relative: 11 %
Lymphs Abs: 0.7 10*3/uL (ref 0.7–4.0)
MCH: 31 pg (ref 26.0–34.0)
MCHC: 32.5 g/dL (ref 30.0–36.0)
MCV: 95.3 fL (ref 80.0–100.0)
Monocytes Absolute: 0.7 10*3/uL (ref 0.1–1.0)
Monocytes Relative: 11 %
Neutro Abs: 5.1 10*3/uL (ref 1.7–7.7)
Neutrophils Relative %: 74 %
Platelet Count: 245 10*3/uL (ref 150–400)
RBC: 2.55 MIL/uL — ABNORMAL LOW (ref 4.22–5.81)
RDW: 14.1 % (ref 11.5–15.5)
WBC Count: 6.8 10*3/uL (ref 4.0–10.5)
nRBC: 0 % (ref 0.0–0.2)

## 2023-11-05 LAB — SAMPLE TO BLOOD BANK

## 2023-11-05 MED ORDER — IRON SUCROSE 20 MG/ML IV SOLN
200.0000 mg | Freq: Once | INTRAVENOUS | Status: AC
  Administered 2023-11-05: 200 mg via INTRAVENOUS
  Filled 2023-11-05: qty 10

## 2023-11-05 MED ORDER — SODIUM CHLORIDE 0.9% FLUSH
10.0000 mL | Freq: Once | INTRAVENOUS | Status: AC | PRN
  Administered 2023-11-05: 10 mL
  Filled 2023-11-05: qty 10

## 2023-11-05 NOTE — Telephone Encounter (Signed)
 Pt in clinic for venofer  today. Hemoglobin 7.9. Per infusion RN pt reports fatigue and weakness in the legs. Per Dr. Babara, no blood on 6/27, schedule retacrit instead. Schedule pt for lab/ retacrit ( next week)

## 2023-11-06 ENCOUNTER — Inpatient Hospital Stay

## 2023-11-06 VITALS — BP 124/70

## 2023-11-06 DIAGNOSIS — N185 Chronic kidney disease, stage 5: Secondary | ICD-10-CM | POA: Diagnosis not present

## 2023-11-06 DIAGNOSIS — D631 Anemia in chronic kidney disease: Secondary | ICD-10-CM

## 2023-11-06 MED ORDER — EPOETIN ALFA-EPBX 40000 UNIT/ML IJ SOLN
40000.0000 [IU] | Freq: Once | INTRAMUSCULAR | Status: AC
  Administered 2023-11-06: 40000 [IU] via SUBCUTANEOUS
  Filled 2023-11-06: qty 1

## 2023-11-12 ENCOUNTER — Inpatient Hospital Stay: Attending: Oncology

## 2023-11-12 ENCOUNTER — Ambulatory Visit

## 2023-11-12 VITALS — BP 121/62 | HR 62 | Temp 97.0°F | Resp 18

## 2023-11-12 DIAGNOSIS — Z8551 Personal history of malignant neoplasm of bladder: Secondary | ICD-10-CM | POA: Diagnosis not present

## 2023-11-12 DIAGNOSIS — D631 Anemia in chronic kidney disease: Secondary | ICD-10-CM | POA: Diagnosis present

## 2023-11-12 DIAGNOSIS — N185 Chronic kidney disease, stage 5: Secondary | ICD-10-CM | POA: Diagnosis present

## 2023-11-12 DIAGNOSIS — Z87891 Personal history of nicotine dependence: Secondary | ICD-10-CM | POA: Insufficient documentation

## 2023-11-12 LAB — HEMOGLOBIN AND HEMATOCRIT, BLOOD
HCT: 26.2 % — ABNORMAL LOW (ref 39.0–52.0)
Hemoglobin: 8.3 g/dL — ABNORMAL LOW (ref 13.0–17.0)

## 2023-11-12 LAB — SAMPLE TO BLOOD BANK

## 2023-11-12 MED ORDER — EPOETIN ALFA-EPBX 40000 UNIT/ML IJ SOLN
40000.0000 [IU] | Freq: Once | INTRAMUSCULAR | Status: AC
  Administered 2023-11-12: 40000 [IU] via SUBCUTANEOUS
  Filled 2023-11-12: qty 1

## 2023-11-17 DIAGNOSIS — I120 Hypertensive chronic kidney disease with stage 5 chronic kidney disease or end stage renal disease: Secondary | ICD-10-CM | POA: Insufficient documentation

## 2023-11-25 ENCOUNTER — Inpatient Hospital Stay

## 2023-11-25 DIAGNOSIS — D631 Anemia in chronic kidney disease: Secondary | ICD-10-CM

## 2023-11-25 DIAGNOSIS — N185 Chronic kidney disease, stage 5: Secondary | ICD-10-CM | POA: Diagnosis not present

## 2023-11-25 DIAGNOSIS — D649 Anemia, unspecified: Secondary | ICD-10-CM

## 2023-11-25 LAB — RETIC PANEL
Immature Retic Fract: 13.8 % (ref 2.3–15.9)
RBC.: 3.3 MIL/uL — ABNORMAL LOW (ref 4.22–5.81)
Retic Count, Absolute: 51 K/uL (ref 19.0–186.0)
Retic Ct Pct: 1.6 % (ref 0.4–3.1)
Reticulocyte Hemoglobin: 24.1 pg — ABNORMAL LOW (ref >27.9)

## 2023-11-25 LAB — CBC (CANCER CENTER ONLY)
HCT: 32.8 % — ABNORMAL LOW (ref 39.0–52.0)
Hemoglobin: 9.9 g/dL — ABNORMAL LOW (ref 13.0–17.0)
MCH: 29.3 pg (ref 26.0–34.0)
MCHC: 30.2 g/dL (ref 30.0–36.0)
MCV: 97 fL (ref 80.0–100.0)
Platelet Count: 222 K/uL (ref 150–400)
RBC: 3.38 MIL/uL — ABNORMAL LOW (ref 4.22–5.81)
RDW: 14.6 % (ref 11.5–15.5)
WBC Count: 6.9 K/uL (ref 4.0–10.5)
nRBC: 0 % (ref 0.0–0.2)

## 2023-11-25 LAB — SAMPLE TO BLOOD BANK

## 2023-11-25 LAB — FERRITIN: Ferritin: 119 ng/mL (ref 24–336)

## 2023-11-25 LAB — IRON AND TIBC
Iron: 32 ug/dL — ABNORMAL LOW (ref 45–182)
Saturation Ratios: 19 % (ref 17.9–39.5)
TIBC: 167 ug/dL — ABNORMAL LOW (ref 250–450)
UIBC: 135 ug/dL

## 2023-11-30 NOTE — Progress Notes (Signed)
 Assessment/Plan: Patient is s/p laparoscopic peritoneal dialysis catheter placement, omentopexy, 2 weeks ago. Doing well overall. Pictures of operation are in epic, under the media tab, listed as clinical image, or under procedure, with hyperlink. All questions answered. Catheter being flushed, working no issues.  Okay to start dialysis when nephrology sees fit.  No restrictions. Avoid constipation, discussed bowel regimen. Sutures removed today at bedside, Steri-Strips placed, these can be removed in 7 to 10 days.   From my standpoint no acute surgical issues, follow-up on an as-needed basis.    Subjective: Patient comes to clinic today for post-operative visit.  They are 2 weeks out from peritoneal dialysis catheter placement. Doing well overall. Pain is improving/resolved.  Tolerating diet without nausea or vomiting. Regular bowel movements. Denies fever or chills.   The patient has been seen in the dialysis center and catheter has been flushed, without issue.  Physical Exam: General: NAD Abdomen: Soft. Non-distended. Non-tender. Incisions C/D/I.  No evidence of infection.  PD catheter in place no issues, no site of infection Extremities: No clubbing, cyanosis or edema

## 2023-12-02 ENCOUNTER — Inpatient Hospital Stay: Admitting: Oncology

## 2023-12-02 ENCOUNTER — Inpatient Hospital Stay

## 2023-12-02 ENCOUNTER — Encounter: Payer: Self-pay | Admitting: Oncology

## 2023-12-02 VITALS — BP 138/61 | HR 62

## 2023-12-02 VITALS — BP 107/54 | HR 65 | Temp 97.6°F | Resp 18 | Ht 76.0 in | Wt 180.0 lb

## 2023-12-02 DIAGNOSIS — D631 Anemia in chronic kidney disease: Secondary | ICD-10-CM

## 2023-12-02 DIAGNOSIS — K5900 Constipation, unspecified: Secondary | ICD-10-CM | POA: Diagnosis not present

## 2023-12-02 DIAGNOSIS — N185 Chronic kidney disease, stage 5: Secondary | ICD-10-CM

## 2023-12-02 MED ORDER — IRON SUCROSE 20 MG/ML IV SOLN
200.0000 mg | Freq: Once | INTRAVENOUS | Status: AC
  Administered 2023-12-02: 200 mg via INTRAVENOUS
  Filled 2023-12-02: qty 10

## 2023-12-02 MED ORDER — SODIUM CHLORIDE 0.9% FLUSH
10.0000 mL | Freq: Once | INTRAVENOUS | Status: AC | PRN
  Administered 2023-12-02: 10 mL
  Filled 2023-12-02: qty 10

## 2023-12-02 NOTE — Progress Notes (Signed)
 Hematology/Oncology Consult note [phone] [phone]        REFERRING PROVIDER: Epifanio Alm SQUIBB, MD   CHIEF COMPLAINTS/REASON FOR VISIT:  anemia due to CKD   ASSESSMENT & PLAN:   Anemia in chronic kidney disease (CKD) Anemia is likely due to advanced CKD.   Lab Results  Component Value Date   HGB 9.9 (L) 11/25/2023   TIBC 167 (L) 11/25/2023   IRONPCTSAT 19 11/25/2023   FERRITIN 119 11/25/2023    I recommend IV venofer  weekly x 3 to further increase iron  store. Goal of Ferritin is 200.  Hemoglobin has improved after erythropoietin replacement therapy.  Hemoglobin 9.9 today very close to goal of 10.  I will hold off EPO.  Continue H&H every 4 weeks and erythropoietin replacement if hemoglobin less than 10.  Constipation Recommend patient to take Colace 100 mg 1-2 times daily.  MiraLAX 17 g daily as needed  Orders Placed This Encounter  Procedures   CBC with Differential (Cancer Center Only)    Standing Status:   Future    Expected Date:   04/03/2024    Expiration Date:   07/02/2024   Iron  and TIBC    Standing Status:   Future    Expected Date:   04/03/2024    Expiration Date:   07/02/2024   Ferritin    Standing Status:   Future    Expected Date:   04/03/2024    Expiration Date:   07/02/2024   Hemoglobin and hematocrit, blood    Standing Status:   Standing    Number of Occurrences:   3    Expiration Date:   12/01/2024    All questions were answered. The patient knows to call the clinic with any problems, questions or concerns.  Zelphia Cap, MD, PhD [facility] Hematology Oncology 12/02/2023   HISTORY OF PRESENTING ILLNESS:   Michael Holloway is a  89 y.o.  male with PMH listed below was seen in consultation at the request of  Epifanio Alm SQUIBB, MD  for evaluation of anemia due to chronic kidney disease.  Patient has a history of recurrent noninvasive bladder cancer. 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 Muscularis propria present and uninvolved.  Given that patient has had high frequency and recurrence despite induction BCG, patient received intravesical gemcitabine  weekly x 6 to help to reduce the frequency and severity of recurrence.  Patient presents to reestablish care today for anemia. He has a history of chronic kidney disease, most recent creatinine was 5.48, GFR 10. He had acute kidney injury secondary to IV contrast exposure in October 2024 with limited recovery.  Patient follows up with nephrologist. Previous protein electrophoresis showed no M protein. Patient has been recommended for peritoneal dialysis.  He was referred to establish care with surgery for peritoneal dialysis catheter evaluation.  Patient had hemoglobin level 11 in November 2024.  He had a rapid decline of hemoglobin to 7.2 on most recent blood work on 10/01/2023.  He denies any rectal bleeding, dark stool.  Denies any abdominal pain.  He feels very weak.   INTERVAL HISTORY Michael Holloway is a 89 y.o. male who has above history reviewed by me today presents for follow up visit for anemia due to chronic kidney disease  Today patient was accompanied by his son Michael Holloway. Fatigue level has improved.  He tolerated IV Venofer  treatments in the past.  MEDICAL HISTORY:  Past Medical History:  Diagnosis Date   Allergy  Arthritis    Bladder cancer (HCC)    Cancer (HCC)    Basal Cell Skin Cancer   Cataract    Chronic kidney disease    Stage 3 CKD   Dysrhythmia    GERD (gastroesophageal reflux disease)    Gout    History of kidney stones    Hypertension    Hypotension 05/24/2020   Neuropathy    Presence of permanent cardiac pacemaker 2020   Spinal stenosis     SURGICAL HISTORY: Past Surgical History:  Procedure Laterality Date   AMPUTATION Left 03/28/2023   Procedure: PARTIAL 5TH RAY AMPUTATION;  Surgeon: Lennie Barter, DPM;  Location: [facility];  Service: Orthopedics/Podiatry;  Laterality: Left;    BACK SURGERY     CERVICAL SPINE SURGERY      X 2   CHOLECYSTECTOMY     CYSTOSCOPY W/ RETROGRADES Bilateral 08/25/2016   Procedure: CYSTOSCOPY WITH RETROGRADE PYELOGRAM;  Surgeon: Rosina Riis, MD;  Location: [facility];  Service: Urology;  Laterality: Bilateral;   CYSTOSCOPY W/ RETROGRADES Bilateral 07/18/2019   Procedure: CYSTOSCOPY WITH RETROGRADE PYELOGRAM;  Surgeon: Riis Rosina, MD;  Location: [facility];  Service: Urology;  Laterality: Bilateral;   CYSTOSCOPY W/ RETROGRADES Bilateral 03/26/2020   Procedure: CYSTOSCOPY WITH RETROGRADE PYELOGRAM;  Surgeon: Riis Rosina, MD;  Location: [facility];  Service: Urology;  Laterality: Bilateral;   EYE SURGERY Bilateral    Cataract Extraction with IOL   JOINT REPLACEMENT Bilateral    total knee replacements   LOOP RECORDER INSERTION N/A 06/03/2017   Procedure: LOOP RECORDER INSERTION;  Surgeon: Ammon Blunt, MD;  Location: [facility];  Service: Cardiovascular;  Laterality: N/A;   LOWER EXTREMITY ANGIOGRAPHY Left 03/27/2023   Procedure: Lower Extremity Angiography;  Surgeon: Marea Selinda RAMAN, MD;  Location: [facility];  Service: Cardiovascular;  Laterality: Left;   LUMBAR LAMINECTOMY      X 4   PACEMAKER INSERTION Left 08/03/2018   Procedure: INSERTION PACEMAKER DUALCHAMBER;  Surgeon: Ammon Blunt, MD;  Location: [facility];  Service: Cardiovascular;  Laterality: Left;   PROSTATE SURGERY  1990s   Dr. Gala ,in office procedure   TONSILLECTOMY     TOTAL KNEE ARTHROPLASTY Right 09/29/2019   Procedure: TOTAL KNEE ARTHROPLASTY;  Surgeon: Edie Norleen PARAS, MD;  Location: [facility];  Service: Orthopedics;  Laterality: Right;   TRANSURETHRAL RESECTION OF BLADDER TUMOR WITH MITOMYCIN -C N/A 08/25/2016   Procedure: TRANSURETHRAL RESECTION OF BLADDER TUMOR WITH MITOMYCIN -C;  Surgeon: Rosina Riis, MD;  Location: [facility];  Service: Urology;  Laterality: N/A;   TRANSURETHRAL RESECTION OF BLADDER TUMOR WITH MITOMYCIN -C N/A 07/18/2019    Procedure: TRANSURETHRAL RESECTION OF BLADDER TUMOR WITH gemcitabine ;  Surgeon: Riis Rosina, MD;  Location: [facility];  Service: Urology;  Laterality: N/A;   TRANSURETHRAL RESECTION OF BLADDER TUMOR WITH MITOMYCIN -C N/A 03/26/2020   Procedure: TRANSURETHRAL RESECTION OF BLADDER TUMOR WITH gemcitabine ;  Surgeon: Riis Rosina, MD;  Location: [facility];  Service: Urology;  Laterality: N/A;   TRANSURETHRAL RESECTION OF PROSTATE N/A 06/14/2018   Procedure: TRANSURETHRAL RESECTION OF THE PROSTATE (TURP) with Gemcitabine ;  Surgeon: Riis Rosina, MD;  Location: [facility];  Service: Urology;  Laterality: N/A;    SOCIAL HISTORY: Social History   Socioeconomic History   Marital status: Widowed    Spouse name: Not on file   Number of children: Not on file   Years of education: Not on file   Highest education level: Not on file  Occupational History   Occupation: Scientist, research (medical)  Tobacco Use   Smoking status: Former    Current packs/day: 0.00    Average packs/day: 0.5 packs/day for 30.0 years (15.0 ttl pk-yrs)    Types: Cigarettes    Start date: 08/10/1948    Quit date: 08/11/1978    Years since quitting: 45.3    Passive exposure: Past   Smokeless tobacco: Never  Vaping Use   Vaping status: Never Used  Substance and Sexual Activity   Alcohol use: Not Currently    Comment: 1 BEER DAILY   Drug use: No   Sexual activity: Not Currently  Other Topics Concern   Not on file  Social History Narrative   Patient lives with his son, Michael Holloway.  He feels safe in his home.   Very active. Goes to the [employer] regularly, works on his computer and putters around his home.   Independant   Social Drivers of Health   Financial Resource Strain: Patient Declined (11/30/2023)   Received from [facility]   Overall Financial Resource Strain (CARDIA)    How hard is it for you to pay for the very basics like food, housing, medical care, and heating?: Patient declined  Food Insecurity:  Patient Declined (11/30/2023)   Received from [facility]   Hunger Vital Sign    Within the past 12 months, you worried that your food would run out before you got the money to buy more.: Patient declined    Within the past 12 months, the food you bought just didn't last and you didn't have money to get more.: Patient declined  Transportation Needs: Patient Declined (11/30/2023)   Received from [facility] - Transportation    In the past 12 months, has lack of transportation kept you from medical appointments or from getting medications?: Patient declined    In the past 12 months, has lack of transportation kept you from meetings, work, or from getting things needed for daily living?: Patient declined  Physical Activity: Not on file  Stress: No Stress Concern Present (11/16/2023)   Received from [facility] - Occupational Stress Questionnaire    Feeling of Stress : Not at all  Social Connections: Not on file  Intimate Partner Violence: Not At Risk (11/16/2023)   Received from [facility]   HITS    Over the last 12 months how often did your partner physically hurt you?: Never    Over the last 12 months how often did your partner insult you or talk down to you?: Never    Over the last 12 months how often did your partner threaten you with physical harm?: Never    Over the last 12 months how often did your partner scream or curse at you?: Never    FAMILY HISTORY: Family History  Problem Relation Age of Onset   Cancer Mother    Stomach cancer Mother    Stroke Father    Bladder Cancer Neg Hx    Kidney cancer Neg Hx    Prostate cancer Neg Hx     ALLERGIES:  is allergic to sulfa antibiotics.  MEDICATIONS:  Current Outpatient Medications  Medication Sig Dispense Refill   ASPIRIN  81 PO Take by mouth.     calcitRIOL (ROCALTROL) 0.25 MCG capsule Take 0.25 mcg by mouth.     gabapentin  (NEURONTIN ) 600 MG tablet Take 600 mg by mouth  See admin instructions. Take 600 mg by mouth in the morning and afternoon and 900 mg at bedtime  latanoprost (XALATAN) 0.005 % ophthalmic solution Place 1 drop into the right eye at bedtime.     Multiple Vitamins-Minerals (CENTRUM SILVER PO) Take by mouth.     sodium bicarbonate 650 MG tablet Take 650 mg by mouth.     traMADol  (ULTRAM ) 50 MG tablet Take 50 mg by mouth 3 (three) times daily as needed for moderate pain.      No current facility-administered medications for this visit.    Review of Systems  Constitutional:  Positive for fatigue. Negative for chills and fever.  HENT:   Negative for hearing loss and voice change.   Eyes:  Negative for eye problems and icterus.  Respiratory:  Negative for chest tightness, cough and shortness of breath.   Cardiovascular:  Negative for chest pain and leg swelling.  Gastrointestinal:  Negative for abdominal distention and abdominal pain.  Endocrine: Negative for hot flashes.  Genitourinary:  Negative for difficulty urinating, dysuria and frequency.   Musculoskeletal:  Negative for arthralgias.  Skin:  Negative for itching and rash.  Neurological:  Negative for light-headedness and numbness.  Hematological:  Negative for adenopathy. Does not bruise/bleed easily.  Psychiatric/Behavioral:  Negative for confusion.    PHYSICAL EXAMINATION: ECOG PERFORMANCE STATUS: 2 - Symptomatic, <50% confined to bed Vitals:   12/02/23 1405  BP: (!) 107/54  Pulse: 65  Resp: 18  Temp: 97.6 F (36.4 C)  SpO2: 97%   Filed Weights   12/02/23 1405  Weight: 180 lb (81.6 kg)    Physical Exam Constitutional:      General: He is not in acute distress. HENT:     Head: Normocephalic and atraumatic.  Eyes:     General: No scleral icterus. Cardiovascular:     Rate and Rhythm: Normal rate and regular rhythm.     Heart sounds: Normal heart sounds.  Pulmonary:     Effort: Pulmonary effort is normal. No respiratory distress.     Breath sounds: No wheezing.   Abdominal:     General: Bowel sounds are normal. There is no distension.     Palpations: Abdomen is soft.  Musculoskeletal:        General: No deformity. Normal range of motion.     Cervical back: Normal range of motion and neck supple.  Skin:    General: Skin is warm and dry.     Findings: No erythema or rash.  Neurological:     Mental Status: He is alert and oriented to person, place, and time. Mental status is at baseline.     Cranial Nerves: No cranial nerve deficit.  Psychiatric:        Mood and Affect: Mood normal.     LABORATORY DATA:  I have reviewed the data as listed    Latest Ref Rng & Units 11/25/2023   11:03 AM 11/12/2023   10:57 AM 11/05/2023   10:52 AM  CBC  WBC 4.0 - 10.5 K/uL 6.9   6.8   Hemoglobin 13.0 - 17.0 g/dL 9.9  8.3  7.9   Hematocrit 39.0 - 52.0 % 32.8  26.2  24.3   Platelets 150 - 400 K/uL 222   245       Latest Ref Rng & Units 10/14/2023   12:07 PM 03/29/2023    6:18 AM 03/28/2023    4:03 AM  CMP  Creatinine 0.61 - 1.24 mg/dL  8.32  8.12   Total Protein 6.5 - 8.1 g/dL 6.3     Total Bilirubin 0.0 - 1.2 mg/dL 0.7  Alkaline Phos 38 - 126 U/L 71     AST 15 - 41 U/L 12     ALT 0 - 44 U/L 11         RADIOGRAPHIC STUDIES: I have personally reviewed the radiological images as listed and agreed with the findings in the report. DG Chest 2 View Result Date: 10/29/2023 CLINICAL DATA:  Positive TB test.  History of hypertension. EXAM: CHEST - 2 VIEW COMPARISON:  08/03/2018 and older studies. FINDINGS: Cardiac silhouette is normal in size and configuration. Stable left anterior chest wall loop recorder. Left anterior chest wall dual lead pacemaker, leads well positioned, also stable. No mediastinal or hilar masses. No evidence of adenopathy. Lungs are hyperexpanded. Mild prominence of the interstitial markings in lower lungs. Lungs are otherwise clear. No pleural effusion or pneumothorax. Skeletal structures are intact. IMPRESSION: 1. No acute  cardiopulmonary disease. Electronically Signed   By: Alm Parkins M.D.   On: 10/29/2023 10:30   VAS US  ABI WITH/WO TBI Result Date: 10/26/2023  LOWER EXTREMITY DOPPLER STUDY Patient Name:  Michael Holloway  Date of Exam:   10/23/2023 Medical Rec #: [id number]        Accession #:    [id number] Date of Birth: [birth year]        Patient Gender: M Patient Age:   46 years Exam Location:  [facility] Procedure:      VAS US  ABI WITH/WO TBI Referring Phys: SELINDA DEW --------------------------------------------------------------------------------  Indications: Ulceration, and peripheral artery disease. High Risk Factors: Hypertension, hyperlipidemia, past history of smoking.  Vascular Interventions: 03/27/2023 PTA left EIA, PTA, ATA and left EIA stent. Performing Technologist: Donnice Charnley RVT  Examination Guidelines: A complete evaluation includes at minimum, Doppler waveform signals and systolic blood pressure reading at the level of bilateral brachial, anterior tibial, and posterior tibial arteries, when vessel segments are accessible. Bilateral testing is considered an integral part of a complete examination. Photoelectric Plethysmograph (PPG) waveforms and toe systolic pressure readings are included as required and additional duplex testing as needed. Limited examinations for reoccurring indications may be performed as noted.  ABI Findings: +---------+------------------+-----+---------+--------+ Right    Rt Pressure (mmHg)IndexWaveform Comment  +---------+------------------+-----+---------+--------+ Brachial 135                                      +---------+------------------+-----+---------+--------+ PTA      152               1.12 triphasic         +---------+------------------+-----+---------+--------+ DP       143               1.05 triphasic         +---------+------------------+-----+---------+--------+ Great Toe114               0.84                    +---------+------------------+-----+---------+--------+ +---------+------------------+-----+---------+-------+ Left     Lt Pressure (mmHg)IndexWaveform Comment +---------+------------------+-----+---------+-------+ Brachial 136                                     +---------+------------------+-----+---------+-------+ PTA      139               1.02 triphasic        +---------+------------------+-----+---------+-------+ DP  144               1.06 hyperemic        +---------+------------------+-----+---------+-------+ Great Toe93                0.68                  +---------+------------------+-----+---------+-------+ +-------+-----------+-----------+------------+------------+ ABI/TBIToday's ABIToday's TBIPrevious ABIPrevious TBI +-------+-----------+-----------+------------+------------+ Right  1.12       0.84                                +-------+-----------+-----------+------------+------------+ Left   1.06       0.66                                +-------+-----------+-----------+------------+------------+   Summary: Right: Resting right ankle-brachial index is within normal range. The right toe-brachial index is normal. Left: Resting left ankle-brachial index is within normal range. The left toe-brachial index is abnormal. *See table(s) above for measurements and observations.  Electronically signed by Selinda Gu MD on 10/26/2023 at 8:37:37 AM.    Final    VAS US  LOWER EXTREMITY ARTERIAL DUPLEX Result Date: 10/26/2023 LOWER EXTREMITY ARTERIAL DUPLEX STUDY Patient Name:  Michael Holloway  Date of Exam:   10/23/2023 Medical Rec #: [id number]        Accession #:    [id number] Date of Birth: 07-28-1928        Patient Gender: M Patient Age:   49 years Exam Location:  [facility] Procedure:      VAS US  LOWER EXTREMITY ARTERIAL DUPLEX Referring Phys: SELINDA DEW --------------------------------------------------------------------------------  Indications:  Ulceration, and peripheral artery disease. High Risk Factors: Hypertension, past history of smoking.  Vascular Interventions: 03/27/2023 PTA left EIA, PTA, ATA and left EIA stent. Current ABI:            Right: 1.12, Left: 1.02 Performing Technologist: Donnice Charnley RVT  Examination Guidelines: A complete evaluation includes B-mode imaging, spectral Doppler, color Doppler, and power Doppler as needed of all accessible portions of each vessel. Bilateral testing is considered an integral part of a complete examination. Limited examinations for reoccurring indications may be performed as noted.   +----------+--------+-----+--------+---------+--------+ LEFT      PSV cm/sRatioStenosisWaveform Comments +----------+--------+-----+--------+---------+--------+ CFA Mid   106                                    +----------+--------+-----+--------+---------+--------+ CFA Distal96                   triphasic         +----------+--------+-----+--------+---------+--------+ DFA       63                   biphasic          +----------+--------+-----+--------+---------+--------+ SFA Prox  87                   triphasic         +----------+--------+-----+--------+---------+--------+ SFA Mid   124                  triphasic         +----------+--------+-----+--------+---------+--------+ SFA Distal86  biphasic          +----------+--------+-----+--------+---------+--------+ POP Prox  76                   triphasic         +----------+--------+-----+--------+---------+--------+ POP Distal75                   triphasic         +----------+--------+-----+--------+---------+--------+ ATA Distal60                   Hyperemic         +----------+--------+-----+--------+---------+--------+ PTA Distal48                   Hyperemic         +----------+--------+-----+--------+---------+--------+  Summary: Left: Patent stent with no evidence of stenosis  in the iliac artery artery. NO hemodynamically significant stenosis identified.  See table(s) above for measurements and observations. Electronically signed by Selinda Gu MD on 10/26/2023 at 8:36:55 AM.    Final

## 2023-12-02 NOTE — Assessment & Plan Note (Signed)
 Recommend patient to take Colace 100 mg 1-2 times daily.  MiraLAX 17 g daily as needed

## 2023-12-02 NOTE — Assessment & Plan Note (Addendum)
 Anemia is likely due to advanced CKD.   Lab Results  Component Value Date   HGB 9.9 (L) 11/25/2023   TIBC 167 (L) 11/25/2023   IRONPCTSAT 19 11/25/2023   FERRITIN 119 11/25/2023    I recommend IV venofer  weekly x 3 to further increase iron  store. Goal of Ferritin is 200.  Hemoglobin has improved after erythropoietin replacement therapy.  Hemoglobin 9.9 today very close to goal of 10.  I will hold off EPO.  Continue H&H every 4 weeks and erythropoietin replacement if hemoglobin less than 10.

## 2023-12-08 DIAGNOSIS — G9009 Other idiopathic peripheral autonomic neuropathy: Secondary | ICD-10-CM | POA: Insufficient documentation

## 2023-12-09 ENCOUNTER — Inpatient Hospital Stay
Admission: EM | Admit: 2023-12-09 | Discharge: 2023-12-12 | DRG: 474 | Disposition: A | Discharge status: Home Health | Source: Ambulatory Visit | Attending: Internal Medicine | Admitting: Internal Medicine

## 2023-12-09 ENCOUNTER — Other Ambulatory Visit: Payer: Self-pay

## 2023-12-09 ENCOUNTER — Emergency Department

## 2023-12-09 ENCOUNTER — Encounter: Payer: Self-pay | Admitting: Emergency Medicine

## 2023-12-09 DIAGNOSIS — M6702 Short Achilles tendon (acquired), left ankle: Secondary | ICD-10-CM | POA: Diagnosis present

## 2023-12-09 DIAGNOSIS — Z96653 Presence of artificial knee joint, bilateral: Secondary | ICD-10-CM | POA: Diagnosis present

## 2023-12-09 DIAGNOSIS — G629 Polyneuropathy, unspecified: Secondary | ICD-10-CM | POA: Diagnosis present

## 2023-12-09 DIAGNOSIS — Z85828 Personal history of other malignant neoplasm of skin: Secondary | ICD-10-CM | POA: Diagnosis not present

## 2023-12-09 DIAGNOSIS — Z95818 Presence of other cardiac implants and grafts: Secondary | ICD-10-CM | POA: Diagnosis not present

## 2023-12-09 DIAGNOSIS — L97529 Non-pressure chronic ulcer of other part of left foot with unspecified severity: Secondary | ICD-10-CM | POA: Diagnosis present

## 2023-12-09 DIAGNOSIS — N186 End stage renal disease: Secondary | ICD-10-CM | POA: Diagnosis present

## 2023-12-09 DIAGNOSIS — Z87891 Personal history of nicotine dependence: Secondary | ICD-10-CM

## 2023-12-09 DIAGNOSIS — I120 Hypertensive chronic kidney disease with stage 5 chronic kidney disease or end stage renal disease: Secondary | ICD-10-CM | POA: Diagnosis present

## 2023-12-09 DIAGNOSIS — D649 Anemia, unspecified: Secondary | ICD-10-CM | POA: Diagnosis not present

## 2023-12-09 DIAGNOSIS — Z96651 Presence of right artificial knee joint: Secondary | ICD-10-CM | POA: Diagnosis present

## 2023-12-09 DIAGNOSIS — Z992 Dependence on renal dialysis: Secondary | ICD-10-CM

## 2023-12-09 DIAGNOSIS — Z8551 Personal history of malignant neoplasm of bladder: Secondary | ICD-10-CM

## 2023-12-09 DIAGNOSIS — M868X7 Other osteomyelitis, ankle and foot: Principal | ICD-10-CM | POA: Diagnosis present

## 2023-12-09 DIAGNOSIS — I495 Sick sinus syndrome: Secondary | ICD-10-CM | POA: Diagnosis present

## 2023-12-09 DIAGNOSIS — N2581 Secondary hyperparathyroidism of renal origin: Secondary | ICD-10-CM | POA: Diagnosis present

## 2023-12-09 DIAGNOSIS — Z7982 Long term (current) use of aspirin: Secondary | ICD-10-CM | POA: Diagnosis not present

## 2023-12-09 DIAGNOSIS — Z950 Presence of cardiac pacemaker: Secondary | ICD-10-CM

## 2023-12-09 DIAGNOSIS — I10 Essential (primary) hypertension: Secondary | ICD-10-CM | POA: Diagnosis present

## 2023-12-09 DIAGNOSIS — D631 Anemia in chronic kidney disease: Secondary | ICD-10-CM | POA: Diagnosis present

## 2023-12-09 DIAGNOSIS — Z823 Family history of stroke: Secondary | ICD-10-CM

## 2023-12-09 DIAGNOSIS — I739 Peripheral vascular disease, unspecified: Secondary | ICD-10-CM | POA: Diagnosis present

## 2023-12-09 DIAGNOSIS — Z882 Allergy status to sulfonamides status: Secondary | ICD-10-CM | POA: Diagnosis not present

## 2023-12-09 DIAGNOSIS — Z95820 Peripheral vascular angioplasty status with implants and grafts: Secondary | ICD-10-CM

## 2023-12-09 DIAGNOSIS — G6289 Other specified polyneuropathies: Secondary | ICD-10-CM | POA: Diagnosis not present

## 2023-12-09 DIAGNOSIS — Z800 Family history of malignant neoplasm of digestive organs: Secondary | ICD-10-CM

## 2023-12-09 DIAGNOSIS — L97525 Non-pressure chronic ulcer of other part of left foot with muscle involvement without evidence of necrosis: Principal | ICD-10-CM

## 2023-12-09 DIAGNOSIS — M009 Pyogenic arthritis, unspecified: Secondary | ICD-10-CM | POA: Diagnosis present

## 2023-12-09 DIAGNOSIS — Z79899 Other long term (current) drug therapy: Secondary | ICD-10-CM | POA: Diagnosis not present

## 2023-12-09 DIAGNOSIS — N185 Chronic kidney disease, stage 5: Secondary | ICD-10-CM | POA: Diagnosis not present

## 2023-12-09 LAB — CBC WITH DIFFERENTIAL/PLATELET
Abs Immature Granulocytes: 0.01 K/uL (ref 0.00–0.07)
Basophils Absolute: 0 K/uL (ref 0.0–0.1)
Basophils Relative: 1 %
Eosinophils Absolute: 0.2 K/uL (ref 0.0–0.5)
Eosinophils Relative: 3 %
HCT: 32.7 % — ABNORMAL LOW (ref 39.0–52.0)
Hemoglobin: 10 g/dL — ABNORMAL LOW (ref 13.0–17.0)
Immature Granulocytes: 0 %
Lymphocytes Relative: 14 %
Lymphs Abs: 0.8 K/uL (ref 0.7–4.0)
MCH: 28.7 pg (ref 26.0–34.0)
MCHC: 30.6 g/dL (ref 30.0–36.0)
MCV: 93.7 fL (ref 80.0–100.0)
Monocytes Absolute: 0.5 K/uL (ref 0.1–1.0)
Monocytes Relative: 9 %
Neutro Abs: 4.3 K/uL (ref 1.7–7.7)
Neutrophils Relative %: 73 %
Platelets: 217 K/uL (ref 150–400)
RBC: 3.49 MIL/uL — ABNORMAL LOW (ref 4.22–5.81)
RDW: 13.8 % (ref 11.5–15.5)
WBC: 5.8 K/uL (ref 4.0–10.5)
nRBC: 0 % (ref 0.0–0.2)

## 2023-12-09 LAB — COMPREHENSIVE METABOLIC PANEL WITH GFR
ALT: 7 U/L (ref 0–44)
AST: 16 U/L (ref 15–41)
Albumin: 3.1 g/dL — ABNORMAL LOW (ref 3.5–5.0)
Alkaline Phosphatase: 70 U/L (ref 38–126)
Anion gap: 12 (ref 5–15)
BUN: 57 mg/dL — ABNORMAL HIGH (ref 8–23)
CO2: 22 mmol/L (ref 22–32)
Calcium: 8.3 mg/dL — ABNORMAL LOW (ref 8.9–10.3)
Chloride: 105 mmol/L (ref 98–111)
Creatinine, Ser: 5.91 mg/dL — ABNORMAL HIGH (ref 0.61–1.24)
GFR, Estimated: 8 mL/min — ABNORMAL LOW (ref >60)
Glucose, Bld: 93 mg/dL (ref 70–99)
Potassium: 4.3 mmol/L (ref 3.5–5.1)
Sodium: 139 mmol/L (ref 135–145)
Total Bilirubin: 0.6 mg/dL (ref 0.0–1.2)
Total Protein: 6.2 g/dL — ABNORMAL LOW (ref 6.5–8.1)

## 2023-12-09 LAB — LACTIC ACID, PLASMA: Lactic Acid, Venous: 1.2 mmol/L (ref 0.5–1.9)

## 2023-12-09 MED ORDER — GABAPENTIN 300 MG PO CAPS
600.0000 mg | ORAL_CAPSULE | Freq: Every day | ORAL | Status: DC
  Filled 2023-12-09: qty 2

## 2023-12-09 MED ORDER — ASPIRIN 81 MG PO CHEW
81.0000 mg | CHEWABLE_TABLET | Freq: Every day | ORAL | Status: DC
  Administered 2023-12-10 – 2023-12-12 (×2): 81 mg via ORAL
  Filled 2023-12-09 (×2): qty 1

## 2023-12-09 MED ORDER — CALCITRIOL 0.25 MCG PO CAPS
0.2500 ug | ORAL_CAPSULE | Freq: Every day | ORAL | Status: DC
  Administered 2023-12-10 – 2023-12-12 (×2): 0.25 ug via ORAL
  Filled 2023-12-09 (×3): qty 1

## 2023-12-09 MED ORDER — ONDANSETRON HCL 4 MG PO TABS: 4.0000 mg | ORAL_TABLET | Freq: Four times a day (QID) | ORAL | Status: DC | PRN

## 2023-12-09 MED ORDER — SODIUM CHLORIDE 0.9 % IV SOLN
1.0000 g | INTRAVENOUS | Status: DC
  Administered 2023-12-10 – 2023-12-11 (×2): 1 g via INTRAVENOUS
  Filled 2023-12-09 (×3): qty 10

## 2023-12-09 MED ORDER — VANCOMYCIN HCL 750 MG/150ML IV SOLN
750.0000 mg | Freq: Once | INTRAVENOUS | Status: AC
  Administered 2023-12-09: 750 mg via INTRAVENOUS
  Filled 2023-12-09: qty 150

## 2023-12-09 MED ORDER — TRAMADOL HCL 50 MG PO TABS: 50.0000 mg | ORAL_TABLET | Freq: Three times a day (TID) | ORAL | Status: DC | PRN

## 2023-12-09 MED ORDER — SODIUM BICARBONATE 650 MG PO TABS
650.0000 mg | ORAL_TABLET | Freq: Every day | ORAL | Status: DC
  Administered 2023-12-10: 650 mg via ORAL
  Filled 2023-12-09: qty 1

## 2023-12-09 MED ORDER — ONDANSETRON HCL 4 MG/2ML IJ SOLN: 4.0000 mg | Freq: Four times a day (QID) | INTRAMUSCULAR | Status: DC | PRN

## 2023-12-09 MED ORDER — VANCOMYCIN VARIABLE DOSE PER UNSTABLE RENAL FUNCTION (PHARMACIST DOSING)
Status: DC
  Filled 2023-12-09: qty 1

## 2023-12-09 MED ORDER — GABAPENTIN 300 MG PO CAPS
900.0000 mg | ORAL_CAPSULE | Freq: Every day | ORAL | Status: DC
  Administered 2023-12-09: 900 mg via ORAL
  Filled 2023-12-09: qty 3

## 2023-12-09 MED ORDER — METRONIDAZOLE 500 MG/100ML IV SOLN
500.0000 mg | Freq: Once | INTRAVENOUS | Status: AC
  Administered 2023-12-09: 500 mg via INTRAVENOUS
  Filled 2023-12-09: qty 100

## 2023-12-09 MED ORDER — HEPARIN SODIUM (PORCINE) 5000 UNIT/ML IJ SOLN
5000.0000 [IU] | Freq: Three times a day (TID) | INTRAMUSCULAR | Status: DC
  Administered 2023-12-09 – 2023-12-12 (×8): 5000 [IU] via SUBCUTANEOUS
  Filled 2023-12-09 (×8): qty 1

## 2023-12-09 MED ORDER — SENNOSIDES-DOCUSATE SODIUM 8.6-50 MG PO TABS: 1.0000 | ORAL_TABLET | Freq: Every evening | ORAL | Status: DC | PRN

## 2023-12-09 MED ORDER — ACETAMINOPHEN 650 MG RE SUPP: 650.0000 mg | Freq: Four times a day (QID) | RECTAL | Status: DC | PRN

## 2023-12-09 MED ORDER — SODIUM CHLORIDE 0.9 % IV SOLN
2.0000 g | Freq: Once | INTRAVENOUS | Status: AC
  Administered 2023-12-09: 2 g via INTRAVENOUS
  Filled 2023-12-09: qty 12.5

## 2023-12-09 MED ORDER — ACETAMINOPHEN 325 MG PO TABS
650.0000 mg | ORAL_TABLET | Freq: Four times a day (QID) | ORAL | Status: AC | PRN
Start: 2023-12-09 — End: ?

## 2023-12-09 MED ORDER — VANCOMYCIN HCL IN DEXTROSE 1-5 GM/200ML-% IV SOLN
1000.0000 mg | Freq: Once | INTRAVENOUS | Status: AC
  Administered 2023-12-09: 1000 mg via INTRAVENOUS
  Filled 2023-12-09: qty 200

## 2023-12-09 NOTE — Consult Note (Signed)
 Pharmacy Antibiotic Note  Michael Holloway is a 89 y.o. male admitted on 12/09/2023 with chronic left foot ulcer.  Pharmacy has been consulted for vancomycin  and cefepime  dosing.  Per H&P patient is ESRD but has not started PD yet  Vancomycin  1000 mg IV x 1 given 7/30 @ 1551  Plan: Give vancomycin  750 mg IV x 1 to complete 1750 mg loading dose Dose vancomycin  on levels due to unstable renal function Will order vancomycin  random level for 8/1 with AM labs Start cefepime  1 gram IV every 24 hours Adjust based on renal function and levels  Height: 6' 4 (193 cm) Weight: 81.6 kg (180 lb) IBW/kg (Calculated) : 86.8  Temp (24hrs), Avg:98.3 F (36.8 C), Min:98.3 F (36.8 C), Max:98.3 F (36.8 C)  Recent Labs  Lab 12/09/23 1136  WBC 5.8  CREATININE 5.91*  LATICACIDVEN 1.2    Estimated Creatinine Clearance: 9 mL/min (A) (by C-G formula based on SCr of 5.91 mg/dL (H)).    Allergies  Allergen Reactions   Sulfa Antibiotics Rash    Antimicrobials this admission: Cefepime  7/30 >>  vancomycin  7/30 >>   Microbiology results: 7/30 BCx: pending   Thank you for allowing pharmacy to be a part of this patient's care.  Michael Holloway, PharmD 12/09/2023 4:17 PM

## 2023-12-09 NOTE — ED Notes (Signed)
 XR at bedside at this time.

## 2023-12-09 NOTE — ED Notes (Signed)
 Pt given peanut butter, graham crackers, and water at this time.

## 2023-12-09 NOTE — ED Notes (Signed)
 Dr. Levander at bedside for evaluation at this time.

## 2023-12-09 NOTE — H&P (Signed)
 History and Physical    Patient: Michael Holloway DOB: 10-21-1928 DOA: 12/09/2023 DOS: the patient was seen and examined on 12/09/2023 PCP: Epifanio Alm SQUIBB, MD  Patient coming from: Home  Chief Complaint:  Chief Complaint  Patient presents with   Wound Infection   HPI: Michael Holloway is a 89 y.o. male with medical history significant of   peripheral vascular disease status post stent placement, peripheral neuropathy, bladder cancer,  ESRD s/p peritoneal dialysis catheter insertion but not yet initiated PD, anemia due to chronic kidney disease,  sick sinus syndrome s/p PPM, sent from podiatry office for admission for operative intervention of chronic left foot ulcer.   Patient reports that he has been otherwise well, denies fevers, chills, nausea, increased drainage or pain from the wound.  He has been compliant with his wound care instructions from podiatry.   He is also seeing hematology for iron  infusions prescribed anemia  Emergency department, he is hemodynamically stable with unremarkable workup thus far.  X-ray of left foot without evidence of osteomyelitis, MRI is pending.  Admit to hospitalist service pending OR Friday.      Review of Systems:  Review of Systems  Constitutional:  Negative for chills, fever, malaise/fatigue and weight loss.  Eyes:  Negative for double vision, pain and discharge.  Respiratory:  Negative for cough, sputum production and shortness of breath.   Cardiovascular:  Negative for chest pain, leg swelling and PND.  Gastrointestinal:  Negative for abdominal pain, blood in stool, constipation, diarrhea, nausea and vomiting.  Genitourinary:  Negative for dysuria, frequency and hematuria.  Musculoskeletal:  Negative for back pain and neck pain.  Skin:  Negative for itching and rash.  Neurological:  Positive for sensory change (neuropathy). Negative for dizziness, tingling, tremors, speech change, focal weakness and headaches.    Past  Medical History:  Diagnosis Date   Allergy    Arthritis    Bladder cancer (HCC)    Cancer (HCC)    Basal Cell Skin Cancer   Cataract    Chronic kidney disease    Stage 3 CKD   Dysrhythmia    GERD (gastroesophageal reflux disease)    Gout    History of kidney stones    Hypertension    Hypotension 05/24/2020   Neuropathy    Presence of permanent cardiac pacemaker 2020   Spinal stenosis    Past Surgical History:  Procedure Laterality Date   AMPUTATION Left 03/28/2023   Procedure: PARTIAL 5TH RAY AMPUTATION;  Surgeon: Lennie Barter, DPM;  Location: [facility];  Service: Orthopedics/Podiatry;  Laterality: Left;   BACK SURGERY     CERVICAL SPINE SURGERY      X 2   CHOLECYSTECTOMY     CYSTOSCOPY W/ RETROGRADES Bilateral 08/25/2016   Procedure: CYSTOSCOPY WITH RETROGRADE PYELOGRAM;  Surgeon: Rosina Riis, MD;  Location: [facility];  Service: Urology;  Laterality: Bilateral;   CYSTOSCOPY W/ RETROGRADES Bilateral 07/18/2019   Procedure: CYSTOSCOPY WITH RETROGRADE PYELOGRAM;  Surgeon: Riis Rosina, MD;  Location: [facility];  Service: Urology;  Laterality: Bilateral;   CYSTOSCOPY W/ RETROGRADES Bilateral 03/26/2020   Procedure: CYSTOSCOPY WITH RETROGRADE PYELOGRAM;  Surgeon: Riis Rosina, MD;  Location: [facility];  Service: Urology;  Laterality: Bilateral;   EYE SURGERY Bilateral    Cataract Extraction with IOL   JOINT REPLACEMENT Bilateral    total knee replacements   LOOP RECORDER INSERTION N/A 06/03/2017   Procedure: LOOP RECORDER INSERTION;  Surgeon: Ammon Blunt, MD;  Location: [facility];  Service: Cardiovascular;  Laterality: N/A;   LOWER EXTREMITY ANGIOGRAPHY Left 03/27/2023   Procedure: Lower Extremity Angiography;  Surgeon: Marea Selinda RAMAN, MD;  Location: [facility];  Service: Cardiovascular;  Laterality: Left;   LUMBAR LAMINECTOMY      X 4   PACEMAKER INSERTION Left 08/03/2018   Procedure: INSERTION PACEMAKER DUALCHAMBER;  Surgeon: Ammon Blunt, MD;  Location: [facility];  Service: Cardiovascular;  Laterality: Left;   PROSTATE SURGERY  1990s   Dr. Gala ,in office procedure   TONSILLECTOMY     TOTAL KNEE ARTHROPLASTY Right 09/29/2019   Procedure: TOTAL KNEE ARTHROPLASTY;  Surgeon: Edie Norleen PARAS, MD;  Location: [facility];  Service: Orthopedics;  Laterality: Right;   TRANSURETHRAL RESECTION OF BLADDER TUMOR WITH MITOMYCIN -C N/A 08/25/2016   Procedure: TRANSURETHRAL RESECTION OF BLADDER TUMOR WITH MITOMYCIN -C;  Surgeon: Rosina Riis, MD;  Location: [facility];  Service: Urology;  Laterality: N/A;   TRANSURETHRAL RESECTION OF BLADDER TUMOR WITH MITOMYCIN -C N/A 07/18/2019   Procedure: TRANSURETHRAL RESECTION OF BLADDER TUMOR WITH gemcitabine ;  Surgeon: Riis Rosina, MD;  Location: [facility];  Service: Urology;  Laterality: N/A;   TRANSURETHRAL RESECTION OF BLADDER TUMOR WITH MITOMYCIN -C N/A 03/26/2020   Procedure: TRANSURETHRAL RESECTION OF BLADDER TUMOR WITH gemcitabine ;  Surgeon: Riis Rosina, MD;  Location: [facility];  Service: Urology;  Laterality: N/A;   TRANSURETHRAL RESECTION OF PROSTATE N/A 06/14/2018   Procedure: TRANSURETHRAL RESECTION OF THE PROSTATE (TURP) with Gemcitabine ;  Surgeon: Riis Rosina, MD;  Location: [facility];  Service: Urology;  Laterality: N/A;   Social History:  reports that he quit smoking about 45 years ago. His smoking use included cigarettes. He started smoking about 75 years ago. He has a 15 pack-year smoking history. He has been exposed to tobacco smoke. He has never used smokeless tobacco. He reports that he does not currently use alcohol. He reports that he does not use drugs.  Allergies  Allergen Reactions   Sulfa Antibiotics Rash    Family History  Problem Relation Age of Onset   Cancer Mother    Stomach cancer Mother    Stroke Father    Bladder Cancer Neg Hx    Kidney cancer Neg Hx    Prostate cancer Neg Hx     Prior to Admission medications   Medication Sig Start Date End Date  Taking? Authorizing Provider  ASPIRIN  81 PO Take by mouth.    [provider]  calcitRIOL  (ROCALTROL ) 0.25 MCG capsule Take 0.25 mcg by mouth. 08/03/23 08/02/24  [provider]  gabapentin  (NEURONTIN ) 600 MG tablet Take 600 mg by mouth See admin instructions. Take 600 mg by mouth in the morning and afternoon and 900 mg at bedtime 07/22/16   [provider]  latanoprost  (XALATAN ) 0.005 % ophthalmic solution Place 1 drop into the right eye at bedtime. 02/14/21   [provider]  Multiple Vitamins-Minerals (CENTRUM SILVER PO) Take by mouth.    [provider]  sodium bicarbonate  650 MG tablet Take 650 mg by mouth. 11/04/23 11/03/24  [provider]  traMADol  (ULTRAM ) 50 MG tablet Take 50 mg by mouth 3 (three) times daily as needed for moderate pain.  06/16/16   [provider]    Physical Exam: Vitals:   12/09/23 1134 12/09/23 1230  BP: (!) 112/55 114/67  Pulse: 64 66  Resp: 18 18  Temp: 98.3 F (36.8 C)   TempSrc: Oral   SpO2: 100% 100%  Weight: 81.6 kg   Height: 6' 4 (1.93  m)    Physical Activity: Not on file   Physical Exam Vitals and nursing note reviewed.  Constitutional:      General: He is not in acute distress.    Appearance: He is not ill-appearing or toxic-appearing.  HENT:     Head: Normocephalic and atraumatic.  Eyes:     General: No scleral icterus.    Extraocular Movements: Extraocular movements intact.     Pupils: Pupils are equal, round, and reactive to light.  Cardiovascular:     Rate and Rhythm: Regular rhythm. Bradycardia present.  Pulmonary:     Effort: Pulmonary effort is normal. No respiratory distress.     Breath sounds: Normal breath sounds. No wheezing.  Abdominal:     Palpations: Abdomen is soft.     Comments: PD dialysis catheter in place, dressing CDI, no evidence of site infection    Musculoskeletal:     Cervical back: Normal range of motion and neck supple.     Comments: Left foot  surgically dressed, dressing CDI deferred direct visualization. L foot with generalized edema  L foot warm, DP pulses present b/l   Skin:    General: Skin is warm and dry.     Capillary Refill: Capillary refill takes less than 2 seconds.  Neurological:     General: No focal deficit present.     Mental Status: He is alert and oriented to person, place, and time. Mental status is at baseline.  Psychiatric:        Mood and Affect: Mood normal.        Behavior: Behavior normal.     Data Reviewed:    Labs on Admission: I have personally reviewed following labs and imaging studies  CBC: Recent Labs  Lab 12/09/23 1136  WBC 5.8  NEUTROABS 4.3  HGB 10.0*  HCT 32.7*  MCV 93.7  PLT 217   Basic Metabolic Panel: Recent Labs  Lab 12/09/23 1136  NA 139  K 4.3  CL 105  CO2 22  GLUCOSE 93  BUN 57*  CREATININE 5.91*  CALCIUM  8.3*   GFR: Estimated Creatinine Clearance: 9 mL/min (A) (by C-G formula based on SCr of 5.91 mg/dL (H)). Liver Function Tests: Recent Labs  Lab 12/09/23 1136  AST 16  ALT 7  ALKPHOS 70  BILITOT 0.6  PROT 6.2*  ALBUMIN 3.1*   No results for input(s): LIPASE, AMYLASE in the last 168 hours. No results for input(s): AMMONIA in the last 168 hours. Coagulation Profile: No results for input(s): INR, PROTIME in the last 168 hours. Cardiac Enzymes: No results for input(s): CKTOTAL, CKMB, CKMBINDEX, TROPONINI in the last 168 hours. BNP (last 3 results) No results for input(s): PROBNP in the last 8760 hours. HbA1C: No results for input(s): HGBA1C in the last 72 hours. CBG: No results for input(s): GLUCAP in the last 168 hours. Lipid Profile: No results for input(s): CHOL, HDL, LDLCALC, TRIG, CHOLHDL, LDLDIRECT in the last 72 hours. Thyroid Function Tests: No results for input(s): TSH, T4TOTAL, FREET4, T3FREE, THYROIDAB in the last 72 hours. Anemia Panel: No results for input(s): VITAMINB12, FOLATE,  FERRITIN, TIBC, IRON , RETICCTPCT in the last 72 hours. Urine analysis:    Component Value Date/Time   COLORURINE YELLOW (A) 06/14/2020 0843   APPEARANCEUR Clear 04/29/2023 1055   LABSPEC 1.010 06/14/2020 0843   PHURINE 6.0 06/14/2020 0843   GLUCOSEU Negative 04/29/2023 1055   HGBUR NEGATIVE 06/14/2020 0843   BILIRUBINUR Negative 04/29/2023 1055   KETONESUR NEGATIVE 06/14/2020 0843  PROTEINUR 1+ (A) 04/29/2023 1055   PROTEINUR 30 (A) 06/14/2020 0843   NITRITE Negative 04/29/2023 1055   NITRITE NEGATIVE 06/14/2020 0843   LEUKOCYTESUR 1+ (A) 04/29/2023 1055   LEUKOCYTESUR MODERATE (A) 06/14/2020 0843    Radiological Exams on Admission: DG Foot Complete Left Result Date: 12/09/2023 CLINICAL DATA:  foot wound. EXAM: LEFT FOOT - COMPLETE 3+ VIEW COMPARISON:  03/28/2023. FINDINGS: No acute fracture or dislocation. No aggressive osseous lesion. Redemonstration of transmetatarsal amputation of fifth toe. The resection margin is sharp. No focal bone erosions. No overlying soft tissue defect. Diffuse mild-to-moderate degenerative changes of imaged joints. Calcaneal spur noted along the Achilles tendon and Plantar aponeurosis attachment sites. No focal soft tissue swelling. No soft tissue defect or air within the soft tissue. No radiopaque foreign bodies. IMPRESSION: No acute osseous abnormality of the left foot. No radiographic evidence of osteomyelitis. Electronically Signed   By: Ree Molt M.D.   On: 12/09/2023 13:31       Assessment and Plan: No notes have been filed under this hospital service. Service: Hospitalist  89 y.o. male with medical history significant of   peripheral vascular disease status post stent placement, peripheral neuropathy, bladder cancer,  ESRD s/p peritoneal dialysis catheter insertion but not yet initiated PD, anemia due to chronic kidney disease,  sick sinus syndrome s/p PPM, sent from podiatry office for admission for operative intervention of chronic  left foot ulcer.   Chronic Left foot ulcer  - MRI pending  - OR with Dr. Friday per Dr. Ashley  -Continue cefepime  and vanco per Pharmacy - wound care   Peripheral vascular disease s/p stenting  - cont asa   ESRD status post peritoneal dialysis catheter insertion - Has not yet initiated dialysis.  Currently euvolemic without uremic symptoms - Baseline Cr Seems to fluctuate,  7.66 last month per care everywhere.  Currently 5.91 - Avoid nephrotoxic agents, renally dose medications, monitor renal indices  - cont calcitriol , sodium bicarb   Peripheral neuropathy  - cont gabapentin    Anemia of CKD - receiving venofer  infusions outpatient.  - Hemoglobin stable.  Continue to monitor  SSS s/p PPM  - cont baby asa   Heparin  dvt ppx  Regular diet  No ivf  Monitor replace electrolytes    Advance Care Planning:   Code Status: Prior  discussed with patient at time of admission   Consults: Podiatry     Severity of Illness: The appropriate patient status for this patient is INPATIENT. Inpatient status is judged to be reasonable and necessary in order to provide the required intensity of service to ensure the patient's safety. The patient's presenting symptoms, physical exam findings, and initial radiographic and laboratory data in the context of their chronic comorbidities is felt to place them at high risk for further clinical deterioration. Furthermore, it is not anticipated that the patient will be medically stable for discharge from the hospital within 2 midnights of admission.   * I certify that at the point of admission it is my clinical judgment that the patient will require inpatient hospital care spanning beyond 2 midnights from the point of admission due to high intensity of service, high risk for further deterioration and high frequency of surveillance required.*  Author: Daved JAYSON Pump, DO 12/09/2023 3:02 PM  For on call review [email or web address].

## 2023-12-09 NOTE — ED Triage Notes (Signed)
 Patient to ED via POV for wound infection to left foot. Ongoing for a few months- sent for surgery by Dr Neill. Pt reports he will be starting peritoneal dialysis soon.

## 2023-12-09 NOTE — ED Provider Notes (Signed)
 [facility] Provider Note    Event Date/Time   First MD Initiated Contact with Patient 12/09/23 1215     (approximate)   History   Wound Infection   HPI  Michael Holloway is a 89 year old male with history of CKD/ESRD awaiting initiation of PD, anemia, sick sinus syndrome s/p pacemaker presenting to the emergency department for evaluation of foot wound.  Patient was seen in the office by Dr. Fernande with podiatry.  He was noted to have a concerning ulcer over his left foot for which it was recommended that he present to the ER with anticipated admission for operative intervention with Dr. Ashley.     Physical Exam   Triage Vital Signs: ED Triage Vitals  Encounter Vitals Group     BP 12/09/23 1134 (!) 112/55     Girls Systolic BP Percentile --      Girls Diastolic BP Percentile --      Boys Systolic BP Percentile --      Boys Diastolic BP Percentile --      Pulse Rate 12/09/23 1134 64     Resp 12/09/23 1134 18     Temp 12/09/23 1134 98.3 F (36.8 C)     Temp Source 12/09/23 1134 Oral     SpO2 12/09/23 1134 100 %     Weight 12/09/23 1134 180 lb (81.6 kg)     Height 12/09/23 1134 6' 4 (1.93 m)     Head Circumference --      Peak Flow --      Pain Score 12/09/23 1133 0     Pain Loc --      Pain Education --      Exclude from Growth Chart --     Most recent vital signs: Vitals:   12/09/23 1134 12/09/23 1230  BP: (!) 112/55 114/67  Pulse: 64 66  Resp: 18 18  Temp: 98.3 F (36.8 C)   SpO2: 100% 100%     General: Awake, interactive  CV:  Regular rate, good peripheral perfusion.  Resp:  Unlabored respirations.  Abd:  Nondistended.  Neuro:  Symmetric facial movement, fluid speech MSK:  Intact DP pulses.  There is a ulcer along the medial aspect of the left great toe.  There is a more superficial abrasion along the right second toe.  See images below.         ED Results / Procedures / Treatments   Labs (all labs ordered are  listed, but only abnormal results are displayed) Labs Reviewed  COMPREHENSIVE METABOLIC PANEL WITH GFR - Abnormal; Notable for the following components:      Result Value   BUN 57 (*)    Creatinine, Ser 5.91 (*)    Calcium  8.3 (*)    Total Protein 6.2 (*)    Albumin 3.1 (*)    GFR, Estimated 8 (*)    All other components within normal limits  CBC WITH DIFFERENTIAL/PLATELET - Abnormal; Notable for the following components:   RBC 3.49 (*)    Hemoglobin 10.0 (*)    HCT 32.7 (*)    All other components within normal limits  CULTURE, BLOOD (ROUTINE X 2)  CULTURE, BLOOD (ROUTINE X 2)  LACTIC ACID, PLASMA     EKG EKG independently reviewed and interpreted by myself demonstrates:    RADIOLOGY Imaging independently reviewed and interpreted by myself demonstrates:  Foot x-Yussef Jorge without obvious osteomyelitis  Formal Radiology Read:  DG Foot Complete Left Result Date: 12/09/2023 CLINICAL  DATA:  foot wound. EXAM: LEFT FOOT - COMPLETE 3+ VIEW COMPARISON:  03/28/2023. FINDINGS: No acute fracture or dislocation. No aggressive osseous lesion. Redemonstration of transmetatarsal amputation of fifth toe. The resection margin is sharp. No focal bone erosions. No overlying soft tissue defect. Diffuse mild-to-moderate degenerative changes of imaged joints. Calcaneal spur noted along the Achilles tendon and Plantar aponeurosis attachment sites. No focal soft tissue swelling. No soft tissue defect or air within the soft tissue. No radiopaque foreign bodies. IMPRESSION: No acute osseous abnormality of the left foot. No radiographic evidence of osteomyelitis. Electronically Signed   By: Ree Molt M.D.   On: 12/09/2023 13:31    PROCEDURES:  Critical Care performed: No  Procedures   MEDICATIONS ORDERED IN ED: Medications  metroNIDAZOLE  (FLAGYL ) IVPB 500 mg (500 mg Intravenous New Bag/Given 12/09/23 1426)  vancomycin  (VANCOCIN ) IVPB 1000 mg/200 mL premix (has no administration in time range)   ceFEPIme  (MAXIPIME ) 2 g in sodium chloride  0.9 % 100 mL IVPB (2 g Intravenous New Bag/Given 12/09/23 1302)     IMPRESSION / MDM / ASSESSMENT AND PLAN / ED COURSE  I reviewed the triage vital signs and the nursing notes.  Differential diagnosis includes, but is not limited to, foot ulcer, cellulitis, osteomyelitis, no evidence of arterial occlusion.  Patient's presentation is most consistent with acute presentation with potential threat to life or bodily function.  89 year old male presenting to the emergency department for evaluation of foot wound.  Stable vitals on presentation.  Labs with stable anemia and renal dysfunction, reassuring potassium.  Normal lactate.  Case reviewed with Dr. Fernande and Dr. Ashley.  They do recommend admission with IV antibiotics to medicine service.  Recommend MRI if able to be obtained though may be limited due to patient's pacemaker in which case they do recommend x-Harden Bramer.  Reviewed with MRI tech, recommended they place the order for an MRI and they will review to see if patient's device is compatible for this.  Ordered for broad-spectrum antibiotics.  Tentative operative plan for Friday.  Will reach out to hospitalist team to discuss admission.  Case reviewed with hospitalist team.  They will evaluate for anticipated admission.      FINAL CLINICAL IMPRESSION(S) / ED DIAGNOSES   Final diagnoses:  Ulcer of left foot with muscle involvement without evidence of necrosis (HCC)     Rx / DC Orders   ED Discharge Orders     None        Note:  This document was prepared using Dragon voice recognition software and may include unintentional dictation errors.   Levander Slate, MD 12/09/23 [phone]

## 2023-12-09 NOTE — ED Notes (Signed)
Pt given phone for MRI screening.  

## 2023-12-09 NOTE — Progress Notes (Signed)
  Device system confirmed  , to be MRI conditional, with implant date > 6 weeks ago, and no evidence of abandoned or epicardial leads in review of most recent CXR  Device last cleared by EP Provider: Suzann Riddle 12/09/23  Clearance is good through for 1 year as long as parameters remain stable at time of check. If pt undergoes a cardiac device procedure during that time, they should be re-cleared.   Tachy-therapies to be programmed off if applicable with device back to pre-MRI settings after completion of exam.  [employer] - Programming recommendation received through [employer] App/Tablet  Michael Holloway, RT  12/09/2023 1:54 PM

## 2023-12-10 ENCOUNTER — Inpatient Hospital Stay

## 2023-12-10 DIAGNOSIS — I495 Sick sinus syndrome: Secondary | ICD-10-CM

## 2023-12-10 DIAGNOSIS — G6289 Other specified polyneuropathies: Secondary | ICD-10-CM

## 2023-12-10 DIAGNOSIS — I10 Essential (primary) hypertension: Secondary | ICD-10-CM

## 2023-12-10 DIAGNOSIS — N186 End stage renal disease: Secondary | ICD-10-CM | POA: Diagnosis not present

## 2023-12-10 DIAGNOSIS — L97529 Non-pressure chronic ulcer of other part of left foot with unspecified severity: Secondary | ICD-10-CM | POA: Diagnosis not present

## 2023-12-10 DIAGNOSIS — G629 Polyneuropathy, unspecified: Secondary | ICD-10-CM

## 2023-12-10 LAB — BASIC METABOLIC PANEL WITH GFR
Anion gap: 9 (ref 5–15)
BUN: 57 mg/dL — ABNORMAL HIGH (ref 8–23)
CO2: 22 mmol/L (ref 22–32)
Calcium: 7.9 mg/dL — ABNORMAL LOW (ref 8.9–10.3)
Chloride: 111 mmol/L (ref 98–111)
Creatinine, Ser: 5.84 mg/dL — ABNORMAL HIGH (ref 0.61–1.24)
GFR, Estimated: 8 mL/min — ABNORMAL LOW (ref >60)
Glucose, Bld: 73 mg/dL (ref 70–99)
Potassium: 3.9 mmol/L (ref 3.5–5.1)
Sodium: 142 mmol/L (ref 135–145)

## 2023-12-10 LAB — SURGICAL PCR SCREEN
MRSA, PCR: NEGATIVE
Staphylococcus aureus: POSITIVE — AB

## 2023-12-10 LAB — CBC
HCT: 28.3 % — ABNORMAL LOW (ref 39.0–52.0)
Hemoglobin: 8.7 g/dL — ABNORMAL LOW (ref 13.0–17.0)
MCH: 28.6 pg (ref 26.0–34.0)
MCHC: 30.7 g/dL (ref 30.0–36.0)
MCV: 93.1 fL (ref 80.0–100.0)
Platelets: 193 K/uL (ref 150–400)
RBC: 3.04 MIL/uL — ABNORMAL LOW (ref 4.22–5.81)
RDW: 13.7 % (ref 11.5–15.5)
WBC: 6.3 K/uL (ref 4.0–10.5)
nRBC: 0 % (ref 0.0–0.2)

## 2023-12-10 MED ORDER — EYE HEALTH AREDS 2 PO CAPS: ORAL_CAPSULE | Freq: Two times a day (BID) | ORAL | Status: DC

## 2023-12-10 MED ORDER — LATANOPROST 0.005 % OP SOLN
1.0000 [drp] | Freq: Every day | OPHTHALMIC | Status: DC
  Administered 2023-12-10 – 2023-12-11 (×2): 1 [drp] via OPHTHALMIC
  Filled 2023-12-10: qty 2.5

## 2023-12-10 MED ORDER — GABAPENTIN 300 MG PO CAPS
300.0000 mg | ORAL_CAPSULE | Freq: Three times a day (TID) | ORAL | Status: DC
  Administered 2023-12-10 – 2023-12-12 (×5): 300 mg via ORAL
  Filled 2023-12-10 (×5): qty 1

## 2023-12-10 MED ORDER — MUPIROCIN 2 % EX OINT
1.0000 | TOPICAL_OINTMENT | Freq: Two times a day (BID) | CUTANEOUS | Status: DC
  Administered 2023-12-10 – 2023-12-11 (×2): 1 via NASAL
  Filled 2023-12-10: qty 22

## 2023-12-10 MED ORDER — CHLORHEXIDINE GLUCONATE 4 % EX SOLN
60.0000 mL | Freq: Once | CUTANEOUS | Status: AC
  Administered 2023-12-11: 4 via TOPICAL

## 2023-12-10 MED ORDER — ADULT MULTIVITAMIN W/MINERALS CH
1.0000 | ORAL_TABLET | Freq: Every day | ORAL | Status: DC
  Administered 2023-12-10 – 2023-12-12 (×2): 1 via ORAL
  Filled 2023-12-10 (×2): qty 1

## 2023-12-10 MED ORDER — GABAPENTIN 300 MG PO CAPS: 300.0000 mg | ORAL_CAPSULE | Freq: Three times a day (TID) | ORAL | Status: DC

## 2023-12-10 NOTE — Progress Notes (Signed)
  Progress Note   Patient: Michael Holloway FMW:[id number] DOB: 01-09-1929 DOA: 12/09/2023     1 DOS: the patient was seen and examined on 12/10/2023   Brief hospital course: Taken from H&P.  Michael Holloway is a 89 y.o. male with medical history significant of   peripheral vascular disease status post stent placement, peripheral neuropathy, bladder cancer,  ESRD s/p peritoneal dialysis catheter insertion but not yet initiated PD, anemia due to chronic kidney disease,  sick sinus syndrome s/p PPM, sent from podiatry office for admission for operative intervention of chronic left foot ulcer.   On presentation hemodynamically stable, labs mostly , x-ray of the left foot without any evidence of osteomyelitis and MRI was ordered. Patient was started on cefepime  and vancomycin .  Planned OR on Friday.  7/31: Vital stable, labs with decrease of hemoglobin to 8.7-no obvious bleeding, creatinine of 5.84, preliminary blood cultures negative.  MRI of left foot with osteomyelitis/septic arthritis of first metatarsal head and neck along with the base/proximal half of the first proximal phalanx.  No abscess. TMA tomorrow.  Assessment and Plan: * Chronic ulcer of great toe of left foot, unspecified ulcer stage (HCC) Osteomyelitis of first phalanx and joint. -Continue with broad-spectrum antibiotics -TMA is planned for tomorrow -Supportive care  ESRD (end stage renal disease) (HCC) Patient has PD catheter in place but has not started the dialysis yet.  Renal function consistent with ESRD -Nephrology was consulted  Peripheral neuropathy - Continue gabapentin   Hypertension Blood pressure mildly elevated, not on any antihypertensives at home. - Continue to monitor  Sick sinus syndrome [facility]) S/p permanent pacemaker in place. - Continue to monitor   Subjective: Patient was seen and examined today.  Denies any significant pain.  Physical Exam: Vitals:   12/09/23 1949 12/10/23 0404 12/10/23 0905  12/10/23 1613  BP: (!) 157/80 (!) 94/49 (!) 139/58 (!) 145/64  Pulse: 82 64 79 80  Resp: 16 16 16 16   Temp: 97.6 F (36.4 C) 98.7 F (37.1 C) 98.4 F (36.9 C) 98.2 F (36.8 C)  TempSrc:    Oral  SpO2: 95% 98% 100% 100%  Weight:      Height:       General.  Frail elderly man, in no acute distress. Pulmonary.  Lungs clear bilaterally, normal respiratory effort. CV.  Regular rate and rhythm, no JVD, rub or murmur. Abdomen.  Soft, nontender, nondistended, BS positive. CNS.  Alert and oriented .  No focal neurologic deficit. Extremities.  No edema, no cyanosis, pulses intact and symmetrical. Psychiatry.  Judgment and insight appears normal.   Data Reviewed: Prior data reviewed  Family Communication:   Disposition: Status is: Inpatient Remains inpatient appropriate because: Severity of illness  Planned Discharge Destination: To be determined  DVT Prophylaxis. Lovenox  Time spent: 50 minutes  This record has been created using Conservation officer, historic buildings. Errors have been sought and corrected,but may not always be located. Such creation errors do not reflect on the standard of care.   Author: Amaryllis Dare, MD 12/10/2023 4:35 PM  For on call review [email or web address].

## 2023-12-10 NOTE — Assessment & Plan Note (Signed)
 Osteomyelitis of first phalanx and joint. -Continue with broad-spectrum antibiotics -TMA is planned for tomorrow -Supportive care

## 2023-12-10 NOTE — Assessment & Plan Note (Signed)
 Blood pressure mildly elevated, not on any antihypertensives at home. - Continue to monitor

## 2023-12-10 NOTE — Assessment & Plan Note (Signed)
 S/p permanent pacemaker in place. - Continue to monitor

## 2023-12-10 NOTE — Hospital Course (Addendum)
 Taken from H&P.  Michael Holloway is a 89 y.o. male with medical history significant of   peripheral vascular disease status post stent placement, peripheral neuropathy, bladder cancer,  ESRD s/p peritoneal dialysis catheter insertion but not yet initiated PD, anemia due to chronic kidney disease,  sick sinus syndrome s/p PPM, sent from podiatry office for admission for operative intervention of chronic left foot ulcer.   On presentation hemodynamically stable, labs mostly , x-ray of the left foot without any evidence of osteomyelitis and MRI was ordered. Patient was started on cefepime  and vancomycin .  Planned OR on Friday.  7/31: Vital stable, labs with decrease of hemoglobin to 8.7-no obvious bleeding, creatinine of 5.84, preliminary blood cultures negative.  MRI of left foot with osteomyelitis/septic arthritis of first metatarsal head and neck along with the base/proximal half of the first proximal phalanx.  No abscess.  8/1: Hemodynamically stable, s/p TMA today with podiatry. Nephrology is holding the start of PD for now and closely monitoring, likely be started from Monday as outpatient.  8/2: Vitals and labs stable, bicarb at 20s so home dose of bicarb was increased from twice daily to 3 times daily.  Patient will follow-up closely with his nephrologist to start on peritoneal dialysis.  Pain seems well-controlled.  Podiatrist changed the dressing and cleared him for discharge.  They will follow-up closely as outpatient for further assistance. Intraoperative cultures with no organism so far, he is being discharged on doxycycline  for few more days as recommended by podiatry.  They will follow-up cultures and pathology results.  Patient will remain nonweightbearing on left foot except heel which he can only use for transfers and for bathroom privileges.  He need to wear surgical boot all the time.  Our physical therapist evaluated him and recommended home health services which was ordered.   Patient also lives with son who is going to assist with his ADLs.  He will continue on current medications and need to have a close follow-up with his providers for further assistance.

## 2023-12-10 NOTE — Assessment & Plan Note (Signed)
 Continue gabapentin.

## 2023-12-10 NOTE — Plan of Care (Signed)

## 2023-12-10 NOTE — Progress Notes (Signed)
 [facility]  ROUNDING NOTE   Subjective:   Michael Holloway is a 89 year old male with past medical conditions including PVD, bladder cancer, anemia, sick sinus syndrome with PPM, and ESRD on peritoneal dialysis.  Patient presents to the emergency department for evaluation of left foot wound and has been admitted for Chronic ulcer of great toe of left foot, unspecified ulcer stage (HCC) [L97.529] Ulcer of left foot with muscle involvement without evidence of necrosis [facility]) [L97.525]  Patient is known our practice and is followed outpatient by Dr. Douglas at [facility] for peritoneal dialysis.  Patient is new to peritoneal dialysis and began training just this week.  Patient states he has had this wound on his foot and has been seen by podiatry outpatient for management.  During appointment yesterday, he was sent to emergency room for further evaluation with possible antibiotics.  States they are concerned for osteomyelitis versus septic arthritis.  Patient states he feels well, no known fever or chills.  Labs on ED arrival unremarkable for renal patient.  Hemoglobin 10.0.  Left foot x-ray shows no evidence of osteomyelitis.  Left foot MRI does show osteomyelitis/septic arthritis in the first metatarsal head and neck.  We have been consulted to manage dialysis needs during this admission.   Objective:  Vital signs in last 24 hours:  Temp:  [97.6 F (36.4 C)-98.7 F (37.1 C)] 98.2 F (36.8 C) (07/31 1613) Pulse Rate:  [62-82] 80 (07/31 1613) Resp:  [16] 16 (07/31 1613) BP: (94-165)/(49-80) 145/64 (07/31 1613) SpO2:  [95 %-100 %] 100 % (07/31 1613)  Weight change:  Filed Weights   12/09/23 1134  Weight: 81.6 kg    Intake/Output: I/O last 3 completed shifts: In: 642.7 [P.O.:240; IV Piggyback:402.7] Out: -    Intake/Output this shift:  Total I/O In: 480 [P.O.:480] Out: -   Physical Exam: General: NAD, resting in bed  Head: Normocephalic, atraumatic. Moist oral  mucosal membranes  Eyes: Anicteric  Neck: Supple  Lungs:  Clear to auscultation, normal effort  Heart: Regular rate and rhythm  Abdomen:  Soft, nontender, PDC  Extremities: No peripheral edema.  Neurologic: Awake, alert, conversant  Skin: Warm,dry, no rash  Access: PD catheter    Basic Metabolic Panel: Recent Labs  Lab 12/09/23 1136 12/10/23 0352  NA 139 142  K 4.3 3.9  CL 105 111  CO2 22 22  GLUCOSE 93 73  BUN 57* 57*  CREATININE 5.91* 5.84*  CALCIUM  8.3* 7.9*    Liver Function Tests: Recent Labs  Lab 12/09/23 1136  AST 16  ALT 7  ALKPHOS 70  BILITOT 0.6  PROT 6.2*  ALBUMIN 3.1*   No results for input(s): LIPASE, AMYLASE in the last 168 hours. No results for input(s): AMMONIA in the last 168 hours.  CBC: Recent Labs  Lab 12/09/23 1136 12/10/23 0352  WBC 5.8 6.3  NEUTROABS 4.3  --   HGB 10.0* 8.7*  HCT 32.7* 28.3*  MCV 93.7 93.1  PLT 217 193    Cardiac Enzymes: No results for input(s): CKTOTAL, CKMB, CKMBINDEX, TROPONINI in the last 168 hours.  BNP: Invalid input(s): POCBNP  CBG: No results for input(s): GLUCAP in the last 168 hours.  Microbiology: Results for orders placed or performed during the hospital encounter of 12/09/23  Blood Culture (routine x 2)     Status: None (Preliminary result)   Collection Time: 12/09/23 12:53 PM   Specimen: BLOOD  Result Value Ref Range Status   Specimen Description BLOOD RIGHT ANTECUBITAL  Final   Special Requests   Final    BOTTLES DRAWN AEROBIC AND ANAEROBIC Blood Culture adequate volume   Culture   Final    NO GROWTH < 24 HOURS Performed at [facility], [address] [place], [place] [address]    Report Status PENDING  Incomplete  Blood Culture (routine x 2)     Status: None (Preliminary result)   Collection Time: 12/09/23 12:53 PM   Specimen: BLOOD  Result Value Ref Range Status   Specimen Description BLOOD BLOOD RIGHT FOREARM  Final   Special Requests   Final     BOTTLES DRAWN AEROBIC AND ANAEROBIC Blood Culture adequate volume   Culture   Final    NO GROWTH < 24 HOURS Performed at [facility], [address] [place], [place] [address]    Report Status PENDING  Incomplete    Coagulation Studies: No results for input(s): LABPROT, INR in the last 72 hours.  Urinalysis: No results for input(s): COLORURINE, LABSPEC, PHURINE, GLUCOSEU, HGBUR, BILIRUBINUR, KETONESUR, PROTEINUR, UROBILINOGEN, NITRITE, LEUKOCYTESUR in the last 72 hours.  Invalid input(s): APPERANCEUR    Imaging: MR FOOT LEFT WO CONTRAST Result Date: 12/10/2023 CLINICAL DATA:  Soft tissue infection suspected, foot, xray done. Chronic left foot ulcer. EXAM: MRI OF THE LEFT FOOT WITHOUT CONTRAST TECHNIQUE: Multiplanar, multisequence MR imaging of the left was performed. No intravenous contrast was administered. COMPARISON:  Left foot radiographs dated 12/09/2023. MRI of the left foot dated 03/24/2023. FINDINGS: Bones/Joint/Cartilage Soft tissue ulceration at the medial forefoot tracks deep to the underlying level of the first MTP joint. There is marrow edema with confluent T1 hypointensity of the base and proximal half the first proximal phalanx as well as the first metatarsal head extending into the distal diaphysis with trace first MTP joint fluid, most compatible with acute osteomyelitis/septic arthritis. No convincing marrow signal abnormality identified elsewhere to suggest acute osteomyelitis. 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 Degenerative arthropathy of the Lisfranc and first MTP joints. Ligaments Collateral ligaments are intact.  Lisfranc ligament is intact. Muscles and Tendons Diffuse atrophy of the intrinsic foot musculature. No significant tenosynovitis. Soft tissue Soft tissue ulceration overlying the first MTP joint. No abscess. Subcutaneous edema along the dorsal foot. IMPRESSION: 1.  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 No abscess. 2. 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 3. Degenerative arthropathy of the Lisfranc and first MTP joints. Electronically Signed   By: Harrietta Sherry M.D.   On: 12/10/2023 09:03   DG Foot Complete Left Result Date: 12/09/2023 CLINICAL DATA:  foot wound. EXAM: LEFT FOOT - COMPLETE 3+ VIEW COMPARISON:  03/28/2023. FINDINGS: No acute fracture or dislocation. No aggressive osseous lesion. Redemonstration of transmetatarsal amputation of fifth toe. The resection margin is sharp. No focal bone erosions. No overlying soft tissue defect. Diffuse mild-to-moderate degenerative changes of imaged joints. Calcaneal spur noted along the Achilles tendon and Plantar aponeurosis attachment sites. No focal soft tissue swelling. No soft tissue defect or air within the soft tissue. No radiopaque foreign bodies. IMPRESSION: No acute osseous abnormality of the left foot. No radiographic evidence of osteomyelitis. Electronically Signed   By: Ree Molt M.D.   On: 12/09/2023 13:31     Medications:    ceFEPime  (MAXIPIME ) IV 1  g (12/10/23 1608)    aspirin   81 mg Oral Daily   calcitRIOL   0.25 mcg Oral Daily   gabapentin   300 mg Oral TID   heparin   5,000 Units Subcutaneous Q8H   latanoprost   1 drop Right Eye QHS   multivitamin with minerals  1 tablet Oral Daily   sodium bicarbonate   650 mg Oral Daily   vancomycin  variable dose per unstable renal function (pharmacist dosing)   Does not apply See admin instructions   acetaminophen  **OR** acetaminophen , ondansetron  **OR** ondansetron  (ZOFRAN ) IV, senna-docusate  Assessment/ Plan:  Michael Holloway is a 89 y.o.  male with past medical conditions including PVD, bladder cancer, anemia, sick sinus syndrome with PPM, and  ESRD on peritoneal dialysis.  Patient presents to the emergency department for evaluation of left foot wound and has been admitted for Chronic ulcer of great toe of left foot, unspecified ulcer stage (HCC) [L97.529] Ulcer of left foot with muscle involvement without evidence of necrosis (HCC) [L97.525]   End-stage renal disease on peritoneal dialysis.  Recently began training.  Due to stable presentation and labs, we will simply monitor patient for now.  Will not perform nightly peritoneal dialysis at this time.  Awaiting update from outpatient dialysis clinic.  2.  Left foot great toe ulcer, questionable osteomyelitis.  MRI confirms osteomyelitis/septic arthritis.  Broad-spectrum antibiotics ordered by primary team.  3. Anemia of chronic kidney disease Lab Results  Component Value Date   HGB 8.7 (L) 12/10/2023    Hemoglobin slightly decreased.  Will continue to monitor for need of ESA.  4. Secondary Hyperparathyroidism: with outpatient labs: None available Lab Results  Component Value Date   CALCIUM  7.9 (L) 12/10/2023    Will continue to monitor bone minerals during this admission.   LOS: 1 Maycie Luera 7/31/20254:52 PM

## 2023-12-10 NOTE — Consult Note (Signed)
 ORTHOPAEDIC CONSULTATION  REQUESTING PHYSICIAN: Caleen Qualia, MD  Chief Complaint: Worsening left foot infection  HPI: Michael Holloway is a 89 y.o. male who complains of worsening infection to his left foot.  He has been seen for some time now in the outpatient clinic in podiatry.  Unfortunately developed worsening ulceration and infection around his left great toe joint area.  Sent to the hospital for further evaluation and MRI.  MRI is consistent with osteomyelitis of the first metatarsal and base of the proximal phalanx.  Patient with known peripheral neuropathy.  Past Medical History:  Diagnosis Date   Allergy    Arthritis    Bladder cancer (HCC)    Cancer (HCC)    Basal Cell Skin Cancer   Cataract    Chronic kidney disease    Stage 3 CKD   Dysrhythmia    GERD (gastroesophageal reflux disease)    Gout    History of kidney stones    Hypertension    Hypotension 05/24/2020   Neuropathy    Presence of permanent cardiac pacemaker 2020   Spinal stenosis    Past Surgical History:  Procedure Laterality Date   AMPUTATION Left 03/28/2023   Procedure: PARTIAL 5TH RAY AMPUTATION;  Surgeon: Lennie Barter, DPM;  Location: [facility];  Service: Orthopedics/Podiatry;  Laterality: Left;   BACK SURGERY     CERVICAL SPINE SURGERY      X 2   CHOLECYSTECTOMY     CYSTOSCOPY W/ RETROGRADES Bilateral 08/25/2016   Procedure: CYSTOSCOPY WITH RETROGRADE PYELOGRAM;  Surgeon: Rosina Riis, MD;  Location: [facility];  Service: Urology;  Laterality: Bilateral;   CYSTOSCOPY W/ RETROGRADES Bilateral 07/18/2019   Procedure: CYSTOSCOPY WITH RETROGRADE PYELOGRAM;  Surgeon: Riis Rosina, MD;  Location: [facility];  Service: Urology;  Laterality: Bilateral;   CYSTOSCOPY W/ RETROGRADES Bilateral 03/26/2020   Procedure: CYSTOSCOPY WITH RETROGRADE PYELOGRAM;  Surgeon: Riis Rosina, MD;  Location: [facility];  Service: Urology;  Laterality: Bilateral;   EYE SURGERY Bilateral    Cataract Extraction with IOL    JOINT REPLACEMENT Bilateral    total knee replacements   LOOP RECORDER INSERTION N/A 06/03/2017   Procedure: LOOP RECORDER INSERTION;  Surgeon: Ammon Blunt, MD;  Location: [facility];  Service: Cardiovascular;  Laterality: N/A;   LOWER EXTREMITY ANGIOGRAPHY Left 03/27/2023   Procedure: Lower Extremity Angiography;  Surgeon: Marea Selinda RAMAN, MD;  Location: [facility];  Service: Cardiovascular;  Laterality: Left;   LUMBAR LAMINECTOMY      X 4   PACEMAKER INSERTION Left 08/03/2018   Procedure: INSERTION PACEMAKER DUALCHAMBER;  Surgeon: Ammon Blunt, MD;  Location: [facility];  Service: Cardiovascular;  Laterality: Left;   PROSTATE SURGERY  1990s   Dr. Gala ,in office procedure   TONSILLECTOMY     TOTAL KNEE ARTHROPLASTY Right 09/29/2019   Procedure: TOTAL KNEE ARTHROPLASTY;  Surgeon: Edie Norleen PARAS, MD;  Location: [facility];  Service: Orthopedics;  Laterality: Right;   TRANSURETHRAL RESECTION OF BLADDER TUMOR WITH MITOMYCIN -C N/A 08/25/2016   Procedure: TRANSURETHRAL RESECTION OF BLADDER TUMOR WITH MITOMYCIN -C;  Surgeon: Rosina Riis, MD;  Location: [facility];  Service: Urology;  Laterality: N/A;   TRANSURETHRAL RESECTION OF BLADDER TUMOR WITH MITOMYCIN -C N/A 07/18/2019   Procedure: TRANSURETHRAL RESECTION OF BLADDER TUMOR WITH gemcitabine ;  Surgeon: Riis Rosina, MD;  Location: [facility];  Service: Urology;  Laterality: N/A;   TRANSURETHRAL RESECTION OF BLADDER TUMOR WITH MITOMYCIN -C N/A 03/26/2020   Procedure: TRANSURETHRAL RESECTION OF BLADDER TUMOR WITH gemcitabine ;  Surgeon:  Penne Knee, MD;  Location: [facility];  Service: Urology;  Laterality: N/A;   TRANSURETHRAL RESECTION OF PROSTATE N/A 06/14/2018   Procedure: TRANSURETHRAL RESECTION OF THE PROSTATE (TURP) with Gemcitabine ;  Surgeon: Penne Knee, MD;  Location: [facility];  Service: Urology;  Laterality: N/A;   Social History   Socioeconomic History   Marital status: Widowed    Spouse name: Not on file    Number of children: Not on file   Years of education: Not on file   Highest education level: Not on file  Occupational History   Occupation: industrial engineering/ management  Tobacco Use   Smoking status: Former    Current packs/day: 0.00    Average packs/day: 0.5 packs/day for 30.0 years (15.0 ttl pk-yrs)    Types: Cigarettes    Start date: 08/10/1948    Quit date: 08/11/1978    Years since quitting: 45.3    Passive exposure: Past   Smokeless tobacco: Never  Vaping Use   Vaping status: Never Used  Substance and Sexual Activity   Alcohol use: Not Currently    Comment: 1 BEER DAILY   Drug use: No   Sexual activity: Not Currently  Other Topics Concern   Not on file  Social History Narrative   Patient lives with his son, Osman.  He feels safe in his home.   Very active. Goes to the [employer] regularly, works on his computer and putters around his home.   Independant   Social Drivers of Health   Financial Resource Strain: Patient Declined (11/30/2023)   Received from [facility]   Overall Financial Resource Strain (CARDIA)    How hard is it for you to pay for the very basics like food, housing, medical care, and heating?: Patient declined  Food Insecurity: No Food Insecurity (12/09/2023)   Hunger Vital Sign    Worried About Running Out of Food in the Last Year: Never true    Ran Out of Food in the Last Year: Never true  Transportation Needs: No Transportation Needs (12/09/2023)   PRAPARE - Administrator, Civil Service (Medical): No    Lack of Transportation (Non-Medical): No  Physical Activity: Not on file  Stress: No Stress Concern Present (11/16/2023)   Received from [facility] - Occupational Stress Questionnaire    Feeling of Stress : Not at all  Social Connections: Moderately Integrated (12/09/2023)   Social Connection and Isolation Panel    Frequency of Communication with Friends and Family: Three times a week     Frequency of Social Gatherings with Friends and Family: Twice a week    Attends Religious Services: More than 4 times per year    Active Member of [employer] or Organizations: Yes    Attends Banker Meetings: More than 4 times per year    Marital Status: Widowed   Family History  Problem Relation Age of Onset   Cancer Mother    Stomach cancer Mother    Stroke Father    Bladder Cancer Neg Hx    Kidney cancer Neg Hx    Prostate cancer Neg Hx    Allergies  Allergen Reactions   Sulfa Antibiotics Rash   Prior to Admission medications   Medication Sig Start Date End Date Taking? Authorizing Provider  aspirin  EC 81 MG tablet Take 81 mg by mouth daily.   Yes [provider]  calcitRIOL  (ROCALTROL ) 0.25 MCG capsule Take 0.25 mcg by mouth daily. 08/03/23  08/02/24 Yes [provider]  gabapentin  (NEURONTIN ) 600 MG tablet Take 600-900 mg by mouth See admin instructions. Take 600 mg (one tablet) by mouth in the morning and 600 mg (one tablet) in the afternoon and 900 mg (one and one-half tablets) at bedtime 07/22/16  Yes [provider]  gentamicin  cream (GARAMYCIN ) 0.1 % Apply 1 Application topically daily. 11/18/23  Yes [provider]  HYDROcodone -acetaminophen  (NORCO/VICODIN) 5-325 MG tablet Take 1 tablet by mouth every 4 (four) hours as needed for moderate pain (pain score 4-6). 11/16/23  Yes [provider]  latanoprost  (XALATAN ) 0.005 % ophthalmic solution Place 1 drop into the right eye at bedtime. 02/14/21  Yes [provider]  methocarbamol (ROBAXIN) 500 MG tablet Take 500 mg by mouth 3 (three) times daily. 11/16/23  Yes [provider]  Multiple Vitamins-Minerals (CENTRUM SILVER PO) Take 1 tablet by mouth daily.   Yes [provider]  ondansetron  (ZOFRAN ) 4 MG tablet Take 4 mg by mouth every 6 (six) hours as needed for nausea. 11/16/23  Yes [provider]  sodium bicarbonate  650 MG tablet Take 650 mg by mouth 2  (two) times daily. 11/04/23 11/03/24 Yes [provider]  traMADol  (ULTRAM ) 50 MG tablet Take 50 mg by mouth 3 (three) times daily as needed for moderate pain.  Patient not taking: Reported on 12/09/2023 06/16/16   [provider]   DG Foot Complete Left Result Date: 12/09/2023 CLINICAL DATA:  foot wound. EXAM: LEFT FOOT - COMPLETE 3+ VIEW COMPARISON:  03/28/2023. FINDINGS: No acute fracture or dislocation. No aggressive osseous lesion. Redemonstration of transmetatarsal amputation of fifth toe. The resection margin is sharp. No focal bone erosions. No overlying soft tissue defect. Diffuse mild-to-moderate degenerative changes of imaged joints. Calcaneal spur noted along the Achilles tendon and Plantar aponeurosis attachment sites. No focal soft tissue swelling. No soft tissue defect or air within the soft tissue. No radiopaque foreign bodies. IMPRESSION: No acute osseous abnormality of the left foot. No radiographic evidence of osteomyelitis. Electronically Signed   By: Ree Molt M.D.   On: 12/09/2023 13:31    Positive ROS: All other systems have been reviewed and were otherwise negative with the exception of those mentioned in the HPI and as above.  12 point ROS was performed.  Physical Exam: General: Alert and oriented.  No apparent distress.  Vascular:  Left foot:Dorsalis Pedis:  present Posterior Tibial:  present  Right foot: Dorsalis Pedis:  present Posterior Tibial:  present  Neuro:absent protective sensation  Derm: Large full-thickness ulceration to the medial aspect of the left first MTPJ.  Probes immediately down to bone and joint at this time.  Noted drainage from the wound.  Also a small superficial ulcer laterally of the left foot along the fourth metatarsal head distal fifth ray amputation site  Ortho/MS: Patient is status post left fifth ray amputation.  He has noticeable equinus contracture of his left lower extremity at this time  ABIs performed by Dr.  Marea: Summary:  Right: Resting right ankle-brachial index is within normal range. The  right toe-brachial index is normal.   Left: Resting left ankle-brachial index is within normal range. The left  toe-brachial index is abnormal.   Per vascular triphasic waveforms, adequate for healing.  Assessment: Osteomyelitis left first metatarsal Status post left fifth ray amputation Neuropathy Equinus contracture left lower leg  Plan: Patient has obvious osteomyelitis of the left first metatarsal and base of the proximal phalanx.  In need of surgical excision  of the infection.  He is already undergone fifth ray amputation.  I discussed performing a transmetatarsal amputation to allow more functional extremity for ambulation going forward.  Also discussed the possibility of a tendo Achilles lengthening to try to take stress off the forefoot and prevent future issues.  He understood the reasoning behind this.  The risk benefits alternatives and complications associated with surgery were discussed with the patient full consent has been given.  Will plan on surgery tomorrow.    Ashley Eva LABOR, DPM Cell [phone]   12/10/2023 8:44 AM

## 2023-12-10 NOTE — Progress Notes (Signed)
  Device system confirmed to be MRI conditional, with implant date > 6 weeks ago, and no evidence of abandoned or epicardial leads in review of most recent CXR  Device last cleared by EP Provider: riddle 12/09/2023  (Name and date)  Clearance is good through for 1 year as long as parameters remain stable at time of check. If pt undergoes a cardiac device procedure during that time, they should be re-cleared.   Tachy-therapies to be programmed off if applicable with device back to pre-MRI settings after completion of exam.  [employer] - Programming recommendation received through [employer] App/Tablet  Wilbern Knee  12/10/2023 7:24 AM

## 2023-12-10 NOTE — Assessment & Plan Note (Signed)
 Patient has PD catheter in place but has not started the dialysis yet.  Renal function consistent with ESRD -Nephrology was consulted

## 2023-12-11 ENCOUNTER — Inpatient Hospital Stay

## 2023-12-11 ENCOUNTER — Other Ambulatory Visit: Payer: Self-pay

## 2023-12-11 ENCOUNTER — Inpatient Hospital Stay: Admitting: Anesthesiology

## 2023-12-11 ENCOUNTER — Encounter
Admission: EM | Disposition: A | Discharge status: Home Health | Payer: Self-pay | Source: Ambulatory Visit | Attending: Internal Medicine

## 2023-12-11 DIAGNOSIS — L97529 Non-pressure chronic ulcer of other part of left foot with unspecified severity: Secondary | ICD-10-CM | POA: Diagnosis not present

## 2023-12-11 DIAGNOSIS — I495 Sick sinus syndrome: Secondary | ICD-10-CM | POA: Diagnosis not present

## 2023-12-11 DIAGNOSIS — I10 Essential (primary) hypertension: Secondary | ICD-10-CM | POA: Diagnosis not present

## 2023-12-11 DIAGNOSIS — G6289 Other specified polyneuropathies: Secondary | ICD-10-CM | POA: Diagnosis not present

## 2023-12-11 HISTORY — PX: TRANSMETATARSAL AMPUTATION: SHX6197

## 2023-12-11 HISTORY — PX: ACHILLES TENDON LENGTHENING: SHX6455

## 2023-12-11 LAB — VANCOMYCIN, RANDOM: Vancomycin Rm: 13 ug/mL

## 2023-12-11 SURGERY — AMPUTATION, FOOT, TRANSMETATARSAL
Anesthesia: Choice | Site: Foot | Laterality: Left

## 2023-12-11 MED ORDER — PROPOFOL 10 MG/ML IV BOLUS
INTRAVENOUS | Status: AC
Start: 2023-12-11 — End: 2023-12-11
  Filled 2023-12-11: qty 20

## 2023-12-11 MED ORDER — HEPARIN SOD (PORK) LOCK FLUSH 100 UNIT/ML IV SOLN: 500.0000 [IU] | Freq: Every day | INTRAVENOUS | Status: DC | PRN

## 2023-12-11 MED ORDER — CHLORHEXIDINE GLUCONATE 0.12 % MT SOLN
OROMUCOSAL | Status: AC
  Filled 2023-12-11: qty 15

## 2023-12-11 MED ORDER — HEPARIN SOD (PORK) LOCK FLUSH 100 UNIT/ML IV SOLN: 250.0000 [IU] | INTRAVENOUS | Status: DC | PRN

## 2023-12-11 MED ORDER — SODIUM CHLORIDE 0.9% FLUSH: 3.0000 mL | INTRAVENOUS | Status: DC | PRN

## 2023-12-11 MED ORDER — VANCOMYCIN HCL 1750 MG/350ML IV SOLN
1750.0000 mg | Freq: Once | INTRAVENOUS | Status: AC
  Administered 2023-12-11: 1750 mg via INTRAVENOUS
  Filled 2023-12-11: qty 350

## 2023-12-11 MED ORDER — BUPIVACAINE HCL (PF) 0.5 % IJ SOLN
INTRAMUSCULAR | Status: DC | PRN
Start: 2023-12-11 — End: 2023-12-11
  Administered 2023-12-11: 14.5 mL

## 2023-12-11 MED ORDER — BUPIVACAINE-EPINEPHRINE 0.5% -1:200000 IJ SOLN
INTRAMUSCULAR | Status: DC | PRN
Start: 2023-12-11 — End: 2023-12-11
  Administered 2023-12-11: 14.5 mL

## 2023-12-11 MED ORDER — BUPIVACAINE HCL (PF) 0.5 % IJ SOLN
INTRAMUSCULAR | Status: AC
Start: 2023-12-11 — End: 2023-12-11
  Filled 2023-12-11: qty 30

## 2023-12-11 MED ORDER — KETOROLAC TROMETHAMINE 30 MG/ML IJ SOLN
INTRAMUSCULAR | Status: AC
  Filled 2023-12-11: qty 1

## 2023-12-11 MED ORDER — BUPIVACAINE-EPINEPHRINE (PF) 0.5% -1:200000 IJ SOLN
INTRAMUSCULAR | Status: AC
  Filled 2023-12-11: qty 10

## 2023-12-11 MED ORDER — CHLORHEXIDINE GLUCONATE 0.12 % MT SOLN
15.0000 mL | Freq: Once | OROMUCOSAL | Status: AC
  Administered 2023-12-11: 15 mL via OROMUCOSAL

## 2023-12-11 MED ORDER — PROPOFOL 500 MG/50ML IV EMUL
INTRAVENOUS | Status: DC | PRN
  Administered 2023-12-11 (×2): 20 mg via INTRAVENOUS
  Administered 2023-12-11: 40 ug/kg/min via INTRAVENOUS

## 2023-12-11 MED ORDER — 0.9 % SODIUM CHLORIDE (POUR BTL) OPTIME
TOPICAL | Status: DC | PRN
  Administered 2023-12-11: 500 mL

## 2023-12-11 MED ORDER — POVIDONE-IODINE 10 % EX SWAB
2.0000 | Freq: Once | CUTANEOUS | Status: AC
  Administered 2023-12-11: 2 via TOPICAL

## 2023-12-11 MED ORDER — SODIUM CHLORIDE 0.9% FLUSH: 10.0000 mL | INTRAVENOUS | Status: DC | PRN

## 2023-12-11 MED ORDER — LIDOCAINE HCL (PF) 1 % IJ SOLN
INTRAMUSCULAR | Status: AC
  Filled 2023-12-11: qty 30

## 2023-12-11 MED ORDER — SODIUM CHLORIDE 0.9 % IV SOLN: INTRAVENOUS | Status: DC | PRN

## 2023-12-11 SURGICAL SUPPLY — 48 items
BAG COUNTER SPONGE SURGICOUNT (BAG) IMPLANT
BLADE OSC/SAGITTAL 5.5X25 (BLADE) ×3 IMPLANT
BLADE SURG 10 STRL SS (BLADE) IMPLANT
BLADE SURG 15 STRL LF DISP TIS (BLADE) ×3 IMPLANT
BLADE SURG MINI STRL (BLADE) ×6 IMPLANT
BLADE SW THK.38XMED LNG THN (BLADE) IMPLANT
BNDG COHESIVE 4X5 TAN STRL LF (GAUZE/BANDAGES/DRESSINGS) ×3 IMPLANT
BNDG ELASTIC 4X5.8 VLCR NS LF (GAUZE/BANDAGES/DRESSINGS) ×3 IMPLANT
BNDG ESMARCH 4X12 STRL LF (GAUZE/BANDAGES/DRESSINGS) ×3 IMPLANT
BNDG GAUZE DERMACEA FLUFF 4 (GAUZE/BANDAGES/DRESSINGS) ×3 IMPLANT
BNDG STRETCH 4X75 STRL LF (GAUZE/BANDAGES/DRESSINGS) ×3 IMPLANT
BNDG STRETCH GAUZE 3IN X12FT (GAUZE/BANDAGES/DRESSINGS) ×3 IMPLANT
CNTNR URN SCR LID CUP LEK RST (MISCELLANEOUS) ×3 IMPLANT
CUFF TOURN SGL QUICK 12 (TOURNIQUET CUFF) IMPLANT
CUFF TOURN SGL QUICK 18X4 (TOURNIQUET CUFF) IMPLANT
DRAIN PENROSE 12X.25 LTX STRL (MISCELLANEOUS) IMPLANT
DRAPE FLUOR MINI C-ARM 54X84 (DRAPES) ×3 IMPLANT
DURAPREP 26ML APPLICATOR (WOUND CARE) ×3 IMPLANT
ELECTRODE REM PT RTRN 9FT ADLT (ELECTROSURGICAL) ×3 IMPLANT
GAUZE SPONGE 4X4 12PLY STRL (GAUZE/BANDAGES/DRESSINGS) ×6 IMPLANT
GAUZE XEROFORM 1X8 LF (GAUZE/BANDAGES/DRESSINGS) ×3 IMPLANT
GLOVE BIO SURGEON STRL SZ7.5 (GLOVE) ×3 IMPLANT
GLOVE INDICATOR 8.0 STRL GRN (GLOVE) ×3 IMPLANT
GOWN STRL REUS W/ TWL XL LVL3 (GOWN DISPOSABLE) ×6 IMPLANT
GOWN STRL REUS W/TWL XL LVL4 (GOWN DISPOSABLE) ×3 IMPLANT
HANDLE YANKAUER SUCT BULB TIP (MISCELLANEOUS) IMPLANT
KIT TURNOVER KIT A (KITS) ×3 IMPLANT
LABEL OR SOLS (LABEL) ×3 IMPLANT
MANIFOLD NEPTUNE II (INSTRUMENTS) ×3 IMPLANT
NDL HYPO 25X1 1.5 SAFETY (NEEDLE) ×6 IMPLANT
NDL SAFETY ECLIPSE 18X1.5 (NEEDLE) ×3 IMPLANT
NEEDLE HYPO 25X1 1.5 SAFETY (NEEDLE) ×4 IMPLANT
NS IRRIG 500ML POUR BTL (IV SOLUTION) ×3 IMPLANT
PACK EXTREMITY ARMC (MISCELLANEOUS) ×3 IMPLANT
PAD ABD DERMACEA PRESS 5X9 (GAUZE/BANDAGES/DRESSINGS) ×6 IMPLANT
PENCIL SMOKE EVACUATOR (MISCELLANEOUS) ×3 IMPLANT
POWDER SURGICEL 3.0 GRAM (HEMOSTASIS) IMPLANT
SOLUTION PREP PVP 2OZ (MISCELLANEOUS) ×3 IMPLANT
SPONGE T-LAP 18X18 ~~LOC~~+RFID (SPONGE) ×3 IMPLANT
STOCKINETTE M/LG 89821 (MISCELLANEOUS) ×3 IMPLANT
STOCKINETTE STRL 6IN 960660 (GAUZE/BANDAGES/DRESSINGS) ×3 IMPLANT
STRIP CLOSURE SKIN 1/4X4 (GAUZE/BANDAGES/DRESSINGS) ×3 IMPLANT
SUT VIC AB 2-0 SH 27XBRD (SUTURE) ×3 IMPLANT
SUTURE EHLN 3-0 FS-10 30 BLK (SUTURE) ×3 IMPLANT
SWAB CULTURE AMIES ANAERIB BLU (MISCELLANEOUS) IMPLANT
SYR 10ML LL (SYRINGE) ×6 IMPLANT
TRAP FLUID SMOKE EVACUATOR (MISCELLANEOUS) ×3 IMPLANT
WATER STERILE IRR 500ML POUR (IV SOLUTION) ×3 IMPLANT

## 2023-12-11 NOTE — Progress Notes (Signed)
 [facility]  ROUNDING NOTE   Subjective:   Michael Holloway is a 89 year old male with past medical conditions including PVD, bladder cancer, anemia, sick sinus syndrome with PPM, and ESRD on peritoneal dialysis.  Patient presents to the emergency department for evaluation of left foot wound and has been admitted for Chronic ulcer of great toe of left foot, unspecified ulcer stage (HCC) [L97.529] Ulcer of left foot with muscle involvement without evidence of necrosis [facility]) [L97.525]  Patient is known our practice and is followed outpatient by Dr. Douglas at [facility] for peritoneal dialysis.    Patient seen prior to surgical procedure Currently n.p.o.   Objective:  Vital signs in last 24 hours:  Temp:  [97 F (36.1 C)-98.9 F (37.2 C)] 97.2 F (36.2 C) (08/01 1230) Pulse Rate:  [59-80] 79 (08/01 1230) Resp:  [14-20] 17 (08/01 1230) BP: (108-156)/(51-85) 156/85 (08/01 1230) SpO2:  [98 %-100 %] 99 % (08/01 1230) Weight:  [81.6 kg] 81.6 kg (08/01 0851)  Weight change:  Filed Weights   12/09/23 1134 12/11/23 0851  Weight: 81.6 kg 81.6 kg    Intake/Output: I/O last 3 completed shifts: In: 720 [P.O.:720] Out: 250 [Urine:250]   Intake/Output this shift:  Total I/O In: 40 [I.V.:40] Out: -   Physical Exam: General: NAD, resting in bed  Head: Normocephalic, atraumatic. Moist oral mucosal membranes  Eyes: Anicteric  Neck: Supple  Lungs:  Clear to auscultation, normal effort  Heart: Regular rate and rhythm  Abdomen:  Soft, nontender, PDC  Extremities: No peripheral edema.  Neurologic: Awake, alert, conversant  Skin: Warm,dry, no rash.  Left foot surgical dressing  Access: PD catheter    Basic Metabolic Panel: Recent Labs  Lab 12/09/23 1136 12/10/23 0352  NA 139 142  K 4.3 3.9  CL 105 111  CO2 22 22  GLUCOSE 93 73  BUN 57* 57*  CREATININE 5.91* 5.84*  CALCIUM  8.3* 7.9*    Liver Function Tests: Recent Labs  Lab 12/09/23 1136  AST 16  ALT  7  ALKPHOS 70  BILITOT 0.6  PROT 6.2*  ALBUMIN 3.1*   No results for input(s): LIPASE, AMYLASE in the last 168 hours. No results for input(s): AMMONIA in the last 168 hours.  CBC: Recent Labs  Lab 12/09/23 1136 12/10/23 0352  WBC 5.8 6.3  NEUTROABS 4.3  --   HGB 10.0* 8.7*  HCT 32.7* 28.3*  MCV 93.7 93.1  PLT 217 193    Cardiac Enzymes: No results for input(s): CKTOTAL, CKMB, CKMBINDEX, TROPONINI in the last 168 hours.  BNP: Invalid input(s): POCBNP  CBG: No results for input(s): GLUCAP in the last 168 hours.  Microbiology: Results for orders placed or performed during the hospital encounter of 12/09/23  Blood Culture (routine x 2)     Status: None (Preliminary result)   Collection Time: 12/09/23 12:53 PM   Specimen: BLOOD  Result Value Ref Range Status   Specimen Description BLOOD RIGHT ANTECUBITAL  Final   Special Requests   Final    BOTTLES DRAWN AEROBIC AND ANAEROBIC Blood Culture adequate volume   Culture   Final    NO GROWTH 2 DAYS Performed at [facility], [address] [place], [place] [address]    Report Status PENDING  Incomplete  Blood Culture (routine x 2)     Status: None (Preliminary result)   Collection Time: 12/09/23 12:53 PM   Specimen: BLOOD  Result Value Ref Range Status   Specimen Description BLOOD BLOOD RIGHT  FOREARM  Final   Special Requests   Final    BOTTLES DRAWN AEROBIC AND ANAEROBIC Blood Culture adequate volume   Culture   Final    NO GROWTH 2 DAYS Performed at [facility], [address], [place], [place] [address]    Report Status PENDING  Incomplete  Surgical PCR screen     Status: Abnormal   Collection Time: 12/10/23  9:13 PM   Specimen: Nasal Mucosa; Nasal Swab  Result Value Ref Range Status   MRSA, PCR NEGATIVE NEGATIVE Final   Staphylococcus aureus POSITIVE (A) NEGATIVE Final    Comment: (NOTE) The Xpert SA Assay (FDA approved for NASAL specimens in patients 74 years of age  and older), is one component of a comprehensive surveillance program. It is not intended to diagnose infection nor to guide or monitor treatment. Performed at [facility], [address] [place], [place] [address]     Coagulation Studies: No results for input(s): LABPROT, INR in the last 72 hours.  Urinalysis: No results for input(s): COLORURINE, LABSPEC, PHURINE, GLUCOSEU, HGBUR, BILIRUBINUR, KETONESUR, PROTEINUR, UROBILINOGEN, NITRITE, LEUKOCYTESUR in the last 72 hours.  Invalid input(s): APPERANCEUR    Imaging: DG MINI C-ARM IMAGE ONLY Result Date: 12/11/2023 There is no interpretation for this exam.  This order is for images obtained during a surgical procedure.  Please See Surgeries Tab for more information regarding the procedure.   MR FOOT LEFT WO CONTRAST Result Date: 12/10/2023 CLINICAL DATA:  Soft tissue infection suspected, foot, xray done. Chronic left foot ulcer. EXAM: MRI OF THE LEFT FOOT WITHOUT CONTRAST TECHNIQUE: Multiplanar, multisequence MR imaging of the left was performed. No intravenous contrast was administered. COMPARISON:  Left foot radiographs dated 12/09/2023. MRI of the left foot dated 03/24/2023. FINDINGS: Bones/Joint/Cartilage Soft tissue ulceration at the medial forefoot tracks deep to the underlying level of the first MTP joint. There is marrow edema with confluent T1 hypointensity of the base and proximal half the first proximal phalanx as well as the first metatarsal head extending into the distal diaphysis with trace first MTP joint fluid, most compatible with acute osteomyelitis/septic arthritis. No convincing marrow signal abnormality identified elsewhere to suggest acute osteomyelitis. 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 Degenerative arthropathy of the Lisfranc and first MTP joints. Ligaments Collateral ligaments are intact.  Lisfranc ligament is intact. Muscles and  Tendons Diffuse atrophy of the intrinsic foot musculature. No significant tenosynovitis. Soft tissue Soft tissue ulceration overlying the first MTP joint. No abscess. Subcutaneous edema along the dorsal foot. IMPRESSION: 1. 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 No abscess. 2. 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 3. Degenerative arthropathy of the Lisfranc and first MTP joints. Electronically Signed   By: Harrietta Sherry M.D.   On: 12/10/2023 09:03     Medications:    ceFEPime  (MAXIPIME ) IV 1 g (12/10/23 1608)    aspirin   81 mg Oral Daily   calcitRIOL   0.25 mcg Oral Daily   gabapentin   300 mg Oral TID   heparin   5,000 Units Subcutaneous Q8H   latanoprost   1 drop Right Eye QHS   multivitamin with minerals  1 tablet Oral Daily   mupirocin  ointment  1 Application Nasal BID   sodium bicarbonate   650 mg Oral Daily  vancomycin  variable dose per unstable renal function (pharmacist dosing)   Does not apply See admin instructions   acetaminophen  **OR** acetaminophen , heparin  lock flush, heparin  lock flush, heparin  lock flush, heparin  lock flush, ondansetron  **OR** ondansetron  (ZOFRAN ) IV, senna-docusate, sodium chloride  flush, sodium chloride  flush, sodium chloride  flush, sodium chloride  flush  Assessment/ Plan:  Michael Holloway is a 89 y.o.  male with past medical conditions including PVD, bladder cancer, anemia, sick sinus syndrome with PPM, and ESRD on peritoneal dialysis.  Patient presents to the emergency department for evaluation of left foot wound and has been admitted for Chronic ulcer of great toe of left foot, unspecified ulcer stage (HCC) [L97.529] Ulcer of left foot with muscle involvement without evidence of necrosis (HCC) [L97.525]   End-stage renal disease on peritoneal dialysis.   Began peritoneal dialysis training on Monday.  Patient remained stable without respiratory distress or electrolyte imbalances.  Will continue to monitor labs and patient presentation.  Will hold peritoneal dialysis for now.  Will monitor discharge plan.  2.  Left foot great toe ulcer, questionable osteomyelitis.  MRI confirms osteomyelitis/septic arthritis.  Broad-spectrum antibiotics ordered by primary team.  Left forefoot TMA performed on 8/1.  3. Anemia of chronic kidney disease Lab Results  Component Value Date   HGB 8.7 (L) 12/10/2023    Hemoglobin decreased.  Will continue to monitor for need of ESA.  4. Secondary Hyperparathyroidism: with outpatient labs: None available Lab Results  Component Value Date   CALCIUM  7.9 (L) 12/10/2023    Calcium  slightly decreased.  Will continue to monitor for now   LOS: 2 Aliegha Paullin 8/1/20252:43 PM

## 2023-12-11 NOTE — Transfer of Care (Signed)
 Immediate Anesthesia Transfer of Care Note  Patient: Michael Holloway  Procedure(s) Performed: AMPUTATION, FOOT, TRANSMETATARSAL (Left: Foot) LENGTHENING, TENDON, ACHILLES (Left)  Patient Location: PACU  Anesthesia Type:General  Level of Consciousness: awake, alert , and oriented  Airway & Oxygen Therapy: Patient Spontanous Breathing  Post-op Assessment: Report given to RN and Post -op Vital signs reviewed and stable  Post vital signs: Reviewed and stable  Last Vitals:  Vitals Value Taken Time  BP 108/51 12/11/23 11:45  Temp    Pulse 70 12/11/23 11:48  Resp 12 12/11/23 11:48  SpO2 100 % 12/11/23 11:48  Vitals shown include unfiled device data.  Last Pain:  Vitals:   12/11/23 0851  TempSrc: Oral  PainSc: 0-No pain         Complications: No notable events documented.

## 2023-12-11 NOTE — Plan of Care (Signed)

## 2023-12-11 NOTE — Progress Notes (Signed)
  Progress Note   Patient: Michael Holloway FMW:[id number] DOB: [birth year] DOA: 12/09/2023     2 DOS: the patient was seen and examined on 12/11/2023   Brief hospital course: Taken from H&P.  Michael Holloway is a 89 y.o. male with medical history significant of   peripheral vascular disease status post stent placement, peripheral neuropathy, bladder cancer,  ESRD s/p peritoneal dialysis catheter insertion but not yet initiated PD, anemia due to chronic kidney disease,  sick sinus syndrome s/p PPM, sent from podiatry office for admission for operative intervention of chronic left foot ulcer.   On presentation hemodynamically stable, labs mostly , x-ray of the left foot without any evidence of osteomyelitis and MRI was ordered. Patient was started on cefepime  and vancomycin .  Planned OR on Friday.  7/31: Vital stable, labs with decrease of hemoglobin to 8.7-no obvious bleeding, creatinine of 5.84, preliminary blood cultures negative.  MRI of left foot with osteomyelitis/septic arthritis of first metatarsal head and neck along with the base/proximal half of the first proximal phalanx.  No abscess.  8/1: Hemodynamically stable, s/p TMA today with podiatry. Nephrology is holding the start of PD for now and closely monitoring, likely be started from Monday as outpatient.  Assessment and Plan: * Chronic ulcer of great toe of left foot, unspecified ulcer stage (HCC) Osteomyelitis of first phalanx and joint. S/p TMA today -Continue with broad-spectrum antibiotics -Supportive care -PT/OT evaluation after the procedure  ESRD (end stage renal disease) (HCC) Patient has PD catheter in place but has not started the dialysis yet.  Renal function consistent with ESRD -Nephrology was consulted-they will likely start earlier next week as outpatient. - Closely monitor renal function  Peripheral neuropathy - Continue gabapentin   Hypertension Blood pressure mildly elevated, not on any antihypertensives at  home. - Continue to monitor  Sick sinus syndrome [facility]) S/p permanent pacemaker in place. - Continue to monitor   Subjective: Patient with no new concern.  Physical Exam: Vitals:   12/11/23 1200 12/11/23 1215 12/11/23 1230 12/11/23 1515  BP: 131/72 (!) 151/77 (!) 156/85 (!) 165/88  Pulse: 63 80 79 85  Resp: 20 15 17 18   Temp:   (!) 97.2 F (36.2 C) 97.6 F (36.4 C)  TempSrc:   Oral   SpO2: 100% 99% 99% 100%  Weight:      Height:       General.  Frail elderly man, in no acute distress. Pulmonary.  Lungs clear bilaterally, normal respiratory effort. CV.  Regular rate and rhythm, no JVD, rub or murmur. Abdomen.  Soft, nontender, nondistended, BS positive. CNS.  Alert and oriented .  No focal neurologic deficit. Extremities.  Left foot with Ace wrap Psychiatry.  Judgment and insight appears normal.   Data Reviewed: Prior data reviewed  Family Communication:   Disposition: Status is: Inpatient Remains inpatient appropriate because: Severity of illness  Planned Discharge Destination: To be determined  DVT Prophylaxis. Lovenox  Time spent: 45 minutes  This record has been created using Conservation officer, historic buildings. Errors have been sought and corrected,but may not always be located. Such creation errors do not reflect on the standard of care.   Author: Amaryllis Dare, MD 12/11/2023 3:44 PM  For on call review [email or web address].

## 2023-12-11 NOTE — Consult Note (Signed)
 Pharmacy Antibiotic Note  Michael Holloway is a 89 y.o. male admitted on 12/09/2023 with chronic left foot ulcer.  Pharmacy has been consulted for vancomycin  and cefepime  dosing.  Per H&P patient is ESRD but has not started PD yet  Vancomycin  1000 mg IV x 1 given 7/30 @ 1551  Plan: Give vancomycin  750 mg IV x 1 to complete 1750 mg loading dose Dose vancomycin  on levels due to unstable renal function Will order vancomycin  random level for 8/1 with AM labs Start cefepime  1 gram IV every 24 hours Adjust based on renal function and levels  8/1:  Vanc level @ 0437 = 13, SUBtherapeutic - Vanc level ~ 36 hrs following 1750 mg IV X 1 dose - will reorder Vanc 1750 mg IV X 1 but recheck vanc level in 24 hrs on 8/2 @ 0600.   Height: 6' 4 (193 cm) Weight: 81.6 kg (180 lb) IBW/kg (Calculated) : 86.8  Temp (24hrs), Avg:98.4 F (36.9 C), Min:98.1 F (36.7 C), Max:98.9 F (37.2 C)  Recent Labs  Lab 12/09/23 1136 12/10/23 0352 12/11/23 0437  WBC 5.8 6.3  --   CREATININE 5.91* 5.84*  --   LATICACIDVEN 1.2  --   --   VANCORANDOM  --   --  13    Estimated Creatinine Clearance: 9.1 mL/min (A) (by C-G formula based on SCr of 5.84 mg/dL (H)).    Allergies  Allergen Reactions   Sulfa Antibiotics Rash    Antimicrobials this admission: Cefepime  7/30 >>  vancomycin  7/30 >>   Microbiology results: 7/30 BCx: pending   Thank you for allowing pharmacy to be a part of this patient's care.  Michael Holloway D, PharmD 12/11/2023 5:38 AM

## 2023-12-11 NOTE — Assessment & Plan Note (Signed)
 Patient has PD catheter in place but has not started the dialysis yet.  Renal function consistent with ESRD -Nephrology was consulted-they will likely start earlier next week as outpatient. - Closely monitor renal function

## 2023-12-11 NOTE — Assessment & Plan Note (Signed)
 Osteomyelitis of first phalanx and joint. S/p TMA today -Continue with broad-spectrum antibiotics -Supportive care -PT/OT evaluation after the procedure

## 2023-12-11 NOTE — Op Note (Signed)
 Operative note   Surgeon:Cassidey Barrales Armed forces logistics/support/administrative officer: None    Preop diagnosis: 1.  Osteomyelitis left first metatarsal phalangeal joint 2.  Equinus contracture left lower leg    Postop diagnosis: Same    Procedure: 1.  Transmetatarsal amputation left forefoot 2.  Percutaneous tendo Achilles lengthening left lower extremity    EBL: 20 cc    Anesthesia:local and general.    Hemostasis: Mid calf tourniquet inflated to 200 mmHg for 9 minutes    Specimen: Transmetatarsal amputation with proximal margin inked.  Wound culture to proximal wound    Complications: None    Operative indications:Michael Holloway is an 89 y.o. that presents today for surgical intervention.  The risks/benefits/alternatives/complications have been discussed and consent has been given.    Procedure:  Patient was brought into the OR and placed on the operating table in thesupine position. After anesthesia was obtained theleft lower extremity was prepped and draped in usual sterile fashion.  Attention was initially directed to the posterior aspect of the left Achilles tendon where at 1 3 and 5 cm to medial and 1 in the lateral Hemi sections were performed.  Good excursion of the Achilles tendon was noted.  The incision sites were closed with a 3-0 nylon.  Next inflation of the tourniquet at occurred and dorsal and plantar skin flaps were mapped out.  Full-thickness flaps were created proximal along the metatarsal region.  This is taken to about the mid shaft area.  Osteotomies were created on 1 through 4 metatarsals.  The fifth metatarsal had been previously excised and well-healed.  At this time the remaining forefoot was then excised and removed from the surgical field in toto.  The tourniquet was released.  All bleeders were Bovie cauterized as appropriate.  The wound was flushed with copious amounts of irrigation.  Closure was then performed with a 3-0 Vicryl for the subcutaneous tissue and 3-0 nylon for the skin.  A  large bulky sterile dressing was then applied.    Patient tolerated the procedure and anesthesia well.  Was transported from the OR to the PACU with all vital signs stable and vascular status intact. To be discharged per routine protocol.  Will follow up in approximately 1 week in the outpatient clinic.

## 2023-12-12 DIAGNOSIS — D631 Anemia in chronic kidney disease: Secondary | ICD-10-CM

## 2023-12-12 DIAGNOSIS — N185 Chronic kidney disease, stage 5: Secondary | ICD-10-CM

## 2023-12-12 DIAGNOSIS — G6289 Other specified polyneuropathies: Secondary | ICD-10-CM | POA: Diagnosis not present

## 2023-12-12 DIAGNOSIS — D649 Anemia, unspecified: Secondary | ICD-10-CM | POA: Diagnosis not present

## 2023-12-12 DIAGNOSIS — L97529 Non-pressure chronic ulcer of other part of left foot with unspecified severity: Secondary | ICD-10-CM | POA: Diagnosis not present

## 2023-12-12 LAB — BASIC METABOLIC PANEL WITH GFR
Anion gap: 12 (ref 5–15)
BUN: 59 mg/dL — ABNORMAL HIGH (ref 8–23)
CO2: 20 mmol/L — ABNORMAL LOW (ref 22–32)
Calcium: 8.5 mg/dL — ABNORMAL LOW (ref 8.9–10.3)
Chloride: 110 mmol/L (ref 98–111)
Creatinine, Ser: 5.65 mg/dL — ABNORMAL HIGH (ref 0.61–1.24)
GFR, Estimated: 9 mL/min — ABNORMAL LOW (ref >60)
Glucose, Bld: 82 mg/dL (ref 70–99)
Potassium: 4 mmol/L (ref 3.5–5.1)
Sodium: 142 mmol/L (ref 135–145)

## 2023-12-12 LAB — CBC
HCT: 30.6 % — ABNORMAL LOW (ref 39.0–52.0)
Hemoglobin: 9.5 g/dL — ABNORMAL LOW (ref 13.0–17.0)
MCH: 29 pg (ref 26.0–34.0)
MCHC: 31 g/dL (ref 30.0–36.0)
MCV: 93.3 fL (ref 80.0–100.0)
Platelets: 212 K/uL (ref 150–400)
RBC: 3.28 MIL/uL — ABNORMAL LOW (ref 4.22–5.81)
RDW: 13.8 % (ref 11.5–15.5)
WBC: 7.9 K/uL (ref 4.0–10.5)
nRBC: 0 % (ref 0.0–0.2)

## 2023-12-12 MED ORDER — DOXYCYCLINE HYCLATE 100 MG PO TABS: 100.0000 mg | ORAL_TABLET | Freq: Two times a day (BID) | ORAL | 0 refills | Status: AC

## 2023-12-12 MED ORDER — SODIUM BICARBONATE 650 MG PO TABS: 650.0000 mg | ORAL_TABLET | Freq: Three times a day (TID) | ORAL | 11 refills | Status: AC

## 2023-12-12 MED ORDER — SODIUM BICARBONATE 650 MG PO TABS
650.0000 mg | ORAL_TABLET | Freq: Three times a day (TID) | ORAL | Status: DC
  Administered 2023-12-12: 650 mg via ORAL
  Filled 2023-12-12: qty 1

## 2023-12-12 NOTE — Discharge Summary (Signed)
 Physician Discharge Summary   Patient: Michael Holloway MRN: [id number] DOB: 1928-10-30  Admit date:     12/09/2023  Discharge date: 12/12/23  Discharge Physician: Amaryllis Dare   PCP: Pcp, No   Recommendations at discharge:  Please obtain CBC and renal function on follow-up Follow-up with nephrology to start on peritoneal dialysis early next week Follow-up with podiatry within a week Please follow-up on final culture results and intraoperative pathology results  Discharge Diagnoses: Principal Problem:   Chronic ulcer of great toe of left foot, unspecified ulcer stage (HCC) Active Problems:   ESRD (end stage renal disease) (HCC)   Peripheral neuropathy   Hypertension   Sick sinus syndrome (HCC)   Symptomatic anemia   Hospital Course: Taken from H&P.  LEANDREW KEECH is a 89 y.o. male with medical history significant of   peripheral vascular disease status post stent placement, peripheral neuropathy, bladder cancer,  ESRD s/p peritoneal dialysis catheter insertion but not yet initiated PD, anemia due to chronic kidney disease,  sick sinus syndrome s/p PPM, sent from podiatry office for admission for operative intervention of chronic left foot ulcer.   On presentation hemodynamically stable, labs mostly , x-ray of the left foot without any evidence of osteomyelitis and MRI was ordered. Patient was started on cefepime  and vancomycin .  Planned OR on Friday.  7/31: Vital stable, labs with decrease of hemoglobin to 8.7-no obvious bleeding, creatinine of 5.84, preliminary blood cultures negative.  MRI of left foot with osteomyelitis/septic arthritis of first metatarsal head and neck along with the base/proximal half of the first proximal phalanx.  No abscess.  8/1: Hemodynamically stable, s/p TMA today with podiatry. Nephrology is holding the start of PD for now and closely monitoring, likely be started from Monday as outpatient.  8/2: Vitals and labs stable, bicarb at 20s so home dose  of bicarb was increased from twice daily to 3 times daily.  Patient will follow-up closely with his nephrologist to start on peritoneal dialysis.  Pain seems well-controlled.  Podiatrist changed the dressing and cleared him for discharge.  They will follow-up closely as outpatient for further assistance. Intraoperative cultures with no organism so far, he is being discharged on doxycycline  for few more days as recommended by podiatry.  They will follow-up cultures and pathology results.  Patient will remain nonweightbearing on left foot except heel which he can only use for transfers and for bathroom privileges.  He need to wear surgical boot all the time.  Our physical therapist evaluated him and recommended home health services which was ordered.  Patient also lives with son who is going to assist with his ADLs.  He will continue on current medications and need to have a close follow-up with his providers for further assistance.  Assessment and Plan: * Chronic ulcer of great toe of left foot, unspecified ulcer stage (HCC) Osteomyelitis of first phalanx and joint. S/p TMA today Intraoperative cultures with no organisms and blood cultures remain negative.  Patient received broad-spectrum antibiotics while in the hospital and is being discharged on doxycycline  as recommended by podiatry.  ESRD (end stage renal disease) (HCC) Patient has PD catheter in place but has not started the dialysis yet.  Renal function consistent with ESRD -Nephrology was consulted-they will likely start earlier next week as outpatient. - Closely monitor renal function  Peripheral neuropathy - Continue gabapentin   Hypertension Blood pressure mildly elevated, not on any antihypertensives at home. - Continue to monitor  Sick sinus syndrome [facility]) S/p permanent pacemaker  in place. - Continue to monitor   Pain control - [place]  Controlled Substance Reporting System database was reviewed. and patient was  instructed, not to drive, operate heavy machinery, perform activities at heights, swimming or participation in water activities or provide baby-sitting services while on Pain, Sleep and Anxiety Medications; until their outpatient Physician has advised to do so again. Also recommended to not to take more than prescribed Pain, Sleep and Anxiety Medications.  Consultants: Nephrology.  Podiatry Procedures performed: TMA Disposition: Home health Diet recommendation:  Discharge Diet Orders (From admission, onward)     Start     Ordered   12/12/23 0000  Diet - low sodium heart healthy        12/12/23 1225           Renal diet DISCHARGE MEDICATION: Allergies as of 12/12/2023       Reactions   Sulfa Antibiotics Rash        Medication List     STOP taking these medications    traMADol  50 MG tablet Commonly known as: ULTRAM        TAKE these medications    aspirin  EC 81 MG tablet Take 81 mg by mouth daily.   calcitRIOL  0.25 MCG capsule Commonly known as: ROCALTROL  Take 0.25 mcg by mouth daily.   CENTRUM SILVER PO Take 1 tablet by mouth daily.   doxycycline  100 MG tablet Commonly known as: VIBRA -TABS Take 1 tablet (100 mg total) by mouth 2 (two) times daily for 7 days.   gabapentin  600 MG tablet Commonly known as: NEURONTIN  Take 300 mg by mouth 3 (three) times daily.   gentamicin  cream 0.1 % Commonly known as: GARAMYCIN  Apply 1 Application topically daily.   HYDROcodone -acetaminophen  5-325 MG tablet Commonly known as: NORCO/VICODIN Take 1 tablet by mouth every 4 (four) hours as needed for moderate pain (pain score 4-6).   latanoprost  0.005 % ophthalmic solution Commonly known as: XALATAN  Place 1 drop into the right eye at bedtime.   methocarbamol 500 MG tablet Commonly known as: ROBAXIN Take 500 mg by mouth 3 (three) times daily.   ondansetron  4 MG tablet Commonly known as: ZOFRAN  Take 4 mg by mouth every 6 (six) hours as needed for nausea.   sodium  bicarbonate 650 MG tablet Take 1 tablet (650 mg total) by mouth 3 (three) times daily. What changed: when to take this               Discharge Care Instructions  (From admission, onward)           Start     Ordered   12/12/23 0000  Leave dressing on - Keep it clean, dry, and intact until clinic visit        12/12/23 1225            Follow-up Information     Ashley Soulier, DPM Follow up in 1 week(s).   Specialty: Podiatry Contact information: [address] [place] [address] [phone]                Discharge Exam: Fredricka Weights   12/09/23 1134 12/11/23 0851  Weight: 81.6 kg 81.6 kg   General.  Frail elderly man, in no acute distress. Pulmonary.  Lungs clear bilaterally, normal respiratory effort. CV.  Regular rate and rhythm, no JVD, rub or murmur. Abdomen.  Soft, nontender, nondistended, BS positive. CNS.  Alert and oriented .  No focal neurologic deficit. Extremities.  No edema, left foot with Ace wrap and  surgical boot. Psychiatry.  Judgment and insight appears normal.   Condition at discharge: stable  The results of significant diagnostics from this hospitalization (including imaging, microbiology, ancillary and laboratory) are listed below for reference.   Imaging Studies: DG MINI C-ARM IMAGE ONLY Result Date: 12/11/2023 There is no interpretation for this exam.  This order is for images obtained during a surgical procedure.  Please See Surgeries Tab for more information regarding the procedure.   MR FOOT LEFT WO CONTRAST Result Date: 12/10/2023 CLINICAL DATA:  Soft tissue infection suspected, foot, xray done. Chronic left foot ulcer. EXAM: MRI OF THE LEFT FOOT WITHOUT CONTRAST TECHNIQUE: Multiplanar, multisequence MR imaging of the left was performed. No intravenous contrast was administered. COMPARISON:  Left foot radiographs dated 12/09/2023. MRI of the left foot dated 03/24/2023. FINDINGS: Bones/Joint/Cartilage Soft tissue  ulceration at the medial forefoot tracks deep to the underlying level of the first MTP joint. There is marrow edema with confluent T1 hypointensity of the base and proximal half the first proximal phalanx as well as the first metatarsal head extending into the distal diaphysis with trace first MTP joint fluid, most compatible with acute osteomyelitis/septic arthritis. No convincing marrow signal abnormality identified elsewhere to suggest acute osteomyelitis. 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 Degenerative arthropathy of the Lisfranc and first MTP joints. Ligaments Collateral ligaments are intact.  Lisfranc ligament is intact. Muscles and Tendons Diffuse atrophy of the intrinsic foot musculature. No significant tenosynovitis. Soft tissue Soft tissue ulceration overlying the first MTP joint. No abscess. Subcutaneous edema along the dorsal foot. IMPRESSION: 1. 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 No abscess. 2. 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 3. Degenerative arthropathy of the Lisfranc and first MTP joints. Electronically Signed   By: Harrietta Sherry M.D.   On: 12/10/2023 09:03   DG Foot Complete Left Result Date: 12/09/2023 CLINICAL DATA:  foot wound. EXAM: LEFT FOOT - COMPLETE 3+ VIEW COMPARISON:  03/28/2023. FINDINGS: No acute fracture or dislocation. No aggressive osseous lesion. Redemonstration of transmetatarsal amputation of fifth toe. The resection margin is sharp. No focal bone erosions. No overlying soft tissue defect. Diffuse mild-to-moderate degenerative changes of imaged joints. Calcaneal spur noted along the Achilles tendon and Plantar aponeurosis attachment sites. No focal soft tissue swelling. No soft tissue defect or air within the soft tissue.  No radiopaque foreign bodies. IMPRESSION: No acute osseous abnormality of the left foot. No radiographic evidence of osteomyelitis. Electronically Signed   By: Ree Molt M.D.   On: 12/09/2023 13:31    Microbiology: Results for orders placed or performed during the hospital encounter of 12/09/23  Blood Culture (routine x 2)     Status: None (Preliminary result)   Collection Time: 12/09/23 12:53 PM   Specimen: BLOOD  Result Value Ref Range Status   Specimen Description BLOOD RIGHT ANTECUBITAL  Final   Special Requests   Final    BOTTLES DRAWN AEROBIC AND ANAEROBIC Blood Culture adequate volume   Culture   Final    NO GROWTH 3 DAYS Performed at [facility], [address], [place], [place] [address]    Report Status PENDING  Incomplete  Blood Culture (routine x 2)     Status: None (Preliminary result)   Collection Time: 12/09/23 12:53  PM   Specimen: BLOOD  Result Value Ref Range Status   Specimen Description BLOOD BLOOD RIGHT FOREARM  Final   Special Requests   Final    BOTTLES DRAWN AEROBIC AND ANAEROBIC Blood Culture adequate volume   Culture   Final    NO GROWTH 3 DAYS Performed at [facility], [address], [place], [place] [address]    Report Status PENDING  Incomplete  Surgical PCR screen     Status: Abnormal   Collection Time: 12/10/23  9:13 PM   Specimen: Nasal Mucosa; Nasal Swab  Result Value Ref Range Status   MRSA, PCR NEGATIVE NEGATIVE Final   Staphylococcus aureus POSITIVE (A) NEGATIVE Final    Comment: (NOTE) The Xpert SA Assay (FDA approved for NASAL specimens in patients 46 years of age and older), is one component of a comprehensive surveillance program. It is not intended to diagnose infection nor to guide or monitor treatment. Performed at [facility], [address], [place], [place] [address]   Aerobic/Anaerobic Culture w Gram Stain (surgical/deep wound)     Status: None (Preliminary result)   Collection Time:  12/11/23 11:17 AM   Specimen: Foot, Left; Tissue  Result Value Ref Range Status   Specimen Description   Final    WOUND Performed at [facility], [address] [place], [place] [address]    Special Requests   Final    LEFT,FOOT Performed at [facility], [address] [place], [place] [address]    Gram Stain   Final    NO WBC SEEN NO ORGANISMS SEEN Performed at [facility], [address], [place], [place] [address]    Culture PENDING  Incomplete   Report Status PENDING  Incomplete    Labs: CBC: Recent Labs  Lab 12/09/23 1136 12/10/23 0352 12/12/23 0357  WBC 5.8 6.3 7.9  NEUTROABS 4.3  --   --   HGB 10.0* 8.7* 9.5*  HCT 32.7* 28.3* 30.6*  MCV 93.7 93.1 93.3  PLT 217 193 212   Basic Metabolic Panel: Recent Labs  Lab 12/09/23 1136 12/10/23 0352 12/12/23 0357  NA 139 142 142  K 4.3 3.9 4.0  CL 105 111 110  CO2 22 22 20*  GLUCOSE 93 73 82  BUN 57* 57* 59*  CREATININE 5.91* 5.84* 5.65*  CALCIUM  8.3* 7.9* 8.5*   Liver Function Tests: Recent Labs  Lab 12/09/23 1136  AST 16  ALT 7  ALKPHOS 70  BILITOT 0.6  PROT 6.2*  ALBUMIN 3.1*   CBG: No results for input(s): GLUCAP in the last 168 hours.  Discharge time spent: greater than 30 minutes.  This record has been created using Conservation officer, historic buildings. Errors have been sought and corrected,but may not always be located. Such creation errors do not reflect on the standard of care.   Signed: Amaryllis Dare, MD [facility] 12/12/2023

## 2023-12-12 NOTE — Discharge Instructions (Signed)

## 2023-12-12 NOTE — Progress Notes (Signed)
 Daily Progress Note   Subjective  - 1 Day Post-Op  Follow-up left foot transmetatarsal amputation.  No complaints.  Objective Vitals:   12/11/23 1515 12/11/23 2142 12/12/23 0346 12/12/23 0823  BP: (!) 165/88 137/79 137/81 (!) 143/71  Pulse: 85 60 64 79  Resp: 18 17 18 16   Temp: 97.6 F (36.4 C) 98.6 F (37 C) 98.7 F (37.1 C) 98.5 F (36.9 C)  TempSrc:    Oral  SpO2: 100% 96% 99% 100%  Weight:      Height:        Physical Exam: Incisions well coapted.  Good perfusion of the skin flap both dorsal and plantar.  Minimal drainage on the bandage.       Laboratory CBC    Component Value Date/Time   WBC 7.9 12/12/2023 0357   HGB 9.5 (L) 12/12/2023 0357   HGB 9.9 (L) 11/25/2023 1103   HCT 30.6 (L) 12/12/2023 0357   PLT 212 12/12/2023 0357   PLT 222 11/25/2023 1103    BMET    Component Value Date/Time   NA 142 12/12/2023 0357   K 4.0 12/12/2023 0357   CL 110 12/12/2023 0357   CO2 20 (L) 12/12/2023 0357   GLUCOSE 82 12/12/2023 0357   BUN 59 (H) 12/12/2023 0357   CREATININE 5.65 (H) 12/12/2023 0357   CALCIUM  8.5 (L) 12/12/2023 0357   GFRNONAA 9 (L) 12/12/2023 0357   GFRAA 47 (L) 10/01/2019 0416    Assessment/Planning: Osteomyelitis status post transmetatarsal amputation left foot  Patient has worked with physical therapy.  He continues to maintain elevation.  He can weight-bear on his heel only for necessities such as transfer or bathroom privileges.  Otherwise should remain off the foot. I suspect amputation was therapeutic.  Can continue with p.o. antibiotics upon discharge for 5 days. From podiatry standpoint patient stable for discharge.  Dressing changed today.  Can leave dressing clean dry and intact for the next week.  Should follow-up with me in 1 week in the outpatient clinic.  Ashley Soulier A  12/12/2023, 8:44 AM

## 2023-12-12 NOTE — Care Management Important Message (Signed)
 Important Message  Patient Details  Name: Michael Holloway MRN: [id number] Date of Birth: [birth year]   Important Message Given:  Yes - Medicare IM     Rojelio [name] 12/12/2023, 1:37 PM

## 2023-12-12 NOTE — Evaluation (Signed)
 Physical Therapy Evaluation Patient Details Name: Michael Holloway MRN: [id number] DOB: 1928/06/20 Today's Date: 12/12/2023  History of Present Illness  Pt is a 89 yo male s/p transmetatarsal amputation (Left) per podiatry partial weight bearing with heel contact in post op shoe for short distances. PMH of bladder cancer, CKD, GERD, HTN, pacemaker, CHF, neuropathy, gout.  Clinical Impression  Pt received in Semi-Fowler's position and agreeable to therapy.  Pt reports he is wanting to go home today and hopes that therapy goes well so that he can do so.  Pt is taller individual and requires elevated surface to rise into standing position, however pt does not need any other physical assistance.  Once upright, pt does feel a little uncomfortable due to prior falls being posteriorly and with keeping the weight on the heel, pt feels as though he is going to fall backwards.  Pt ultimately performed well with ambulation and CGA.  Pt felt he was much more stable with having therapist behind him using the gait belt and would like for his son to use one when at home.  Pt given gait belt and instructed on how to utilize at home.  Pt returned to the bed and was left with all needs met, all questions answered, and call bell within reach.          If plan is discharge home, recommend the following: A little help with walking and/or transfers;A little help with bathing/dressing/bathroom;Help with stairs or ramp for entrance;Assist for transportation   Can travel by private vehicle        Equipment Recommendations None recommended by PT  Recommendations for Other Services       Functional Status Assessment Patient has had a recent decline in their functional status and demonstrates the ability to make significant improvements in function in a reasonable and predictable amount of time.     Precautions / Restrictions        Mobility  Bed Mobility Overal bed mobility: Modified Independent              General bed mobility comments: Pt with a little unsteadiness and needing elevaed bed due to height of pt.    Transfers Overall transfer level: Modified independent Equipment used: Rolling walker (2 wheels)               General transfer comment: extra time needed    Ambulation/Gait Ambulation/Gait assistance: Contact guard assist Gait Distance (Feet): 20 Feet Assistive device: Rolling walker (2 wheels) Gait Pattern/deviations: Step-to pattern Gait velocity: decreased     General Gait Details: Practiced performing ambulation to the distance he will be ambulating to the bathroom at home and then back.  Utilized Ue support to offload the L foot and only place weight through the heel per protocol.  Stairs            Wheelchair Mobility     Tilt Bed    Modified Rankin (Stroke Patients Only)       Balance Overall balance assessment: Needs assistance Sitting-balance support: No upper extremity supported, Feet supported Sitting balance-Leahy Scale: Good     Standing balance support: Bilateral upper extremity supported, During functional activity, Reliant on assistive device for balance Standing balance-Leahy Scale: Fair Standing balance comment: Slight posterior bias.                             Pertinent Vitals/Pain Pain Assessment Pain Assessment: No/denies pain  Home Living Family/patient expects to be discharged to:: Private residence Living Arrangements: Children Available Help at Discharge: Family Type of Home: House Home Access: Stairs to enter   [employer] of Steps: 1 step   Home Layout: Two level;Able to live on main level with bedroom/bathroom Home Equipment: Rolling Walker (2 wheels);Cane - single point      Prior Function Prior Level of Function : Independent/Modified Independent               ADLs Comments: independent     Extremity/Trunk Assessment   Upper Extremity Assessment Upper Extremity  Assessment: Generalized weakness    Lower Extremity Assessment Lower Extremity Assessment: Generalized weakness       Communication   Communication Communication: No apparent difficulties    Cognition Arousal: Alert Behavior During Therapy: WFL for tasks assessed/performed   PT - Cognitive impairments: No apparent impairments                                 Cueing       General Comments      Exercises     Assessment/Plan    PT Assessment Patient needs continued PT services  PT Problem List Decreased strength;Decreased activity tolerance;Decreased balance;Decreased mobility;Decreased knowledge of use of DME;Decreased safety awareness       PT Treatment Interventions DME instruction;Gait training;Functional mobility training;Therapeutic activities;Stair training;Therapeutic exercise;Balance training;Neuromuscular re-education    PT Goals (Current goals can be found in the Care Plan section)  Acute Rehab PT Goals Patient Stated Goal: to return home today. PT Goal Formulation: With patient Time For Goal Achievement: 12/26/23 Potential to Achieve Goals: Good    Frequency       Co-evaluation               AM-PAC PT 6 Clicks Mobility  Outcome Measure Help needed turning from your back to your side while in a flat bed without using bedrails?: None Help needed moving from lying on your back to sitting on the side of a flat bed without using bedrails?: None Help needed moving to and from a bed to a chair (including a wheelchair)?: None Help needed standing up from a chair using your arms (e.g., wheelchair or bedside chair)?: A Little Help needed to walk in hospital room?: A Little Help needed climbing 3-5 steps with a railing? : A Lot 6 Click Score: 20    End of Session Equipment Utilized During Treatment: Gait belt Activity Tolerance: Patient tolerated treatment well Patient left: in bed;with call bell/phone within reach Nurse Communication:  Mobility status PT Visit Diagnosis: Unsteadiness on feet (R26.81);Other abnormalities of gait and mobility (R26.89);Muscle weakness (generalized) (M62.81)    Time: 9046-8990 PT Time Calculation (min) (ACUTE ONLY): 16 min   Charges:   PT Evaluation $PT Eval Low Complexity: 1 Low   PT General Charges $$ ACUTE PT VISIT: 1 Visit         Fonda Simpers, PT, DPT Physical Therapist - [facility]  12/12/23, 1:01 PM

## 2023-12-12 NOTE — Progress Notes (Signed)
 [facility]  ROUNDING NOTE   Subjective:  Patient seen and evaluated at bedside.  Family with him.  No acute complaints.  Denies shortness of breath.  Patient reports no bowel movement since Wednesday but feels that it is due to hospitalization.  Patient feels this will improve once he is home and back to his normal eating habits.    Objective:  Vital signs in last 24 hours:  Temp:  [97.6 F (36.4 C)-98.7 F (37.1 C)] 98.5 F (36.9 C) (08/02 0823) Pulse Rate:  [60-85] 79 (08/02 0823) Resp:  [16-18] 16 (08/02 0823) BP: (137-165)/(71-88) 143/71 (08/02 0823) SpO2:  [96 %-100 %] 100 % (08/02 0823)  Weight change:  Filed Weights   12/09/23 1134 12/11/23 0851  Weight: 81.6 kg 81.6 kg    Intake/Output: I/O last 3 completed shifts: In: 223.3 [I.V.:40; IV Piggyback:183.3] Out: 1425 [Urine:1425]   Intake/Output this shift:  Total I/O In: 240 [P.O.:240] Out: -   Physical Exam: General: NAD  Head: Normocephalic  Eyes: Anicteric  Neck: Supple  Lungs:  Clear to auscultation  Heart: Regular rate and rhythm  Abdomen:  Soft, nontender  Extremities: No peripheral edema.  Neurologic: Alert  Skin: No lesions  Access: PD Tenckhoff    Basic Metabolic Panel: Recent Labs  Lab 12/09/23 1136 12/10/23 0352 12/12/23 0357  NA 139 142 142  K 4.3 3.9 4.0  CL 105 111 110  CO2 22 22 20*  GLUCOSE 93 73 82  BUN 57* 57* 59*  CREATININE 5.91* 5.84* 5.65*  CALCIUM  8.3* 7.9* 8.5*    Liver Function Tests: Recent Labs  Lab 12/09/23 1136  AST 16  ALT 7  ALKPHOS 70  BILITOT 0.6  PROT 6.2*  ALBUMIN 3.1*   No results for input(s): LIPASE, AMYLASE in the last 168 hours. No results for input(s): AMMONIA in the last 168 hours.  CBC: Recent Labs  Lab 12/09/23 1136 12/10/23 0352 12/12/23 0357  WBC 5.8 6.3 7.9  NEUTROABS 4.3  --   --   HGB 10.0* 8.7* 9.5*  HCT 32.7* 28.3* 30.6*  MCV 93.7 93.1 93.3  PLT 217 193 212    Cardiac Enzymes: No results for  input(s): CKTOTAL, CKMB, CKMBINDEX, TROPONINI in the last 168 hours.  BNP: Invalid input(s): POCBNP  CBG: No results for input(s): GLUCAP in the last 168 hours.  Microbiology: Results for orders placed or performed during the hospital encounter of 12/09/23  Blood Culture (routine x 2)     Status: None (Preliminary result)   Collection Time: 12/09/23 12:53 PM   Specimen: BLOOD  Result Value Ref Range Status   Specimen Description BLOOD RIGHT ANTECUBITAL  Final   Special Requests   Final    BOTTLES DRAWN AEROBIC AND ANAEROBIC Blood Culture adequate volume   Culture   Final    NO GROWTH 3 DAYS Performed at [facility], [address], [place], [place] [address]    Report Status PENDING  Incomplete  Blood Culture (routine x 2)     Status: None (Preliminary result)   Collection Time: 12/09/23 12:53 PM   Specimen: BLOOD  Result Value Ref Range Status   Specimen Description BLOOD BLOOD RIGHT FOREARM  Final   Special Requests   Final    BOTTLES DRAWN AEROBIC AND ANAEROBIC Blood Culture adequate volume   Culture   Final    NO GROWTH 3 DAYS Performed at [facility], [address] [place], [place] [address]    Report Status PENDING  Incomplete  Surgical PCR screen     Status: Abnormal   Collection Time: 12/10/23  9:13 PM   Specimen: Nasal Mucosa; Nasal Swab  Result Value Ref Range Status   MRSA, PCR NEGATIVE NEGATIVE Final   Staphylococcus aureus POSITIVE (A) NEGATIVE Final    Comment: (NOTE) The Xpert SA Assay (FDA approved for NASAL specimens in patients 26 years of age and older), is one component of a comprehensive surveillance program. It is not intended to diagnose infection nor to guide or monitor treatment. Performed at [facility], [address], [place], [place] [address]   Aerobic/Anaerobic Culture w Gram Stain (surgical/deep wound)     Status: None (Preliminary result)   Collection Time: 12/11/23 11:17 AM   Specimen:  Foot, Left; Tissue  Result Value Ref Range Status   Specimen Description   Final    WOUND Performed at [facility], [address] [place], [place] [address]    Special Requests   Final    LEFT,FOOT Performed at [facility], [address] [place], [place] [address]    Gram Stain NO WBC SEEN NO ORGANISMS SEEN   Final   Culture   Final    NO GROWTH < 24 HOURS Performed at [facility], [address], [place], [place] [address]    Report Status PENDING  Incomplete    Coagulation Studies: No results for input(s): LABPROT, INR in the last 72 hours.  Urinalysis: No results for input(s): COLORURINE, LABSPEC, PHURINE, GLUCOSEU, HGBUR, BILIRUBINUR, KETONESUR, PROTEINUR, UROBILINOGEN, NITRITE, LEUKOCYTESUR in the last 72 hours.  Invalid input(s): APPERANCEUR    Imaging: DG MINI C-ARM IMAGE ONLY Result Date: 12/11/2023 There is no interpretation for this exam.  This order is for images obtained during a surgical procedure.  Please See Surgeries Tab for more information regarding the procedure.     Medications:    ceFEPime  (MAXIPIME ) IV 1 g (12/11/23 1630)    aspirin   81 mg Oral Daily   calcitRIOL   0.25 mcg Oral Daily   gabapentin   300 mg Oral TID   heparin   5,000 Units Subcutaneous Q8H   latanoprost   1 drop Right Eye QHS   multivitamin with minerals  1 tablet Oral Daily   mupirocin  ointment  1 Application Nasal BID   sodium bicarbonate   650 mg Oral TID   vancomycin  variable dose per unstable renal function (pharmacist dosing)   Does not apply See admin instructions   acetaminophen  **OR** acetaminophen , heparin  lock flush, heparin  lock flush, heparin  lock flush, heparin  lock flush, ondansetron  **OR** ondansetron  (ZOFRAN ) IV, senna-docusate, sodium chloride  flush, sodium chloride  flush, sodium chloride  flush, sodium chloride  flush  Assessment/ Plan:  Michael Holloway is a 89 y.o.  male with past medical conditions  including PVD, bladder cancer, anemia, sick sinus syndrome with PPM, and ESRD on peritoneal dialysis.  Patient presents to the emergency department for evaluation of left foot wound and has been admitted for Chronic ulcer of great toe of left foot, unspecified ulcer stage (HCC) [L97.529] Ulcer of left foot with muscle involvement without evidence of necrosis (HCC) [L97.525]     End-stage renal disease on peritoneal dialysis.  Began peritoneal dialysis training on Monday.  Patient remained stable without respiratory distress or electrolyte imbalances.  Will continue to monitor labs and patient presentation.  Will hold peritoneal dialysis for now.  Patient will continue PD training post discharge.   2.  Left foot great toe ulcer, questionable osteomyelitis.  MRI confirms  osteomyelitis/septic arthritis.  Broad-spectrum antibiotics ordered by primary team.  Left forefoot TMA performed on 8/1.   3. Anemia of chronic kidney disease Recent Labs       Lab Results  Component Value Date    HGB 9.5(L) 12/12/2023      Hemoglobin improved.   4. Secondary Hyperparathyroidism: with outpatient labs: None available Recent Labs       Lab Results  Component Value Date    CALCIUM  8.5 (L) 12/12/2023      Calcium  improved.  LOS: 3 Merik Mignano SHAUNNA Dines 8/2/20251:56 PM

## 2023-12-12 NOTE — TOC Initial Note (Signed)
 Transition of Care [facility]) - Initial/Assessment Note    Patient Details  Name: Michael Holloway MRN: [id number] Date of Birth: March 15, 1929  Transition of Care [facility]) CM/SW Contact:    [place] L Zacari Radick, LCSW Phone Number: 12/12/2023, 12:55 PM  Clinical Narrative:                  CSW spoke with patient's son, Michael Holloway. Choice for [facility] agency was offered. Mr. Tierno advised of no preference. He was agreeable to CSW locating an agency based on insurance network.   CSW contacted [facility] and spoke with [place]. Home Health has been setup for PT, OT and RN.   Start of service to begin Monday.        Patient Goals and CMS Choice            Expected Discharge Plan and Services         Expected Discharge Date: 12/12/23                                    Prior Living Arrangements/Services                       Activities of Daily Living   ADL Screening (condition at time of admission) Independently performs ADLs?: Yes (appropriate for developmental age) Is the patient deaf or have difficulty hearing?: No Does the patient have difficulty seeing, even when wearing glasses/contacts?: No Does the patient have difficulty concentrating, remembering, or making decisions?: No  Permission Sought/Granted                  Emotional Assessment              Admission diagnosis:  Chronic ulcer of great toe of left foot, unspecified ulcer stage (HCC) [L97.529] Ulcer of left foot with muscle involvement without evidence of necrosis (HCC) [L97.525] Patient Active Problem List   Diagnosis Date Noted   ESRD (end stage renal disease) (HCC) 12/10/2023   Peripheral neuropathy 12/10/2023   Chronic ulcer of great toe of left foot, unspecified ulcer stage (HCC) 12/09/2023   Other idiopathic peripheral autonomic neuropathy 12/08/2023   Constipation 12/02/2023   Hyp chr kidney disease w stage 5 chr kidney disease or ESRD (HCC) 11/17/2023   Atherosclerosis of native  arteries of the extremities with ulceration (HCC) 10/23/2023   Anemia in chronic kidney disease (CKD) 10/14/2023   Symptomatic anemia 09/01/2023   Acute osteomyelitis of toe of left foot (HCC) 03/27/2023   Osteomyelitis of left foot (HCC) 03/26/2023   Chronic combined systolic and diastolic CHF (congestive heart failure) (HCC) 03/26/2023   Paroxysmal atrial fibrillation (HCC) 04/17/2021   Hypotension 05/24/2020   Goals of care, counseling/discussion 05/03/2020   Encounter for antineoplastic chemotherapy 05/03/2020   Malignant neoplasm of overlapping sites of bladder (HCC) 04/17/2020   Bladder cancer (HCC) 04/17/2020   Status post total knee replacement using cement, right 09/29/2019   Chondrocalcinosis 08/29/2019   Primary osteoarthritis of right knee 08/29/2019   Healthcare maintenance 06/28/2019   Ambulates with cane 12/23/2018   Elevated hemoglobin A1c 12/23/2018   Spinal stenosis of lumbar region with neurogenic claudication 12/23/2018   S/P placement of cardiac pacemaker 08/11/2018   Sinus pause 07/07/2018   Sick sinus syndrome (HCC) 07/07/2018   Hyperlipidemia 06/10/2017   Status post placement of implantable loop recorder 06/10/2017   Bladder tumor 03/31/2017   Carotid stenosis 01/07/2017  Gross hematuria 07/23/2016   Lumbar radiculitis 06/10/2016   Near syncope 04/24/2016   Bigeminal rhythm 04/24/2016   Paresthesia of bilateral legs 03/07/2016   Chronic lumbar radiculopathy 03/07/2016   Hypertension 09/03/2015   Age-related macular degeneration, dry, right eye 06/07/2015   Exudative age-related macular degeneration, left eye, with active choroidal neovascularization (HCC) 04/02/2015   Degenerative disc disease, cervical 03/03/2015   Stage 3b chronic kidney disease (HCC) 09/28/2014   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   Other allergic rhinitis 09/28/2014   History of recent fall 09/28/2014   Gastroesophageal reflux disease without esophagitis 09/28/2014   Chronic neck pain  09/28/2014   Benign essential hypertension 09/28/2014   PCP:  Pcp, No Pharmacy:   [facility] [id number] [address],  - [address] [place] [address] Phone: [phone] Fax: [phone]     Social Drivers of Health (SDOH) Social History: SDOH Screenings   Food Insecurity: No Food Insecurity (12/09/2023)  Housing: Unknown (12/09/2023)  Transportation Needs: No Transportation Needs (12/09/2023)  Utilities: Not At Risk (12/09/2023)  Depression (PHQ2-9): Low Risk  (12/02/2023)  Financial Resource Strain: Patient Declined (11/30/2023)   [facility]  Social Connections: Moderately Integrated (12/09/2023)  Stress: No Stress Concern Present (11/16/2023)   [facility] from [facility]  Tobacco Use: Medium Risk (12/09/2023)   SDOH Interventions: Housing Interventions: Patient Declined Transportation Interventions: Patient Declined Utilities Interventions: Patient Declined Social Connections Interventions: Patient Declined   Readmission Risk Interventions     No data to display

## 2023-12-14 ENCOUNTER — Encounter: Payer: Self-pay | Admitting: Podiatry

## 2023-12-15 LAB — SURGICAL PATHOLOGY

## 2023-12-15 NOTE — Anesthesia Preprocedure Evaluation (Signed)
 Anesthesia Evaluation  Patient identified by MRN, date of birth, ID band Patient awake    Reviewed: Allergy & Precautions, H&P , NPO status , Patient's Chart, lab work & pertinent test results, reviewed documented beta blocker date and time   Airway Mallampati: II   Neck ROM: full    Dental  (+) Poor Dentition   Pulmonary neg pulmonary ROS, former smoker   Pulmonary exam normal        Cardiovascular Exercise Tolerance: Poor hypertension, + Peripheral Vascular Disease and +CHF  Normal cardiovascular exam+ dysrhythmias + pacemaker  Rhythm:regular Rate:Normal     Neuro/Psych  Neuromuscular disease  negative psych ROS   GI/Hepatic Neg liver ROS,GERD  Medicated,,  Endo/Other  negative endocrine ROS    Renal/GU CRFRenal disease  negative genitourinary   Musculoskeletal   Abdominal   Peds  Hematology  (+) Blood dyscrasia, anemia   Anesthesia Other Findings Past Medical History: No date: Allergy No date: Arthritis No date: Bladder cancer (HCC) No date: Cancer (HCC)     Comment:  Basal Cell Skin Cancer No date: Cataract No date: Chronic kidney disease     Comment:  Stage 3 CKD No date: Dysrhythmia No date: GERD (gastroesophageal reflux disease) No date: Gout No date: History of kidney stones No date: Hypertension 05/24/2020: Hypotension No date: Neuropathy 2020: Presence of permanent cardiac pacemaker No date: Spinal stenosis Past Surgical History: 12/11/2023: ACHILLES TENDON LENGTHENING; Left     Comment:  Procedure: LENGTHENING, TENDON, ACHILLES;  Surgeon:               Ashley Soulier, DPM;  Location: [facility];  Service:               Orthopedics/Podiatry;  Laterality: Left; 03/28/2023: AMPUTATION; Left     Comment:  Procedure: PARTIAL 5TH RAY AMPUTATION;  Surgeon: Lennie Barter, DPM;  Location: [facility];  Service:               Orthopedics/Podiatry;  Laterality: Left; No date: BACK SURGERY No  date: CERVICAL SPINE SURGERY     Comment:   X 2 No date: CHOLECYSTECTOMY 08/25/2016: CYSTOSCOPY W/ RETROGRADES; Bilateral     Comment:  Procedure: CYSTOSCOPY WITH RETROGRADE PYELOGRAM;                Surgeon: Rosina Riis, MD;  Location: [facility];                Service: Urology;  Laterality: Bilateral; 07/18/2019: CYSTOSCOPY W/ RETROGRADES; Bilateral     Comment:  Procedure: CYSTOSCOPY WITH RETROGRADE PYELOGRAM;                Surgeon: Riis Rosina, MD;  Location: [facility];                Service: Urology;  Laterality: Bilateral; 03/26/2020: CYSTOSCOPY W/ RETROGRADES; Bilateral     Comment:  Procedure: CYSTOSCOPY WITH RETROGRADE PYELOGRAM;                Surgeon: Riis Rosina, MD;  Location: [facility];                Service: Urology;  Laterality: Bilateral; No date: EYE SURGERY; Bilateral     Comment:  Cataract Extraction with IOL No date: JOINT REPLACEMENT; Bilateral     Comment:  total knee replacements 06/03/2017: LOOP RECORDER INSERTION; N/A     Comment:  Procedure: LOOP RECORDER INSERTION;  Surgeon:  Ammon Blunt, MD;  Location: [facility];  Service:              Cardiovascular;  Laterality: N/A; 03/27/2023: LOWER EXTREMITY ANGIOGRAPHY; Left     Comment:  Procedure: Lower Extremity Angiography;  Surgeon: Marea Selinda RAMAN, MD;  Location: [facility];  Service:               Cardiovascular;  Laterality: Left; No date: LUMBAR LAMINECTOMY     Comment:   X 4 08/03/2018: PACEMAKER INSERTION; Left     Comment:  Procedure: INSERTION PACEMAKER DUALCHAMBER;  Surgeon:               Ammon Blunt, MD;  Location: [facility];  Service:               Cardiovascular;  Laterality: Left; 1990s: PROSTATE SURGERY     Comment:  Dr. Gala ,in office procedure No date: TONSILLECTOMY 09/29/2019: TOTAL KNEE ARTHROPLASTY; Right     Comment:  Procedure: TOTAL KNEE ARTHROPLASTY;  Surgeon: Edie Norleen PARAS, MD;  Location: [facility];   Service: Orthopedics;                Laterality: Right; 12/11/2023: TRANSMETATARSAL AMPUTATION; Left     Comment:  Procedure: AMPUTATION, FOOT, TRANSMETATARSAL;  Surgeon:               Ashley Soulier, DPM;  Location: [facility];  Service:               Orthopedics/Podiatry;  Laterality: Left;  1st Ray AMP 08/25/2016: TRANSURETHRAL RESECTION OF BLADDER TUMOR WITH MITOMYCIN -C;  N/A     Comment:  Procedure: TRANSURETHRAL RESECTION OF BLADDER TUMOR WITH              MITOMYCIN -C;  Surgeon: Rosina Riis, MD;  Location:               [facility];  Service: Urology;  Laterality: N/A; 07/18/2019: TRANSURETHRAL RESECTION OF BLADDER TUMOR WITH MITOMYCIN -C;  N/A     Comment:  Procedure: TRANSURETHRAL RESECTION OF BLADDER TUMOR WITH              gemcitabine ;  Surgeon: Riis Rosina, MD;  Location:               [facility];  Service: Urology;  Laterality: N/A; 03/26/2020: TRANSURETHRAL RESECTION OF BLADDER TUMOR WITH MITOMYCIN - C; N/A     Comment:  Procedure: TRANSURETHRAL RESECTION OF BLADDER TUMOR WITH              gemcitabine ;  Surgeon: Riis Rosina, MD;  Location:               [facility];  Service: Urology;  Laterality: N/A; 06/14/2018: TRANSURETHRAL RESECTION OF PROSTATE; N/A     Comment:  Procedure: TRANSURETHRAL RESECTION OF THE PROSTATE               (TURP) with Gemcitabine ;  Surgeon: Riis Rosina, MD;                Location: [facility];  Service: Urology;  Laterality: N/A; BMI    Body Mass Index: 21.91 kg/m     Reproductive/Obstetrics negative OB ROS  Anesthesia Physical Anesthesia Plan  ASA: 3  Anesthesia Plan: General   Post-op Pain Management:    Induction:   PONV Risk Score and Plan:   Airway Management Planned:   Additional Equipment:   Intra-op Plan:   Post-operative Plan:   Informed Consent: I have reviewed the patients History and Physical, chart, labs and discussed the procedure including the risks, benefits and  alternatives for the proposed anesthesia with the patient or authorized representative who has indicated his/her understanding and acceptance.     Dental Advisory Given  Plan Discussed with: CRNA  Anesthesia Plan Comments:         Anesthesia Quick Evaluation

## 2023-12-15 NOTE — Anesthesia Postprocedure Evaluation (Signed)
 Anesthesia Post Note  Patient: Michael Holloway  Procedure(s) Performed: AMPUTATION, FOOT, TRANSMETATARSAL (Left: Foot) LENGTHENING, TENDON, ACHILLES (Left)  Patient location during evaluation: PACU Anesthesia Type: General Level of consciousness: awake and alert Pain management: pain level controlled Vital Signs Assessment: post-procedure vital signs reviewed and stable Respiratory status: spontaneous breathing, nonlabored ventilation, respiratory function stable and patient connected to nasal cannula oxygen Cardiovascular status: blood pressure returned to baseline and stable Postop Assessment: no apparent nausea or vomiting Anesthetic complications: no   No notable events documented.   Last Vitals:  Vitals:   12/12/23 0346 12/12/23 0823  BP: 137/81 (!) 143/71  Pulse: 64 79  Resp: 18 16  Temp: 37.1 C 36.9 C  SpO2: 99% 100%    Last Pain:  Vitals:   12/12/23 0823  TempSrc: Oral  PainSc: 0-No pain                 [place] KANDICE Clause

## 2023-12-16 ENCOUNTER — Inpatient Hospital Stay: Attending: Oncology

## 2023-12-16 VITALS — BP 123/68 | HR 64 | Temp 97.4°F | Resp 18

## 2023-12-16 DIAGNOSIS — D631 Anemia in chronic kidney disease: Secondary | ICD-10-CM | POA: Insufficient documentation

## 2023-12-16 DIAGNOSIS — N185 Chronic kidney disease, stage 5: Secondary | ICD-10-CM | POA: Diagnosis present

## 2023-12-16 LAB — AEROBIC/ANAEROBIC CULTURE W GRAM STAIN (SURGICAL/DEEP WOUND)
Culture: NO GROWTH
Gram Stain: NONE SEEN

## 2023-12-16 MED ORDER — IRON SUCROSE 20 MG/ML IV SOLN
200.0000 mg | Freq: Once | INTRAVENOUS | Status: AC
  Administered 2023-12-16: 200 mg via INTRAVENOUS
  Filled 2023-12-16: qty 10

## 2023-12-16 NOTE — Patient Instructions (Signed)

## 2023-12-17 LAB — CULTURE, BLOOD (ROUTINE X 2)
Culture: NO GROWTH
Culture: NO GROWTH
Special Requests: ADEQUATE
Special Requests: ADEQUATE

## 2023-12-29 NOTE — Progress Notes (Signed)
 Chief Complaint:   No chief complaint on file.   Subjective   HPI  Michael Holloway is a 89 y.o. male who presents for No chief complaint on file. History of Present Illness Michael Holloway is a 89 year old male who presents for stitch removal and wound care follow-up.  He has been wearing a boot to protect the area and has minimized weight-bearing on the affected foot, particularly keeping weight on the heel.  He has developed new sores on the right foot, particularly on the outside of the ankle, which he attributes to irritation from the boot and possibly hitting it with a sandal.  He is able to shower and let water run over the area, but he avoids submerging it in water. After showering, he pats the area dry and wears a heavy sock before putting on his shoe.  Review of Systems  Patient Active Problem List  Diagnosis  . Polyarticular gout  . Gastroesophageal reflux disease without esophagitis  . Chronic neck pain  . CKD (chronic kidney disease) stage 3, GFR 30-59 ml/min (CMS-HCC)  . History of recent fall  . Degenerative disc disease, cervical  . Exudative age-related macular degeneration, left eye, with active choroidal neovascularization (CMS/HHS-HCC)  . Age-related macular degeneration, dry, right eye  . Essential hypertension  . Chronic lumbar radiculopathy  . Paresthesia of bilateral legs  . Bigeminal rhythm  . Lumbar radiculitis  . Chronic insomnia  . Hyperlipidemia  . Status post placement of implantable loop recorder  . Sinus pause  . Sick sinus syndrome (CMS/HHS-HCC)  . S/P placement of cardiac pacemaker  . Ambulates with cane  . Spinal stenosis of lumbar region with neurogenic claudication  . Elevated hemoglobin A1c  . Healthcare maintenance  . Primary osteoarthritis of right knee  . Chondrocalcinosis  . Status post total knee replacement using cement, right  . Positional lightheadedness  . Generalized weakness  . Paroxysmal atrial fibrillation (CMS/HHS-HCC)  .  Mild left ventricular systolic dysfunction    Outpatient Medications Prior to Visit  Medication Sig Dispense Refill  . aspirin  81 MG EC tablet Take 81 mg by mouth once daily    . BIOTIN  ORAL Take 1 capsule by mouth once daily       . calcitRIOL  (ROCALTROL ) 0.25 MCG capsule Take 0.25 mcg by mouth once daily    . cholecalciferol, vitamin D3, (VITAMIN D3) 10 mcg (400 unit) Cap Take by mouth    . collagenase (SANTYL) ointment Apply to affected area daily 30 g 2  . diphenhydrAMINE  (BENADRYL ) 50 MG capsule Take 50 mg by mouth    . diphenhydrAMINE -acetaminophen  (TYLENOL  PM) 25-500 mg per tablet Take 1 tablet by mouth    . doxycycline  (VIBRA -TABS) 100 MG tablet     . gabapentin  (NEURONTIN ) 600 MG tablet TAKE 1 TABLET BY MOUTH 3 TIMES A DAY, AND TAKE 1/2 A TABLET EXTRA AS NEEDED 315 tablet 3  . gentamicin  0.1 % cream Apply 1 Application topically    . HYDROcodone -acetaminophen  (NORCO) 5-325 mg tablet Take 1 tablet by mouth    . latanoprost  (XALATAN ) 0.005 % ophthalmic solution Place 1 drop into the right eye at bedtime 2.5 mL 9  . methocarbamoL (ROBAXIN) 500 MG tablet Take 500 mg by mouth 3 (three) times daily    . omeprazole  (PRILOSEC) 20 MG DR capsule Take 1 capsule by mouth    . ondansetron  (ZOFRAN ) 4 MG tablet Take 4 mg by mouth every 6 (six) hours as needed for Nausea    .  sodium bicarbonate  650 MG tablet Take 650 mg by mouth    . traMADoL  (ULTRAM ) 50 mg tablet Take 1 tablet (50 mg total) by mouth every 6 (six) hours as needed for Pain 30 tablet 0  . vit C/E/Zn/coppr/lutein /zeaxan (PRESERVISION AREDS-2 ORAL) Take 1 capsule by mouth 2 (two) times daily     No facility-administered medications prior to visit.      Objective   There were no vitals filed for this visit. There is no height or weight on file to calculate BMI.  Home Vitals:     Physical Exam EXTREMITIES: Small sores on the outside of the ankle. Multiple small wounds on the foot with fragile skin. Irritation on the foot,  likely from boot pressure.  The transmetatarsal amputation site on the left foot is completely healed.  We were able to remove the sutures today.  Unfortunately he developed a new small pressure ulcer on the lateral aspect of the right ankle.  It is small in nature and very superficial.  Just a scant amount of fibrotic tissue.  No signs of active infection.     The following labs were personally reviewed: - Lab Results  Component Value Date   WBC 7.0 03/10/2023   HGB 13.7 (L) 03/10/2023   HCT 41.3 03/10/2023   PLT 305 03/10/2023   - Lab Results  Component Value Date   NA 141 07/13/2023   K 4.4 07/13/2023   CL 110 (H) 07/13/2023   CO2 21.3 (L) 07/13/2023   BUN 56 (H) 07/13/2023   CREATININE 3.3 (H) 07/13/2023   GLUCOSE 95 07/13/2023   - Lab Results  Component Value Date   NA 141 07/13/2023   K 4.4 07/13/2023   CL 110 (H) 07/13/2023   CO2 21.3 (L) 07/13/2023   BUN 56 (H) 07/13/2023   CREATININE 3.3 (H) 07/13/2023   CALCIUM  8.6 (L) 07/13/2023   ALB 4.1 07/06/2023   TBILI 0.5 07/06/2023   ALKPHOS 99 07/06/2023   AST 17 07/06/2023   ALT 8 07/06/2023   GLUCOSE 95 07/13/2023   GFR 17 (L) 07/13/2023   - Lab Results  Component Value Date   HGBA1C 5.5 01/10/2022      Results Procedure: Suture removal Description: Sutures were removed from the wound. Minor bleeding was observed from the suture sites. A light bandage was applied to the area.     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  Status post transmetatarsal amputation The left foot ulcer is healing well post-amputation. The skin is fragile, and any pressure can cause tearing. - Remove stitches from the left foot. - Advise wearing a heavy sock and a light bandage after showering. - Instruct to minimize weight-bearing on the left foot. - Schedule follow-up appointment in two weeks to monitor healing.  Multiple right foot skin wounds Multiple small  wounds on the right foot, likely due to pressure or irritation from footwear. The skin is very thin and fragile, making it susceptible to tearing with pressure. - Apply a light bandage to the right foot wounds. - Schedule follow-up appointment in two weeks to reassess the right foot wounds.  Diagnoses and all orders for this visit:  Ulcer of left foot with fat layer exposed (CMS/HHS-HCC)  PVD (peripheral vascular disease) ()  Skin ulcer of right ankle, limited to breakdown of skin (CMS/HHS-HCC)          Future Appointments     Date/Time Provider Department Center Visit  Type   05/24/2024 8:45 AM [facility]       There are no Patient Instructions on file for this visit.   This note has been created using automated tools and reviewed for accuracy by JUSTIN ALLEN FOWLER.

## 2023-12-30 ENCOUNTER — Inpatient Hospital Stay

## 2023-12-30 VITALS — BP 90/50

## 2023-12-30 DIAGNOSIS — D631 Anemia in chronic kidney disease: Secondary | ICD-10-CM

## 2023-12-30 DIAGNOSIS — N185 Chronic kidney disease, stage 5: Secondary | ICD-10-CM | POA: Diagnosis not present

## 2023-12-30 LAB — HEMOGLOBIN AND HEMATOCRIT, BLOOD
HCT: 29.2 % — ABNORMAL LOW (ref 39.0–52.0)
Hemoglobin: 9.2 g/dL — ABNORMAL LOW (ref 13.0–17.0)

## 2023-12-30 MED ORDER — EPOETIN ALFA-EPBX 40000 UNIT/ML IJ SOLN
40000.0000 [IU] | Freq: Once | INTRAMUSCULAR | Status: AC
  Administered 2023-12-30: 40000 [IU] via SUBCUTANEOUS
  Filled 2023-12-30: qty 1

## 2023-12-30 NOTE — Progress Notes (Signed)
 Patient has low bp today with slight increase in fatigue.   Discussed with Dr. Babara and she recommends patient to continue monitoring home bp.  If hypotension is consistent he should contact PCP.

## 2024-01-08 ENCOUNTER — Telehealth: Payer: Self-pay

## 2024-01-08 NOTE — Telephone Encounter (Signed)
-----   Message from Zelphia Cap sent at 01/08/2024 11:18 AM EDT ----- We can cancel all future appointments. ----- Message ----- From: Douglas Rule, MD Sent: 01/07/2024  12:20 PM EDT To: Zelphia Cap, MD  Patient will be getting Mircera at the dialysis center now that he has started dialysis. He will not need ESA or iron  through the cancer center. Thank you.

## 2024-01-27 ENCOUNTER — Ambulatory Visit

## 2024-01-27 ENCOUNTER — Other Ambulatory Visit

## 2024-02-24 ENCOUNTER — Other Ambulatory Visit

## 2024-02-24 ENCOUNTER — Ambulatory Visit

## 2024-04-11 ENCOUNTER — Other Ambulatory Visit

## 2024-04-13 ENCOUNTER — Ambulatory Visit

## 2024-04-13 ENCOUNTER — Other Ambulatory Visit (INDEPENDENT_AMBULATORY_CARE_PROVIDER_SITE_OTHER): Payer: Self-pay | Admitting: Vascular Surgery

## 2024-04-13 ENCOUNTER — Ambulatory Visit: Admitting: Oncology

## 2024-04-13 DIAGNOSIS — Z9889 Other specified postprocedural states: Secondary | ICD-10-CM

## 2024-04-22 ENCOUNTER — Telehealth (INDEPENDENT_AMBULATORY_CARE_PROVIDER_SITE_OTHER): Payer: Self-pay | Admitting: Vascular Surgery

## 2024-04-22 ENCOUNTER — Ambulatory Visit (INDEPENDENT_AMBULATORY_CARE_PROVIDER_SITE_OTHER): Admitting: Vascular Surgery

## 2024-04-22 ENCOUNTER — Ambulatory Visit (INDEPENDENT_AMBULATORY_CARE_PROVIDER_SITE_OTHER)

## 2024-04-22 ENCOUNTER — Encounter (INDEPENDENT_AMBULATORY_CARE_PROVIDER_SITE_OTHER): Payer: Self-pay | Admitting: Nurse Practitioner

## 2024-04-22 VITALS — BP 122/70 | HR 94 | Resp 18

## 2024-04-22 DIAGNOSIS — I7025 Atherosclerosis of native arteries of other extremities with ulceration: Secondary | ICD-10-CM

## 2024-04-22 DIAGNOSIS — I10 Essential (primary) hypertension: Secondary | ICD-10-CM

## 2024-04-22 DIAGNOSIS — I739 Peripheral vascular disease, unspecified: Secondary | ICD-10-CM | POA: Diagnosis not present

## 2024-04-22 DIAGNOSIS — Z9889 Other specified postprocedural states: Secondary | ICD-10-CM | POA: Diagnosis not present

## 2024-04-22 DIAGNOSIS — E782 Mixed hyperlipidemia: Secondary | ICD-10-CM

## 2024-04-22 NOTE — Telephone Encounter (Signed)
 LVM for pt to CB to schedule at appointment at [facility]  Fu 8 weeks + ABI see JD

## 2024-04-25 LAB — VAS US ABI WITH/WO TBI
Left ABI: 1.1
Right ABI: 1.14

## 2024-05-01 ENCOUNTER — Encounter (INDEPENDENT_AMBULATORY_CARE_PROVIDER_SITE_OTHER): Payer: Self-pay | Admitting: Nurse Practitioner

## 2024-05-01 NOTE — Progress Notes (Signed)
 "  Subjective:    Patient ID: Michael Holloway, male    DOB: [birth year], 89 y.o.   MRN: [id number] Chief Complaint  Patient presents with   Follow-up    6 month abi follow up    HPI  Discussed the use of AI scribe software for clinical note transcription with the patient, who gave verbal consent to proceed.  History of Present Illness Michael Holloway is a 89 year old male with peripheral neuropathy and stage 5 kidney disease who presents with a non-healing wound on the second toe of the right foot. He was referred by Dr. Fernande for evaluation of circulation in the right leg.  He has a non-healing wound on the second toe of his right foot, present for a few weeks. Initially, it appeared as a scrape that bled and has since opened to the bone. Due to peripheral neuropathy, he does not feel pain in his feet, leading to frequent unnoticed injuries. He often hits his feet without realizing it, resulting in wounds that he only notices later.  He has a long-standing history of peripheral neuropathy, resulting in a lack of sensation in his feet. This condition contributes to unnoticed injuries and wounds, such as the current one on his right second toe.  He has a significant history of kidney disease, diagnosed with stage 3 kidney failure approximately 20 years ago. Following a stent procedure in November 2024, his condition progressed to stage 5 kidney disease. He attributes this progression to medications prescribed for neck pain and the dye used during the stent procedure. Recent blood work shows a creatinine level of 3.93.  He was prescribed medications for neck pain, which he took for three days, but experienced adverse effects, including passing out, which he believes contributed to the worsening of his kidney function.    Results LABS Creatinine: 3.93  DIAGNOSTIC Ankle blood flow: 1.14 (04/22/2024) Toe blood flow: 0.89 (04/22/2024)   Review of Systems  Skin:  Positive for wound.   Neurological:  Positive for weakness.  All other systems reviewed and are negative.      Objective:   Physical Exam Vitals reviewed.  HENT:     Head: Normocephalic.  Cardiovascular:     Rate and Rhythm: Normal rate.     Pulses:          Dorsalis pedis pulses are detected w/ Doppler on the right side and detected w/ Doppler on the left side.       Posterior tibial pulses are detected w/ Doppler on the right side and detected w/ Doppler on the left side.  Pulmonary:     Effort: Pulmonary effort is normal.  Skin:    General: Skin is warm and dry.  Neurological:     Mental Status: He is alert and oriented to person, place, and time.  Psychiatric:        Mood and Affect: Mood normal.        Behavior: Behavior normal.        Thought Content: Thought content normal.        Judgment: Judgment normal.     Physical Exam EXTREMITIES: Right leg with good blood flow; ankle ABI 1.14, toe ABI 0.89.  BP 122/70 (BP Location: Right Arm)   Pulse 94   Resp 18   Past Medical History:  Diagnosis Date   Allergy    Arthritis    Bladder cancer (HCC)    Cancer (HCC)    Basal Cell Skin Cancer  Cataract    Chronic kidney disease    Stage 3 CKD   Dysrhythmia    GERD (gastroesophageal reflux disease)    Gout    History of kidney stones    Hypertension    Hypotension 05/24/2020   Neuropathy    Presence of permanent cardiac pacemaker 2020   Spinal stenosis     Social History   Socioeconomic History   Marital status: Widowed    Spouse name: Not on file   Number of children: Not on file   Years of education: Not on file   Highest education level: Not on file  Occupational History   Occupation: industrial engineering/ management  Tobacco Use   Smoking status: Former    Current packs/day: 0.00    Average packs/day: 0.5 packs/day for 30.0 years (15.0 ttl pk-yrs)    Types: Cigarettes    Start date: 08/10/1948    Quit date: 08/11/1978    Years since quitting: 45.7    Passive  exposure: Past   Smokeless tobacco: Never  Vaping Use   Vaping status: Never Used  Substance and Sexual Activity   Alcohol use: Not Currently    Comment: 1 BEER DAILY   Drug use: No   Sexual activity: Not Currently  Other Topics Concern   Not on file  Social History Narrative   Patient lives with his son, Michael Holloway.  He feels safe in his home.   Very active. Goes to the [employer] regularly, works on his computer and putters around his home.   Independant   Social Drivers of Health   Tobacco Use: Medium Risk (04/22/2024)   Patient History    Smoking Tobacco Use: Former    Smokeless Tobacco Use: Never    Passive Exposure: Past  Physicist, Medical Strain: Patient Declined (11/30/2023)   Received from [facility]   Overall Financial Resource Strain (CARDIA)    How hard is it for you to pay for the very basics like food, housing, medical care, and heating?: Patient declined  Food Insecurity: No Food Insecurity (12/09/2023)   Epic    Worried About Programme Researcher, Broadcasting/film/video in the Last Year: Never true    Ran Out of Food in the Last Year: Never true  Transportation Needs: No Transportation Needs (12/09/2023)   Epic    Lack of Transportation (Medical): No    Lack of Transportation (Non-Medical): No  Physical Activity: Not on file  Stress: No Stress Concern Present (11/16/2023)   Received from [facility] - Occupational Stress Questionnaire    Feeling of Stress : Not at all  Social Connections: Moderately Integrated (12/09/2023)   Social Connection and Isolation Panel    Frequency of Communication with Friends and Family: Three times a week    Frequency of Social Gatherings with Friends and Family: Twice a week    Attends Religious Services: More than 4 times per year    Active Member of [employer] or Organizations: Yes    Attends Banker Meetings: More than 4 times per year    Marital Status: Widowed  Intimate Partner Violence: Not At Risk  (12/09/2023)   Epic    Fear of Current or Ex-Partner: No    Emotionally Abused: No    Physically Abused: No    Sexually Abused: No  Depression (PHQ2-9): Low Risk (12/02/2023)   Depression (PHQ2-9)    PHQ-2 Score: 0  Alcohol Screen: Not on file  Housing: Low Risk  (12/29/2023)  Received from [facility]   Epic    In the last 12 months, was there a time when you were not able to pay the mortgage or rent on time?: No    In the past 12 months, how many times have you moved where you were living?: 0    At any time in the past 12 months, were you homeless or living in a shelter (including now)?: No  Utilities: Not At Risk (12/09/2023)   Epic    Threatened with loss of utilities: No  Health Literacy: Not on file    Past Surgical History:  Procedure Laterality Date   ACHILLES TENDON LENGTHENING Left 12/11/2023   Procedure: LENGTHENING, TENDON, ACHILLES;  Surgeon: Ashley Soulier, DPM;  Location: [facility];  Service: Orthopedics/Podiatry;  Laterality: Left;   AMPUTATION Left 03/28/2023   Procedure: PARTIAL 5TH RAY AMPUTATION;  Surgeon: Lennie Barter, DPM;  Location: [facility];  Service: Orthopedics/Podiatry;  Laterality: Left;   BACK SURGERY     CERVICAL SPINE SURGERY      X 2   CHOLECYSTECTOMY     CYSTOSCOPY W/ RETROGRADES Bilateral 08/25/2016   Procedure: CYSTOSCOPY WITH RETROGRADE PYELOGRAM;  Surgeon: Rosina Riis, MD;  Location: [facility];  Service: Urology;  Laterality: Bilateral;   CYSTOSCOPY W/ RETROGRADES Bilateral 07/18/2019   Procedure: CYSTOSCOPY WITH RETROGRADE PYELOGRAM;  Surgeon: Riis Rosina, MD;  Location: [facility];  Service: Urology;  Laterality: Bilateral;   CYSTOSCOPY W/ RETROGRADES Bilateral 03/26/2020   Procedure: CYSTOSCOPY WITH RETROGRADE PYELOGRAM;  Surgeon: Riis Rosina, MD;  Location: [facility];  Service: Urology;  Laterality: Bilateral;   EYE SURGERY Bilateral    Cataract Extraction with IOL   JOINT REPLACEMENT Bilateral    total knee  replacements   LOOP RECORDER INSERTION N/A 06/03/2017   Procedure: LOOP RECORDER INSERTION;  Surgeon: Ammon Blunt, MD;  Location: [facility];  Service: Cardiovascular;  Laterality: N/A;   LOWER EXTREMITY ANGIOGRAPHY Left 03/27/2023   Procedure: Lower Extremity Angiography;  Surgeon: Marea Selinda RAMAN, MD;  Location: [facility];  Service: Cardiovascular;  Laterality: Left;   LUMBAR LAMINECTOMY      X 4   PACEMAKER INSERTION Left 08/03/2018   Procedure: INSERTION PACEMAKER DUALCHAMBER;  Surgeon: Ammon Blunt, MD;  Location: [facility];  Service: Cardiovascular;  Laterality: Left;   PROSTATE SURGERY  1990s   Dr. Gala ,in office procedure   TONSILLECTOMY     TOTAL KNEE ARTHROPLASTY Right 09/29/2019   Procedure: TOTAL KNEE ARTHROPLASTY;  Surgeon: Edie Norleen PARAS, MD;  Location: [facility];  Service: Orthopedics;  Laterality: Right;   TRANSMETATARSAL AMPUTATION Left 12/11/2023   Procedure: AMPUTATION, FOOT, TRANSMETATARSAL;  Surgeon: Ashley Soulier, DPM;  Location: [facility];  Service: Orthopedics/Podiatry;  Laterality: Left;  1st Ray AMP   TRANSURETHRAL RESECTION OF BLADDER TUMOR WITH MITOMYCIN -C N/A 08/25/2016   Procedure: TRANSURETHRAL RESECTION OF BLADDER TUMOR WITH MITOMYCIN -C;  Surgeon: Rosina Riis, MD;  Location: [facility];  Service: Urology;  Laterality: N/A;   TRANSURETHRAL RESECTION OF BLADDER TUMOR WITH MITOMYCIN -C N/A 07/18/2019   Procedure: TRANSURETHRAL RESECTION OF BLADDER TUMOR WITH gemcitabine ;  Surgeon: Riis Rosina, MD;  Location: [facility];  Service: Urology;  Laterality: N/A;   TRANSURETHRAL RESECTION OF BLADDER TUMOR WITH MITOMYCIN -C N/A 03/26/2020   Procedure: TRANSURETHRAL RESECTION OF BLADDER TUMOR WITH gemcitabine ;  Surgeon: Riis Rosina, MD;  Location: [facility];  Service: Urology;  Laterality: N/A;   TRANSURETHRAL RESECTION OF PROSTATE N/A 06/14/2018   Procedure: TRANSURETHRAL RESECTION OF THE PROSTATE (  TURP) with Gemcitabine ;  Surgeon: Penne Knee, MD;  Location: [facility];  Service: Urology;  Laterality: N/A;    Family History  Problem Relation Age of Onset   Cancer Mother    Stomach cancer Mother    Stroke Father    Bladder Cancer Neg Hx    Kidney cancer Neg Hx    Prostate cancer Neg Hx     Allergies[1]     Latest Ref Rng & Units 12/30/2023   10:56 AM 12/12/2023    3:57 AM 12/10/2023    3:52 AM  CBC  WBC 4.0 - 10.5 K/uL  7.9  6.3   Hemoglobin 13.0 - 17.0 g/dL 9.2  9.5  8.7   Hematocrit 39.0 - 52.0 % 29.2  30.6  28.3   Platelets 150 - 400 K/uL  212  193       CMP     Component Value Date/Time   NA 142 12/12/2023 0357   K 4.0 12/12/2023 0357   CL 110 12/12/2023 0357   CO2 20 (L) 12/12/2023 0357   GLUCOSE 82 12/12/2023 0357   BUN 59 (H) 12/12/2023 0357   CREATININE 5.65 (H) 12/12/2023 0357   CALCIUM  8.5 (L) 12/12/2023 0357   PROT 6.2 (L) 12/09/2023 1136   ALBUMIN 3.1 (L) 12/09/2023 1136   AST 16 12/09/2023 1136   ALT 7 12/09/2023 1136   ALKPHOS 70 12/09/2023 1136   BILITOT 0.6 12/09/2023 1136   GFRNONAA 9 (L) 12/12/2023 0357     VAS US  ABI WITH/WO TBI Result Date: 04/25/2024  LOWER EXTREMITY DOPPLER STUDY Patient Name:  IVERSON SEES  Date of Exam:   04/22/2024 Medical Rec #: [id number]        Accession #:    [id number] Date of Birth: Nov 06, 1928        Patient Gender: M Patient Age:   57 years [facility]am [facility] Procedure:      VAS US  ABI WITH/WO TBI Referring Phys: Selinda Gu --------------------------------------------------------------------------------  Indications: Ulceration, and peripheral artery disease. High Risk Factors: Hypertension, hyperlipidemia.  Vascular Interventions: 03/27/2023 PTA left EIA, PTA, ATA and left EIA stent.                          12/11/2023: Toes Amputated (Left). Comparison Study: 10/23/2023 Performing Technologist: Leafy Gibes RVS  Examination Guidelines: A complete evaluation includes at minimum, Doppler waveform signals and systolic blood  pressure reading at the level of bilateral brachial, anterior tibial, and posterior tibial arteries, when vessel segments are accessible. Bilateral testing is considered an integral part of a complete examination. Photoelectric Plethysmograph (PPG) waveforms and toe systolic pressure readings are included as required and additional duplex testing as needed. Limited examinations for reoccurring indications may be performed as noted.  ABI Findings: +---------+------------------+-----+---------+--------+ Right    Rt Pressure (mmHg)IndexWaveform Comment  +---------+------------------+-----+---------+--------+ Brachial 150                                      +---------+------------------+-----+---------+--------+ ATA      143               0.89 biphasic          +---------+------------------+-----+---------+--------+ PTA      183               1.14 triphasic         +---------+------------------+-----+---------+--------+  Great Toe142               0.89                   +---------+------------------+-----+---------+--------+ +---------+------------------+-----+--------+-------------+ Left     Lt Pressure (mmHg)IndexWaveformComment       +---------+------------------+-----+--------+-------------+ Brachial 160                                          +---------+------------------+-----+--------+-------------+ ATA      161               1.01                       +---------+------------------+-----+--------+-------------+ PTA      176               1.10                       +---------+------------------+-----+--------+-------------+ Great Toe                               Toe Amputated +---------+------------------+-----+--------+-------------+ +-------+-----------+--------------+------------+------------+ ABI/TBIToday's ABIToday's TBI   Previous ABIPrevious TBI +-------+-----------+--------------+------------+------------+ Right  1.14       .89            1.12        .84          +-------+-----------+--------------+------------+------------+ Left   1.10       Toes Amputated1.06        .68          +-------+-----------+--------------+------------+------------+ Bilateral ABIs appear essentially unchanged compared to prior study on 10/23/2023. Right TBIs appear increased compared to prior study on 10/23/2023.  Summary: Right: Resting right ankle-brachial index is within normal range. The right toe-brachial index is normal.  Left: Resting left ankle-brachial index is within normal range.  Toes Amputated. *See table(s) above for measurements and observations.  Electronically signed by Selinda Gu MD on 04/25/2024 at 8:26:41 AM.    Final        Assessment & Plan:   1. Atherosclerosis of native arteries of the extremities with ulceration (HCC) (Primary) Chronic ulcer with exposed bone and underlying hammer toe deformity. Adequate blood flow for healing. Peripheral neuropathy contributes to ulcer formation. Conservative surgical approach due to renal function concerns. Previous stent in left leg. Stage 5 renal function with creatinine at 3.93 affects invasive procedure decisions. - Proceed with planned amputation of the right second toe. - Monitor healing post-amputation closely. - Avoid angiography unless absolutely necessary due to renal function con  2. Benign essential hypertension Continue antihypertensive medications as already ordered, these medications have been reviewed and there are no changes at this time.  3. Mixed hyperlipidemia Continue statin as ordered and reviewed, no changes at this time   Medications Ordered Prior to Encounter[2]  There are no Patient Instructions on file for this visit. Return for 8 weeks ABI see JD .   Tysheena Ginzburg E Brenon Antosh, NP      [1]  Allergies Allergen Reactions   Sulfa Antibiotics Rash  [2]  Current Outpatient Medications on File Prior to Visit  Medication Sig Dispense Refill   acetaminophen   (TYLENOL ) 500 MG tablet Take 500 mg by mouth at bedtime.     amoxicillin -clavulanate (AUGMENTIN ) 875-125 MG tablet Take 875 mg  by mouth 2 (two) times daily.     aspirin  EC 81 MG tablet Take 81 mg by mouth daily.     gabapentin  (NEURONTIN ) 600 MG tablet Take 300 mg by mouth 3 (three) times daily.     latanoprost  (XALATAN ) 0.005 % ophthalmic solution Place 1 drop into the right eye at bedtime.     Multiple Vitamins-Minerals (PRESERVISION AREDS 2 PO) Take 1 tablet by mouth daily.     calcitRIOL  (ROCALTROL ) 0.25 MCG capsule Take 0.25 mcg by mouth daily. (Patient not taking: Reported on 04/22/2024)     gentamicin  cream (GARAMYCIN ) 0.1 % Apply 1 Application topically daily. (Patient not taking: Reported on 04/22/2024)     HYDROcodone -acetaminophen  (NORCO/VICODIN) 5-325 MG tablet Take 1 tablet by mouth every 4 (four) hours as needed for moderate pain (pain score 4-6). (Patient not taking: Reported on 04/22/2024)     methocarbamol (ROBAXIN) 500 MG tablet Take 500 mg by mouth 3 (three) times daily. (Patient not taking: Reported on 04/22/2024)     Multiple Vitamins-Minerals (CENTRUM SILVER PO) Take 1 tablet by mouth daily. (Patient not taking: Reported on 04/22/2024)     ondansetron  (ZOFRAN ) 4 MG tablet Take 4 mg by mouth every 6 (six) hours as needed for nausea. (Patient not taking: Reported on 04/22/2024)     sodium bicarbonate  650 MG tablet Take 1 tablet (650 mg total) by mouth 3 (three) times daily. (Patient not taking: Reported on 04/22/2024) 90 tablet 11   No current facility-administered medications on file prior to visit.   "
